# Patient Record
Sex: Male | Born: 1960
Health system: Southern US, Community
[De-identification: ages and names within clinical notes are randomized; demographics above are authoritative.]

## PROBLEM LIST (undated history)

## (undated) DIAGNOSIS — C801 Malignant (primary) neoplasm, unspecified: Secondary | ICD-10-CM

## (undated) DIAGNOSIS — A419 Sepsis, unspecified organism: Secondary | ICD-10-CM

## (undated) DIAGNOSIS — J189 Pneumonia, unspecified organism: Secondary | ICD-10-CM

## (undated) DIAGNOSIS — C9000 Multiple myeloma not having achieved remission: Secondary | ICD-10-CM

## (undated) DIAGNOSIS — K7460 Unspecified cirrhosis of liver: Secondary | ICD-10-CM

## (undated) DIAGNOSIS — C9001 Multiple myeloma in remission: Secondary | ICD-10-CM

## (undated) DIAGNOSIS — Z9484 Stem cells transplant status: Secondary | ICD-10-CM

## (undated) DIAGNOSIS — I10 Essential (primary) hypertension: Secondary | ICD-10-CM

## (undated) DIAGNOSIS — E538 Deficiency of other specified B group vitamins: Secondary | ICD-10-CM

## (undated) DIAGNOSIS — E119 Type 2 diabetes mellitus without complications: Secondary | ICD-10-CM

## (undated) HISTORY — PX: NOSE SURGERY: SHX723

## (undated) HISTORY — DX: Deficiency of other specified B group vitamins: E53.8

## (undated) HISTORY — DX: Essential (primary) hypertension: I10

## (undated) HISTORY — PX: LIMBAL STEM CELL TRANSPLANT: SHX1969

## (undated) HISTORY — DX: Multiple myeloma in remission: C90.01

---

## 2002-10-24 ENCOUNTER — Encounter: Payer: Self-pay | Admitting: Emergency Medicine

## 2002-10-24 ENCOUNTER — Emergency Department (HOSPITAL_COMMUNITY): Admission: EM | Admit: 2002-10-24 | Discharge: 2002-10-24 | Payer: Self-pay | Admitting: Emergency Medicine

## 2011-03-30 ENCOUNTER — Emergency Department (HOSPITAL_COMMUNITY)
Admission: EM | Admit: 2011-03-30 | Discharge: 2011-03-31 | Disposition: A | Discharge status: Home / Self Care | Payer: Self-pay | Attending: Emergency Medicine | Admitting: Emergency Medicine

## 2011-03-30 ENCOUNTER — Encounter (HOSPITAL_COMMUNITY): Payer: Self-pay

## 2011-03-30 DIAGNOSIS — J3489 Other specified disorders of nose and nasal sinuses: Secondary | ICD-10-CM | POA: Insufficient documentation

## 2011-03-30 DIAGNOSIS — J329 Chronic sinusitis, unspecified: Secondary | ICD-10-CM | POA: Insufficient documentation

## 2011-03-30 NOTE — ED Notes (Signed)
Pt presents with nasal congestion and sinus pressure since Sunday. Pt also states nose was bleeding on Tuesday. No bleeding currently.

## 2011-03-31 MED ORDER — SULFAMETHOXAZOLE-TRIMETHOPRIM 800-160 MG PO TABS: 1.0000 | ORAL_TABLET | Freq: Two times a day (BID) | ORAL | Status: AC

## 2011-03-31 NOTE — ED Provider Notes (Deleted)
History     CSN: [id number]  Arrival date & time 03/30/11  2038   First MD Initiated Contact with Patient 03/30/11 2353      Chief Complaint  Patient presents with  . Nasal Congestion    (Consider location/radiation/quality/duration/timing/severity/associated sxs/prior treatment) The history is provided by the patient.  the patient is a 51 year old male, with no significant past medical history, who presents emergency department complaining of sinus pressure, and congestion.  He denies sore throat.  He has not had a fever.  He has not had a rash.  He denies nausea, vomiting, or cough.  History reviewed. No pertinent past medical history.  History reviewed. No pertinent past surgical history.  No family history on file.  History  Substance Use Topics  . Smoking status: Never Smoker   . Smokeless tobacco: Not on file  . Alcohol Use: Yes     occasional      Review of Systems  Constitutional: Negative for fever and chills.  HENT: Positive for congestion and sinus pressure. Negative for ear pain, sore throat, trouble swallowing, neck pain and voice change.   Respiratory: Negative for cough and shortness of breath.   Gastrointestinal: Negative for nausea and vomiting.  Neurological: Positive for headaches.  Psychiatric/Behavioral: Negative for confusion.  All other systems reviewed and are negative.    Allergies  Review of patient's allergies indicates no known allergies.  Home Medications   Current Outpatient Rx  Name Route Sig Dispense Refill  . PSEUDOEPHEDRINE-ACETAMINOPHEN 30-500 MG PO TABS Oral Take 2 tablets by mouth as needed. For cold symptoms    . SULFAMETHOXAZOLE-TRIMETHOPRIM 800-160 MG PO TABS Oral Take 1 tablet by mouth 2 (two) times daily. 20 tablet 0    BP 158/96  Pulse 96  Temp(Src) 98.2 F (36.8 C) (Oral)  Resp 20  Ht 5\' 9"  (1.753 m)  Wt 226 lb (102.513 kg)  BMI 33.37 kg/m2  SpO2 99%  Physical Exam  Vitals reviewed. Constitutional: He is  oriented to person, place, and time. He appears well-developed and well-nourished.  HENT:  Head: Normocephalic and atraumatic.  Mouth/Throat: Oropharynx is clear and moist.       Bilateral maxillary sinus, tenderness  Eyes: Conjunctivae are normal. Pupils are equal, round, and reactive to light.  Neck: Normal range of motion. Neck supple.  Pulmonary/Chest: Effort normal.  Musculoskeletal: Normal range of motion.  Neurological: He is alert and oriented to person, place, and time.  Skin: Skin is warm and dry.  Psychiatric: He has a normal mood and affect.    ED Course  Procedures (including critical care time)  Labs Reviewed - No data to display No results found.   1. Sinusitis       MDM  sinusitis       Cheri Guppy, MD 03/31/11 4098  Cheri Guppy, MD 03/31/11 1191  Cheri Guppy, MD 03/31/11 [phone]

## 2011-03-31 NOTE — ED Provider Notes (Signed)
done  Cheri Guppy, MD 03/31/11 [phone]

## 2011-03-31 NOTE — ED Notes (Signed)
Pt reports sinus pressure since Sunday. Pt has had runny nose, & has noticed some blood when blowing nose. Denies any fever or chest congestion.

## 2011-03-31 NOTE — ED Notes (Signed)
Pt alert & oriented x4, stable gait. Pt given discharge instructions, paperwork & prescription(s). Patient instructed to stop at the registration desk to finish any additional paperwork. pt verbalized understanding. Pt left department w/ no further questions.  

## 2011-05-17 ENCOUNTER — Emergency Department (HOSPITAL_COMMUNITY): Payer: Self-pay

## 2011-05-17 ENCOUNTER — Encounter (HOSPITAL_COMMUNITY): Payer: Self-pay | Admitting: Emergency Medicine

## 2011-05-17 ENCOUNTER — Emergency Department (HOSPITAL_COMMUNITY)
Admission: EM | Admit: 2011-05-17 | Discharge: 2011-05-17 | Disposition: A | Discharge status: Home / Self Care | Payer: Self-pay | Attending: Emergency Medicine | Admitting: Emergency Medicine

## 2011-05-17 DIAGNOSIS — M546 Pain in thoracic spine: Secondary | ICD-10-CM | POA: Insufficient documentation

## 2011-05-17 DIAGNOSIS — E119 Type 2 diabetes mellitus without complications: Secondary | ICD-10-CM | POA: Insufficient documentation

## 2011-05-17 DIAGNOSIS — R079 Chest pain, unspecified: Secondary | ICD-10-CM | POA: Insufficient documentation

## 2011-05-17 DIAGNOSIS — R350 Frequency of micturition: Secondary | ICD-10-CM | POA: Insufficient documentation

## 2011-05-17 DIAGNOSIS — R1011 Right upper quadrant pain: Secondary | ICD-10-CM | POA: Insufficient documentation

## 2011-05-17 DIAGNOSIS — R634 Abnormal weight loss: Secondary | ICD-10-CM | POA: Insufficient documentation

## 2011-05-17 HISTORY — DX: Pneumonia, unspecified organism: J18.9

## 2011-05-17 HISTORY — DX: Sepsis, unspecified organism: A41.9

## 2011-05-17 LAB — COMPREHENSIVE METABOLIC PANEL
ALT: 20 U/L (ref 0–53)
AST: 19 U/L (ref 0–37)
Albumin: 2.4 g/dL — ABNORMAL LOW (ref 3.5–5.2)
Alkaline Phosphatase: 122 U/L — ABNORMAL HIGH (ref 39–117)
BUN: 20 mg/dL (ref 6–23)
Chloride: 94 mEq/L — ABNORMAL LOW (ref 96–112)
Potassium: 3.7 mEq/L (ref 3.5–5.1)
Sodium: 126 mEq/L — ABNORMAL LOW (ref 135–145)
Total Bilirubin: 0.2 mg/dL — ABNORMAL LOW (ref 0.3–1.2)

## 2011-05-17 LAB — DIFFERENTIAL
Basophils Absolute: 0 10*3/uL (ref 0.0–0.1)
Basophils Relative: 0 % (ref 0–1)
Monocytes Relative: 10 % (ref 3–12)
Neutro Abs: 2.9 10*3/uL (ref 1.7–7.7)
Neutrophils Relative %: 50 % (ref 43–77)

## 2011-05-17 LAB — URINALYSIS, ROUTINE W REFLEX MICROSCOPIC
Bilirubin Urine: NEGATIVE
Glucose, UA: >1000 mg/dL — AB
Ketones, ur: NEGATIVE mg/dL
Specific Gravity, Urine: 1.01 (ref 1.005–1.030)
pH: 5.5 (ref 5.0–8.0)

## 2011-05-17 LAB — CBC
Hemoglobin: 9.1 g/dL — ABNORMAL LOW (ref 13.0–17.0)
MCHC: 33.8 g/dL (ref 30.0–36.0)
Platelets: 247 10*3/uL (ref 150–400)
RBC: 2.9 MIL/uL — ABNORMAL LOW (ref 4.22–5.81)

## 2011-05-17 MED ORDER — METFORMIN HCL 500 MG PO TABS: 500.0000 mg | ORAL_TABLET | Freq: Two times a day (BID) | ORAL | Status: DC

## 2011-05-17 MED ORDER — OXYCODONE-ACETAMINOPHEN 5-325 MG PO TABS: 1.0000 | ORAL_TABLET | Freq: Four times a day (QID) | ORAL | Status: AC | PRN

## 2011-05-17 MED ORDER — ONDANSETRON HCL 8 MG PO TABS: 8.0000 mg | ORAL_TABLET | ORAL | Status: AC | PRN

## 2011-05-17 NOTE — ED Notes (Signed)
Dr. Cook in room talking with pt and family  

## 2011-05-17 NOTE — ED Notes (Addendum)
Pt c/o right rib cage pain that increases with movement and respirations, admits to nausea at times, lower back pain and "foam" looking urine and weight loss.  Pt unsure of when the symptoms started. States "it has been awhile"

## 2011-05-17 NOTE — ED Notes (Signed)
Discharge instructions given, verbalized understanding

## 2011-05-17 NOTE — ED Notes (Addendum)
Pt requesting to be weighed, family concerned with amount of weight loss that pt has had since last visit to the er in march, pt previous weight was 226 weight today was 208.2

## 2011-05-17 NOTE — ED Notes (Signed)
Patient with c/o right upper side/rib pain and lower back pain x 2 weeks. Pain increases in ribs with inspiration.

## 2011-05-17 NOTE — Discharge Instructions (Signed)
Blood Sugar Monitoring, Adult GLUCOSE METERS FOR SELF-MONITORING OF BLOOD GLUCOSE  It is important to be able to correctly measure your blood sugar (glucose). You can use a blood glucose monitor (a small battery-operated device) to check your glucose level at any time. This allows you and your caregiver to monitor your diabetes and to determine how well your treatment plan is working. The process of monitoring your blood glucose with a glucose meter is called self-monitoring of blood glucose (SMBG). When people with diabetes control their blood sugar, they have better health. To test for glucose with a typical glucose meter, place the disposable strip in the meter. Then place a small sample of blood on the "test strip." The test strip is coated with chemicals that combine with glucose in blood. The meter measures how much glucose is present. The meter displays the glucose level as a number. Several new models can record and store a number of test results. Some models can connect to personal computers to store test results or print them out.  Newer meters are often easier to use than older models. Some meters allow you to get blood from places other than your fingertip. Some new models have automatic timing, error codes, signals, or barcode readers to help with proper adjustment (calibration). Some meters have a large display screen or spoken instructions for people with visual impairments.  INSTRUCTIONS FOR USING GLUCOSE METERS  Wash your hands with soap and warm water, or clean the area with alcohol. Dry your hands completely.   Prick the side of your fingertip with a lancet (a sharp-pointed tool used by hand).   Hold the hand down and gently milk the finger until a small drop of blood appears. Catch the blood with the test strip.   Follow the instructions for inserting the test strip and using the SMBG meter. Most meters require the meter to be turned on and the test strip to be inserted before  applying the blood sample.   Record the test result.   Read the instructions carefully for both the meter and the test strips that go with it. Meter instructions are found in the user manual. Keep this manual to help you solve any problems that may arise. Many meters use "error codes" when there is a problem with the meter, the test strip, or the blood sample on the strip. You will need the manual to understand these error codes and fix the problem.   New devices are available such as laser lancets and meters that can test blood taken from "alternative sites" of the body, other than fingertips. However, you should use standard fingertip testing if your glucose changes rapidly. Also, use standard testing if:   You have eaten, exercised, or taken insulin in the past 2 hours.   You think your glucose is low.   You tend to not feel symptoms of low blood glucose (hypoglycemia).   You are ill or under stress.   Clean the meter as directed by the manufacturer.   Test the meter for accuracy as directed by the manufacturer.   Take your meter with you to your caregiver's office. This way, you can test your glucose in front of your caregiver to make sure you are using the meter correctly. Your caregiver can also take a sample of blood to test using a routine lab method. If values on the glucose meter are close to the lab results, you and your caregiver will see that your meter is working well  and you are using good technique. Your caregiver will advise you about what to do if the results do not match.  FREQUENCY OF TESTING  Your caregiver will tell you how often you should check your blood glucose. This will depend on your type of diabetes, your current level of diabetes control, and your types of medicines. The following are general guidelines, but your care plan may be different. Record all your readings and the time of day you took them for review with your caregiver.   Diabetes type 1.   When you  are using insulin with good diabetic control (either multiple daily injections or via a pump), you should check your glucose 4 times a day.   If your diabetes is not well controlled, you may need to monitor more frequently, including before meals and 2 hours after meals, at bedtime, and occasionally between 2 a.m. and 3 a.m.   You should always check your glucose before a dose of insulin or before changing the rate on your insulin pump.   Diabetes type 2.   Guidelines for SMBG in diabetes type 2 are not as well defined.   If you are on insulin, follow the guidelines above.   If you are on medicines, but not insulin, and your glucose is not well controlled, you should test at least twice daily.   If you are not on insulin, and your diabetes is controlled with medicines or diet alone, you should test at least once daily, usually before breakfast.   A weekly profile will help your caregiver advise you on your care plan. The week before your visit, check your glucose before a meal and 2 hours after a meal at least daily. You may want to test before and after a different meal each day so you and your caregiver can tell how well controlled your blood sugars are throughout the course of a 24 hour period.   Gestational diabetes (diabetes during pregnancy).   Frequent testing is often necessary. Accurate timing is important.   If you are not on insulin, check your glucose 4 times a day. Check it before breakfast and 1 hour after the start of each meal.   If you are on insulin, check your glucose 6 times a day. Check it before each meal and 1 hour after the first bite of each meal.   General guidelines.   More frequent testing is required at the start of insulin treatment. Your caregiver will instruct you.   Test your glucose any time you suspect you have low blood sugar (hypoglycemia).   You should test more often when you change medicines, when you have unusual stress or illness, or in other  unusual circumstances.  OTHER THINGS TO KNOW ABOUT GLUCOSE METERS  Measurement Range. Most glucose meters are able to read glucose levels over a broad range of values from as low as 0 to as high as 600 mg/dL. If you get an extremely high or low reading from your meter, you should first confirm it with another reading. Report very high or very low readings to your caregiver.   Whole Blood Glucose versus Plasma Glucose. Some older home glucose meters measure glucose in your whole blood. In a lab or when using some newer home glucose meters, the glucose is measured in your plasma (one component of blood). The difference can be important. It is important for you and your caregiver to know whether your meter gives its results as "whole blood equivalent" or "plasma  equivalent."   Display of High and Low Glucose Values. Part of learning how to operate a meter is understanding what the meter results mean. Know how high and low glucose concentrations are displayed on your meter.   Factors that Affect Glucose Meter Performance. The accuracy of your test results depends on many factors and varies depending on the brand and type of meter. These factors include:   Low red blood cell count (anemia).   Substances in your blood (such as uric acid, vitamin C, and others).   Environmental factors (temperature, humidity, altitude).   Name-brand versus generic test strips.   Calibration. Make sure your meter is set up properly. It is a good idea to do a calibration test with a control solution recommended by the manufacturer of your meter whenever you begin using a fresh bottle of test strips. This will help verify the accuracy of your meter.   Improperly stored, expired, or defective test strips. Keep your strips in a dry place with the lid on.   Soiled meter.   Inadequate blood sample.  NEW TECHNOLOGIES FOR GLUCOSE TESTING Alternative site testing Some glucose meters allow testing blood from alternative  sites. These include the:  Upper arm.   Forearm.   Base of the thumb.   Thigh.  Sampling blood from alternative sites may be desirable. However, it may have some limitations. Blood in the fingertips show changes in glucose levels more quickly than blood in other parts of the body. This means that alternative site test results may be different from fingertip test results, not because of the meter's ability to test accurately, but because the actual glucose concentration can be different.  Continuous Glucose Monitoring Devices to measure your blood glucose continuously are available, and others are in development. These methods can be more expensive than self-monitoring with a glucose meter. However, it is uncertain how effective and reliable these devices are. Your caregiver will advise you if this approach makes sense for you. IF BLOOD SUGARS ARE CONTROLLED, PEOPLE WITH DIABETES REMAIN HEALTHIER.  SMBG is an important part of the treatment plan of patients with diabetes mellitus. Below are reasons for using SMBG:   It confirms that your glucose is at a specific, healthy level.   It detects hypoglycemia and severe hyperglycemia.   It allows you and your caregiver to make adjustments in response to changes in lifestyle for individuals requiring medicine.   It determines the need for starting insulin therapy in temporary diabetes that happens during pregnancy (gestational diabetes).  Document Released: 01/11/2003 Document Revised: 12/28/2010 Document Reviewe  d: 05/04/2010 [facility] [employer] Patient Information 2012 [place], [place].  Start medication for diabetes. Medication for pain and nausea. You will need to get a primary care relationship to monitor your diabetes. Recommend starting at [facility] department

## 2011-05-17 NOTE — ED Provider Notes (Signed)
History    This chart was scribed for Donnetta Hutching, MD, MD by Smitty Pluck. The patient was seen in room APA12 and the patient's care was started at 9:11AM.   CSN: [id number]  Arrival date & time 05/17/11  0750   First MD Initiated Contact with Patient 05/17/11 [phone]      Chief Complaint  Patient presents with  . Rib Pain   . Back Pain    (Consider location/radiation/quality/duration/timing/severity/associated sxs/prior treatment) The history is provided by the patient.   John Singleton is a 51 y.o. male who presents to the Emergency Department complaining of moderate right chest and RUQ pain radiating to mid back on right side onset 2-3 weeks. Pt reports that the pain is aggravated by inspiration. Pt reports having weight loss of about 40 lbs over the past month. The symptoms have been constant since onset. Pt's daughter reports that pt has increased urinary frequency and that it has appearance of "foam." Pt denies hx of health problems.   Past Medical History  Diagnosis Date  . Pneumonia   . Sepsis     Past Surgical History  Procedure Date  . Nose surgery     "when I was a child"    No family history on file.  History  Substance Use Topics  . Smoking status: Never Smoker   . Smokeless tobacco: Not on file  . Alcohol Use: Yes     occasional      Review of Systems  All other systems reviewed and are negative.   10 Systems reviewed and all are negative for acute change except as noted in the HPI.   Allergies  Review of patient's allergies indicates no known allergies.  Home Medications   Current Outpatient Rx  Name Route Sig Dispense Refill  . PSEUDOEPHEDRINE-ACETAMINOPHEN 30-500 MG PO TABS Oral Take 2 tablets by mouth as needed. For cold symptoms      BP 157/97  Pulse 107  Temp(Src) 98.8 F (37.1 C) (Oral)  Resp 18  Ht 5\' 7"  (1.702 m)  Wt 226 lb (102.513 kg)  BMI 35.40 kg/m2  SpO2 100%  Physical Exam  Nursing note and vitals  reviewed. Constitutional: He is oriented to person, place, and time. He appears well-developed and well-nourished. No distress.  HENT:  Head: Normocephalic and atraumatic.  Eyes: Conjunctivae are normal. Pupils are equal, round, and reactive to light.  Neck: Normal range of motion. No thyromegaly present.  Pulmonary/Chest: Effort normal. No respiratory distress. He exhibits tenderness (Right lower).  Abdominal: Soft. He exhibits no distension. There is tenderness (RUQ).  Neurological: He is alert and oriented to person, place, and time.  Skin: Skin is warm and dry.  Psychiatric: He has a normal mood and affect. His behavior is normal.    ED Course  Procedures (including critical care time) DIAGNOSTIC STUDIES: Oxygen Saturation is 100% on room air, normal by my interpretation.    COORDINATION OF CARE: 9:24AM EDP discusses pt ED treatment course with pt: Blood labs and xray. Possible diabetes mellitus     Labs Reviewed  CBC - Abnormal; Notable for the following:    RBC 2.90 (*)    Hemoglobin 9.1 (*)    HCT 26.9 (*)    All other components within normal limits  COMPREHENSIVE METABOLIC PANEL - Abnormal; Notable for the following:    Sodium 126 (*)    Chloride 94 (*)    Glucose, Bld 362 (*)    Total Protein 9.3 (*)  Albumin 2.4 (*)    Alkaline Phosphatase 122 (*)    Total Bilirubin 0.2 (*)    GFR calc non Af Amer 81 (*)    All other components within normal limits  LIPASE, BLOOD - Abnormal; Notable for the following:    Lipase 80 (*)    All other components within normal limits  URINALYSIS, ROUTINE W REFLEX MICROSCOPIC - Abnormal; Notable for the following:    Glucose, UA >1000 (*)    Hgb urine dipstick TRACE (*)    Protein, ur TRACE (*)    All other components within normal limits  DIFFERENTIAL  URINE MICROSCOPIC-ADD ON   Dg Chest 2 View  05/17/2011  *RADIOLOGY REPORT*  Clinical Data: Weight loss.  Right side abdominal pain.  CHEST - 2 VIEW  Comparison: PA and lateral  chest 03/21/2003.  Findings: Lungs are clear.  Heart size is normal.  No pneumothorax or effusion.  Remote left fourth and fifth rib fractures noted.  IMPRESSION: No acute finding.  Original Report Authenticated By: Bernadene Bell. Maricela Curet, M.D.   US Abdomen Complete  05/17/2011  *RADIOLOGY REPORT*  Clinical Data:  Right upper quadrant pain.  COMPLETE ABDOMINAL ULTRASOUND  Comparison:  None.  Findings:  Gallbladder:  No gallstones, gallbladder wall thickening, or pericholecystic fluid.  Common bile duct:  Measures 0.3 cm.  Liver:  Diffuse fatty infiltration of the liver with focal sparing noted about the gallbladder. The liver measures 13.6 cm. No worrisome lesion or intrahepatic biliary ductal dilatation.  IVC:  Appears normal.  Pancreas:  No focal abnormality seen.  Spleen:  Measures 9.4 cm and appears normal.  Right Kidney:  Measures 13.6 cm and appears normal without stone, mass or hydronephrosis.  Left Kidney:  Measures 13.6 cm and appears normal.  Abdominal aorta:  No aneurysm identified.  IMPRESSION: No acute finding.  Fatty infiltration of the liver and hepatomegaly.  Original Report Authenticated By: Bernadene Bell. Maricela Curet, M.D.     No diagnosis found.    MDM  Patient complains of polyuria, polydipsia, polyphasia, weight loss. Glucose greater than 300. Not in DKA. Elevated lipase also noted. Ultrasound shows fatty liver but no gallbladder pathology. Will start low dose of metformin and referred patient to [facility] department. This was discussed with patient and his family  I personally performed the services described in this documentation, which was scribed in my presence. The recorded information has been reviewed and considered.        Donnetta Hutching, MD 05/17/11 1451

## 2011-09-30 DIAGNOSIS — R079 Chest pain, unspecified: Secondary | ICD-10-CM

## 2011-10-01 DIAGNOSIS — R079 Chest pain, unspecified: Secondary | ICD-10-CM

## 2011-10-05 ENCOUNTER — Encounter: Payer: Self-pay | Admitting: Internal Medicine

## 2011-10-05 DIAGNOSIS — C9000 Multiple myeloma not having achieved remission: Secondary | ICD-10-CM

## 2011-10-05 DIAGNOSIS — Z5112 Encounter for antineoplastic immunotherapy: Secondary | ICD-10-CM

## 2011-10-12 ENCOUNTER — Encounter: Payer: Self-pay | Admitting: Internal Medicine

## 2011-10-12 DIAGNOSIS — Z5112 Encounter for antineoplastic immunotherapy: Secondary | ICD-10-CM

## 2011-10-12 DIAGNOSIS — D649 Anemia, unspecified: Secondary | ICD-10-CM

## 2011-10-12 DIAGNOSIS — N183 Chronic kidney disease, stage 3 (moderate): Secondary | ICD-10-CM

## 2011-10-12 DIAGNOSIS — C9000 Multiple myeloma not having achieved remission: Secondary | ICD-10-CM

## 2011-10-19 DIAGNOSIS — Z5112 Encounter for antineoplastic immunotherapy: Secondary | ICD-10-CM

## 2011-10-19 DIAGNOSIS — C9000 Multiple myeloma not having achieved remission: Secondary | ICD-10-CM

## 2011-10-23 ENCOUNTER — Encounter: Payer: Self-pay | Admitting: Internal Medicine

## 2011-10-23 DIAGNOSIS — G893 Neoplasm related pain (acute) (chronic): Secondary | ICD-10-CM

## 2011-10-23 DIAGNOSIS — C9000 Multiple myeloma not having achieved remission: Secondary | ICD-10-CM

## 2011-10-23 DIAGNOSIS — N189 Chronic kidney disease, unspecified: Secondary | ICD-10-CM

## 2011-11-02 DIAGNOSIS — C9000 Multiple myeloma not having achieved remission: Secondary | ICD-10-CM

## 2011-11-02 DIAGNOSIS — Z5112 Encounter for antineoplastic immunotherapy: Secondary | ICD-10-CM

## 2011-11-09 DIAGNOSIS — Z5112 Encounter for antineoplastic immunotherapy: Secondary | ICD-10-CM

## 2011-11-09 DIAGNOSIS — C9000 Multiple myeloma not having achieved remission: Secondary | ICD-10-CM

## 2011-11-13 DIAGNOSIS — C9000 Multiple myeloma not having achieved remission: Secondary | ICD-10-CM

## 2011-11-13 DIAGNOSIS — D696 Thrombocytopenia, unspecified: Secondary | ICD-10-CM

## 2011-11-13 DIAGNOSIS — Z23 Encounter for immunization: Secondary | ICD-10-CM

## 2011-11-16 DIAGNOSIS — C9000 Multiple myeloma not having achieved remission: Secondary | ICD-10-CM

## 2011-11-16 DIAGNOSIS — Z5112 Encounter for antineoplastic immunotherapy: Secondary | ICD-10-CM

## 2011-12-03 ENCOUNTER — Encounter: Payer: Self-pay | Admitting: Internal Medicine

## 2011-12-03 DIAGNOSIS — Z5112 Encounter for antineoplastic immunotherapy: Secondary | ICD-10-CM

## 2011-12-03 DIAGNOSIS — C9000 Multiple myeloma not having achieved remission: Secondary | ICD-10-CM

## 2011-12-11 DIAGNOSIS — Z5112 Encounter for antineoplastic immunotherapy: Secondary | ICD-10-CM

## 2011-12-11 DIAGNOSIS — C9000 Multiple myeloma not having achieved remission: Secondary | ICD-10-CM

## 2011-12-17 DIAGNOSIS — C9000 Multiple myeloma not having achieved remission: Secondary | ICD-10-CM

## 2011-12-17 DIAGNOSIS — Z5112 Encounter for antineoplastic immunotherapy: Secondary | ICD-10-CM

## 2011-12-24 ENCOUNTER — Encounter: Payer: Self-pay | Admitting: Internal Medicine

## 2011-12-24 DIAGNOSIS — C9000 Multiple myeloma not having achieved remission: Secondary | ICD-10-CM

## 2011-12-24 DIAGNOSIS — Z5112 Encounter for antineoplastic immunotherapy: Secondary | ICD-10-CM

## 2011-12-31 DIAGNOSIS — Z5112 Encounter for antineoplastic immunotherapy: Secondary | ICD-10-CM

## 2011-12-31 DIAGNOSIS — C9000 Multiple myeloma not having achieved remission: Secondary | ICD-10-CM

## 2012-01-08 DIAGNOSIS — C9000 Multiple myeloma not having achieved remission: Secondary | ICD-10-CM

## 2012-01-08 DIAGNOSIS — Z5112 Encounter for antineoplastic immunotherapy: Secondary | ICD-10-CM

## 2012-01-09 ENCOUNTER — Emergency Department (HOSPITAL_COMMUNITY): Payer: Medicaid Other

## 2012-01-09 ENCOUNTER — Emergency Department (HOSPITAL_COMMUNITY)
Admission: EM | Admit: 2012-01-09 | Discharge: 2012-01-09 | Disposition: A | Discharge status: Home / Self Care | Payer: Medicaid Other | Attending: Emergency Medicine | Admitting: Emergency Medicine

## 2012-01-09 ENCOUNTER — Encounter (HOSPITAL_COMMUNITY): Payer: Self-pay | Admitting: *Deleted

## 2012-01-09 DIAGNOSIS — R05 Cough: Secondary | ICD-10-CM | POA: Insufficient documentation

## 2012-01-09 DIAGNOSIS — E119 Type 2 diabetes mellitus without complications: Secondary | ICD-10-CM | POA: Insufficient documentation

## 2012-01-09 DIAGNOSIS — Z8619 Personal history of other infectious and parasitic diseases: Secondary | ICD-10-CM | POA: Insufficient documentation

## 2012-01-09 DIAGNOSIS — Z8701 Personal history of pneumonia (recurrent): Secondary | ICD-10-CM | POA: Insufficient documentation

## 2012-01-09 DIAGNOSIS — R059 Cough, unspecified: Secondary | ICD-10-CM | POA: Insufficient documentation

## 2012-01-09 DIAGNOSIS — Z859 Personal history of malignant neoplasm, unspecified: Secondary | ICD-10-CM | POA: Insufficient documentation

## 2012-01-09 DIAGNOSIS — C9000 Multiple myeloma not having achieved remission: Secondary | ICD-10-CM | POA: Insufficient documentation

## 2012-01-09 DIAGNOSIS — J029 Acute pharyngitis, unspecified: Secondary | ICD-10-CM | POA: Insufficient documentation

## 2012-01-09 DIAGNOSIS — R062 Wheezing: Secondary | ICD-10-CM | POA: Insufficient documentation

## 2012-01-09 DIAGNOSIS — J9801 Acute bronchospasm: Secondary | ICD-10-CM | POA: Insufficient documentation

## 2012-01-09 DIAGNOSIS — Z79899 Other long term (current) drug therapy: Secondary | ICD-10-CM | POA: Insufficient documentation

## 2012-01-09 HISTORY — DX: Multiple myeloma not having achieved remission: C90.00

## 2012-01-09 HISTORY — DX: Malignant (primary) neoplasm, unspecified: C80.1

## 2012-01-09 HISTORY — DX: Type 2 diabetes mellitus without complications: E11.9

## 2012-01-09 MED ORDER — ALBUTEROL SULFATE (5 MG/ML) 0.5% IN NEBU
2.5000 mg | INHALATION_SOLUTION | Freq: Once | RESPIRATORY_TRACT | Status: AC
  Administered 2012-01-09: 2.5 mg via RESPIRATORY_TRACT
  Filled 2012-01-09: qty 0.5

## 2012-01-09 MED ORDER — ALBUTEROL SULFATE HFA 108 (90 BASE) MCG/ACT IN AERS: 1.0000 | INHALATION_SPRAY | Freq: Four times a day (QID) | RESPIRATORY_TRACT | Status: DC | PRN

## 2012-01-09 MED ORDER — PREDNISONE 50 MG PO TABS
60.0000 mg | ORAL_TABLET | Freq: Once | ORAL | Status: AC
  Administered 2012-01-09: 60 mg via ORAL
  Filled 2012-01-09: qty 1

## 2012-01-09 NOTE — ED Notes (Signed)
Pt reporting sore throat and difficulty catching breath.  Reports symptoms began about 1 hour ago.  Denies congestion.  Occasional non-productive cough.

## 2012-01-09 NOTE — ED Provider Notes (Signed)
History     CSN: [id number]  Arrival date & time 01/09/12  0006   First MD Initiated Contact with Patient 01/09/12 0036      Chief Complaint  Patient presents with  . Shortness of Breath    (Consider location/radiation/quality/duration/timing/severity/associated sxs/prior treatment) HPI John Singleton is a 51 y.o. male who presents to the Emergency Department complaining of wheezing and shortness of breath that began an hour ago. Now with sore throat. He does not smoke and has not been exposed to any allergen that he is aware. Denies fever, chills, nausea, vomiting.   PCP Dr. Regino Schultze   Past Medical History  Diagnosis Date  . Pneumonia   . Sepsis(995.91)   . Cancer   . Multiple myeloma   . Diabetes mellitus without complication     Past Surgical History  Procedure Date  . Nose surgery     "when I was a child"    History reviewed. No pertinent family history.  History  Substance Use Topics  . Smoking status: Never Smoker   . Smokeless tobacco: Not on file  . Alcohol Use: Yes     Comment: occasional      Review of Systems  Constitutional: Negative for fever.       10 Systems reviewed and are negative for acute change except as noted in the HPI.  HENT: Negative for congestion.   Eyes: Negative for discharge and redness.  Respiratory: Positive for cough, shortness of breath and wheezing.   Cardiovascular: Negative for chest pain.  Gastrointestinal: Negative for vomiting and abdominal pain.  Musculoskeletal: Negative for back pain.  Skin: Negative for rash.  Neurological: Negative for syncope, numbness and headaches.  Psychiatric/Behavioral:       No behavior change.    Allergies  Review of patient's allergies indicates no known allergies.  Home Medications   Current Outpatient Rx  Name  Route  Sig  Dispense  Refill  . ACYCLOVIR 200 MG PO CAPS   Oral   Take by mouth 2 (two) times daily.         Marland Kitchen DEXAMETHASONE 4 MG PO TABS   Oral   Take 4 mg by  mouth as directed.         Marland Kitchen LENALIDOMIDE 15 MG PO CAPS   Oral   Take 15 mg by mouth daily.         Marland Kitchen METFORMIN HCL 500 MG PO TABS   Oral   Take 1 tablet (500 mg total) by mouth 2 (two) times daily with a meal.   60 tablet   0   . PSEUDOEPHEDRINE-ACETAMINOPHEN 30-500 MG PO TABS   Oral   Take 2 tablets by mouth as needed. For cold symptoms           BP 138/79  Pulse 109  Temp 98.8 F (37.1 C) (Oral)  Resp 18  Ht 5\' 9"  (1.753 m)  Wt 214 lb (97.07 kg)  BMI 31.60 kg/m2  SpO2 97%  Physical Exam  Nursing note and vitals reviewed. Constitutional: He appears well-developed and well-nourished.       Awake, alert, nontoxic appearance.  HENT:  Head: Atraumatic.  Eyes: Right eye exhibits no discharge. Left eye exhibits no discharge.  Neck: Neck supple.  Cardiovascular: Normal heart sounds.   Pulmonary/Chest: Effort normal. No respiratory distress. He has wheezes. He exhibits no tenderness.       Dry cough  Abdominal: Soft. There is no tenderness. There is no rebound.  Musculoskeletal: He  exhibits no tenderness.       Baseline ROM, no obvious new focal weakness.  Neurological:       Mental status and motor strength appears baseline for patient and situation.  Skin: No rash noted.  Psychiatric: He has a normal mood and affect.    ED Course  Procedures (including critical care time)  Dg Chest 2 View  01/09/2012  *RADIOLOGY REPORT*  Clinical Data: Shortness of breath  CHEST - 2 VIEW  Comparison: 09/30/2011 CT performed at [facility].  Findings: Cardiomediastinal contours within normal range.  Lungs are clear.  No pleural effusion or pneumothorax.  Lytic osseous lesions better characterized on prior CT. There is sequelae of pathologic rib fractures. Some show callus formation.  Some fracture lines are still evident.  IMPRESSION: No radiographic evidence of acute cardiopulmonary process.  Known osseous lesions and pathologic rib fractures as characterized on  recent prior CT.   Original Report Authenticated By: Jearld Lesch, M.D.      MDM  Patient with sore throat and shortness of breath that began an hour before coming to the ER. PE with wheezing. Given albuterol nebulized treatment with resolution of the wheezing. Initiated steroid therapy.  Pt feels improved after observation and/or treatment in ED.Pt stable in ED with no significant deterioration in condition.The patient appears reasonably screened and/or stabilized for discharge and I doubt any other medical condition or other [facility] requiring further screening, evaluation, or treatment in the ED at this time prior to discharge.        Nicoletta Dress. Colon Branch, MD 01/11/12 4540

## 2012-01-21 ENCOUNTER — Encounter: Payer: Self-pay | Admitting: Internal Medicine

## 2012-01-21 DIAGNOSIS — C9000 Multiple myeloma not having achieved remission: Secondary | ICD-10-CM

## 2012-02-08 ENCOUNTER — Encounter: Payer: Self-pay | Admitting: Internal Medicine

## 2012-02-08 DIAGNOSIS — C9000 Multiple myeloma not having achieved remission: Secondary | ICD-10-CM

## 2012-03-04 DIAGNOSIS — C9000 Multiple myeloma not having achieved remission: Secondary | ICD-10-CM

## 2012-04-01 ENCOUNTER — Encounter: Payer: Self-pay | Admitting: Internal Medicine

## 2012-04-01 DIAGNOSIS — C9000 Multiple myeloma not having achieved remission: Secondary | ICD-10-CM

## 2012-04-08 ENCOUNTER — Encounter (HOSPITAL_COMMUNITY): Payer: Self-pay

## 2012-04-08 ENCOUNTER — Emergency Department (HOSPITAL_COMMUNITY): Payer: Medicaid Other

## 2012-04-08 ENCOUNTER — Emergency Department (HOSPITAL_COMMUNITY)
Admission: EM | Admit: 2012-04-08 | Discharge: 2012-04-08 | Disposition: A | Discharge status: Home / Self Care | Payer: Medicaid Other | Attending: Emergency Medicine | Admitting: Emergency Medicine

## 2012-04-08 DIAGNOSIS — Z8619 Personal history of other infectious and parasitic diseases: Secondary | ICD-10-CM | POA: Insufficient documentation

## 2012-04-08 DIAGNOSIS — R0982 Postnasal drip: Secondary | ICD-10-CM | POA: Insufficient documentation

## 2012-04-08 DIAGNOSIS — J40 Bronchitis, not specified as acute or chronic: Secondary | ICD-10-CM

## 2012-04-08 DIAGNOSIS — Z8701 Personal history of pneumonia (recurrent): Secondary | ICD-10-CM | POA: Insufficient documentation

## 2012-04-08 DIAGNOSIS — E119 Type 2 diabetes mellitus without complications: Secondary | ICD-10-CM | POA: Insufficient documentation

## 2012-04-08 DIAGNOSIS — J3489 Other specified disorders of nose and nasal sinuses: Secondary | ICD-10-CM | POA: Insufficient documentation

## 2012-04-08 DIAGNOSIS — Z87898 Personal history of other specified conditions: Secondary | ICD-10-CM | POA: Insufficient documentation

## 2012-04-08 DIAGNOSIS — Z859 Personal history of malignant neoplasm, unspecified: Secondary | ICD-10-CM | POA: Insufficient documentation

## 2012-04-08 DIAGNOSIS — J209 Acute bronchitis, unspecified: Secondary | ICD-10-CM | POA: Insufficient documentation

## 2012-04-08 DIAGNOSIS — Z79899 Other long term (current) drug therapy: Secondary | ICD-10-CM | POA: Insufficient documentation

## 2012-04-08 LAB — GLUCOSE, CAPILLARY: Glucose-Capillary: 233 mg/dL — ABNORMAL HIGH (ref 70–99)

## 2012-04-08 MED ORDER — MOXIFLOXACIN HCL 400 MG PO TABS: 400.0000 mg | ORAL_TABLET | Freq: Every day | ORAL | Status: DC

## 2012-04-08 NOTE — ED Provider Notes (Signed)
History  This chart was scribed for [name], MD by Bennett Scrape, ED Scribe. This patient was seen in room APA11/APA11 and the patient's care was started at 10:41 AM.  CSN: [id number]  Arrival date & time 04/08/12  1036   First MD Initiated Contact with Patient 04/08/12 1041      Chief Complaint  Patient presents with  . Cough  . Nasal Congestion     Patient is a 52 y.o. male presenting with cough. The history is provided by the patient. No language interpreter was used.  Cough Cough characteristics:  Non-productive Onset quality:  Gradual Duration:  7 days Timing:  Constant Progression:  Worsening Chronicity:  New Smoker: no   Associated symptoms: no chest pain, no chills, no eye discharge, no fever, no headaches, no rash and no shortness of breath     John Singleton is a 52 y.o. male with a h/o multiple myeloma currently being treated by [facility] who presents to the Emergency Department complaining of 7 days of gradual onset, gradually worsening, constant cough with associated post nasal drip and congestion. He reports that the cough is worse at night and is not improved with his albuterol inhaler or Tylenol sinus. He denies fever and sore throat as associated symptoms. He is also currently on acyclovir to prevent shingles, albuterol inhalers and glucophage for DM. He states CBG was 130 2 days ago. He is an occasional alcohol user but denies smoking.   Past Medical History  Diagnosis Date  . Pneumonia   . Sepsis(995.91)   . Cancer   . Multiple myeloma   . Diabetes mellitus without complication     Past Surgical History  Procedure Laterality Date  . Nose surgery      "when I was a child"    No family history on file.  History  Substance Use Topics  . Smoking status: Never Smoker   . Smokeless tobacco: Not on file  . Alcohol Use: Yes     Comment: occasional      Review of Systems  Constitutional: Negative for fever, chills and fatigue.  HENT:  Positive for congestion and postnasal drip. Negative for sinus pressure and ear discharge.   Eyes: Negative for discharge.  Respiratory: Positive for cough. Negative for shortness of breath.   Cardiovascular: Negative for chest pain.  Gastrointestinal: Negative for abdominal pain and diarrhea.  Genitourinary: Negative for frequency and hematuria.  Musculoskeletal: Negative for back pain.  Skin: Negative for rash.  Neurological: Negative for seizures and headaches.  Psychiatric/Behavioral: Negative for hallucinations.    Allergies  Review of patient's allergies indicates no known allergies.  Home Medications   Current Outpatient Rx  Name  Route  Sig  Dispense  Refill  . acyclovir (ZOVIRAX) 200 MG capsule   Oral   Take by mouth 2 (two) times daily.         Marland Kitchen albuterol (PROVENTIL HFA;VENTOLIN HFA) 108 (90 BASE) MCG/ACT inhaler   Inhalation   Inhale 1-2 puffs into the lungs every 6 (six) hours as needed for wheezing.   1 Inhaler   0   . dexamethasone (DECADRON) 4 MG tablet   Oral   Take 4 mg by mouth as directed.         Marland Kitchen lenalidomide (REVLIMID) 15 MG capsule   Oral   Take 15 mg by mouth daily.         . metFORMIN (GLUCOPHAGE) 500 MG tablet   Oral   Take 1 tablet (  500 mg total) by mouth 2 (two) times daily with a meal.   60 tablet   0   . pseudoephedrine-acetaminophen (TYLENOL SINUS) 30-500 MG TABS   Oral   Take 2 tablets by mouth as needed. For cold symptoms           Triage Vitals: BP 178/88  Pulse 94  Temp(Src) 98.1 F (36.7 C) (Oral)  Resp 22  Ht 5\' 9"  (1.753 m)  Wt 214 lb (97.07 kg)  BMI 31.59 kg/m2  SpO2 99%  Physical Exam  Nursing note and vitals reviewed. Constitutional: He is oriented to person, place, and time. He appears well-developed and well-nourished.  HENT:  Head: Normocephalic and atraumatic.  Eyes: Conjunctivae and EOM are normal. No scleral icterus.  Neck: Neck supple. No thyromegaly present.  Cardiovascular: Normal rate and  regular rhythm.  Exam reveals no gallop and no friction rub.   No murmur heard. Pulmonary/Chest: Effort normal and breath sounds normal. No stridor. He has no wheezes. He has no rales. He exhibits no tenderness.  Abdominal: He exhibits no distension. There is no tenderness. There is no rebound.  Musculoskeletal: Normal range of motion. He exhibits no edema.  Lymphadenopathy:    He has no cervical adenopathy.  Neurological: He is alert and oriented to person, place, and time. Coordination normal.  Skin: Skin is warm and dry. No rash noted. No erythema.  Psychiatric: He has a normal mood and affect. His behavior is normal.    ED Course  Procedures (including critical care time)  DIAGNOSTIC STUDIES: Oxygen Saturation is 99% on room air, normal by my interpretation.    COORDINATION OF CARE: 10:51 AM-Discussed treatment plan which includes CXR with pt at bedside and pt agreed to plan.   12:19 PM-Informed pt of radiology and lab work results. Discussed discharge plan with pt and pt agreed to plan. Also advised pt to follow up and pt agreed.  Results for orders placed during the hospital encounter of 04/08/12  GLUCOSE, CAPILLARY      Result Value Range   Glucose-Capillary 233 (*) 70 - 99 mg/dL   Dg Chest 2 View  1/61/0960  *RADIOLOGY REPORT*  Clinical Data: 1-week history of coughing.  History of multiple myeloma.  CHEST - 2 VIEW  Comparison: 01/09/2012.  Findings: Cardiac silhouette is normal size and shape. Aortic ectasia is present.  No pulmonary infiltrates or masses were evident.  No pulmonary edema, pneumonia, or pleural effusion is seen.  A vague retrosternal density on lateral image is unchanged from prior study.  There is some central peribronchial thickening. There is history of pathological rib fractures.  No definite acute fracture is seen.  No pneumothorax evident.  IMPRESSION: No pulmonary edema, consolidation, or pleural effusion.  There is some central peribronchial thickening.   This may be associated with bronchitis, asthma, and reactive airway disease.   Original Report Authenticated By: Onalee Hua Call      No diagnosis found.    MDM        The chart was scribed for me under my direct supervision.  I personally performed the history, physical, and medical decision making and all procedures in the evaluation of this patient.[name], MD 04/08/12 1230

## 2012-04-08 NOTE — ED Notes (Signed)
Pt reports cough and congestion for 1 week, no fever.

## 2012-05-22 ENCOUNTER — Encounter: Payer: Medicaid Other | Admitting: Internal Medicine

## 2012-05-22 DIAGNOSIS — C9000 Multiple myeloma not having achieved remission: Secondary | ICD-10-CM

## 2012-05-28 DIAGNOSIS — C9000 Multiple myeloma not having achieved remission: Secondary | ICD-10-CM

## 2012-08-07 DIAGNOSIS — Z9484 Stem cells transplant status: Secondary | ICD-10-CM | POA: Insufficient documentation

## 2012-08-18 DIAGNOSIS — T8089XA Other complications following infusion, transfusion and therapeutic injection, initial encounter: Secondary | ICD-10-CM | POA: Insufficient documentation

## 2012-09-18 ENCOUNTER — Encounter (INDEPENDENT_AMBULATORY_CARE_PROVIDER_SITE_OTHER): Payer: Medicaid Other

## 2012-09-18 DIAGNOSIS — C9000 Multiple myeloma not having achieved remission: Secondary | ICD-10-CM

## 2012-10-13 ENCOUNTER — Telehealth (HOSPITAL_COMMUNITY): Payer: Self-pay | Admitting: Dietician

## 2012-10-13 NOTE — Telephone Encounter (Signed)
Received message from [place] from [facility] left on 10/10/12 at 1522. Pt needs appointment for dx: prediabetes, high cholesterol.

## 2012-10-13 NOTE — Telephone Encounter (Signed)
Called Angela at 1044. Appointment scheduled for 10/27/12 at 1400.

## 2012-10-23 ENCOUNTER — Encounter (INDEPENDENT_AMBULATORY_CARE_PROVIDER_SITE_OTHER): Payer: Medicaid Other

## 2012-10-23 DIAGNOSIS — R946 Abnormal results of thyroid function studies: Secondary | ICD-10-CM

## 2012-10-23 DIAGNOSIS — C9000 Multiple myeloma not having achieved remission: Secondary | ICD-10-CM

## 2012-10-27 ENCOUNTER — Encounter (HOSPITAL_COMMUNITY): Payer: Self-pay | Admitting: Dietician

## 2012-10-27 DIAGNOSIS — I10 Essential (primary) hypertension: Secondary | ICD-10-CM | POA: Insufficient documentation

## 2012-10-27 NOTE — Progress Notes (Signed)
Outpatient Initial Nutrition Assessment  Date:10/27/2012   Appt Start Time: 1354  Referring Physician: [facility] Reason for Visit: diabetes, high cholesterol  Nutrition Assessment:  Height: 5\' 8"  (172.7 cm)   Weight: 240 lb (108.863 kg)   IBW: 154# %IBW: 156% UBW: 240# %UBW: 100% Body mass index is 36.5 kg/(m^2). Meets criteria for obesity, class II.  Goal Weight: 216# (10% wt loss of current body weight) Weight hx: Pt reports lowest weight of 202# in September 2013, due to cancer treatments and extended hospitalizations.   Estimated nutritional needs:  Kcals/ day: 1900-2000 Protein (grams)/day: 87-109 Fluid (L)/ day: 1.9-2.1  PMH:  Past Medical History  Diagnosis Date  . Pneumonia   . Sepsis(995.91)   . Cancer   . Multiple myeloma   . Diabetes mellitus without complication   . HTN (hypertension)     Medications:  Current Outpatient Rx  Name  Route  Sig  Dispense  Refill  . acyclovir (ZOVIRAX) 200 MG capsule   Oral   Take by mouth 2 (two) times daily.         . cholecalciferol (VITAMIN D) 1000 UNITS tablet   Oral   Take 1,000 Units by mouth daily.         . folic acid (FOLVITE) 1 MG tablet   Oral   Take 1 mg by mouth daily.         . metFORMIN (GLUCOPHAGE) 500 MG tablet   Oral   Take 500 mg by mouth 2 (two) times daily with a meal.         . Multiple Vitamin (MULTIVITAMIN WITH MINERALS) TABS tablet   Oral   Take 1 tablet by mouth daily.         . ramipril (ALTACE) 5 MG capsule   Oral   Take 5 mg by mouth daily.         Marland Kitchen sulfamethoxazole-trimethoprim (BACTRIM DS) 800-160 MG per tablet   Oral   Take 1 tablet by mouth once.         Marland Kitchen dexamethasone (DECADRON) 4 MG tablet   Oral   Take 4 mg by mouth as directed.         Marland Kitchen lenalidomide (REVLIMID) 15 MG capsule   Oral   Take 15 mg by mouth daily.         Marland Kitchen EXPIRED: metFORMIN (GLUCOPHAGE) 500 MG tablet   Oral   Take 1 tablet (500 mg total) by mouth 2 (two) times daily with a meal.   60  tablet   0   . moxifloxacin (AVELOX) 400 MG tablet   Oral   Take 1 tablet (400 mg total) by mouth daily.   7 tablet   0   . pseudoephedrine-acetaminophen (TYLENOL SINUS) 30-500 MG TABS   Oral   Take 2 tablets by mouth as needed. For cold symptoms           Labs: CMP     Component Value Date/Time   NA 126* 05/17/2011 0944   K 3.7 05/17/2011 0944   CL 94* 05/17/2011 0944   CO2 25 05/17/2011 0944   GLUCOSE 362* 05/17/2011 0944   BUN 20 05/17/2011 0944   CREATININE 1.05 05/17/2011 0944   CALCIUM 9.2 05/17/2011 0944   PROT 9.3* 05/17/2011 0944   ALBUMIN 2.4* 05/17/2011 0944   AST 19 05/17/2011 0944   ALT 20 05/17/2011 0944   ALKPHOS 122* 05/17/2011 0944   BILITOT 0.2* 05/17/2011 0944   GFRNONAA 81* 05/17/2011 0944  GFRAA >90 05/17/2011 0944    Lipid Panel  No results found for this basename: chol, trig, hdl, cholhdl, vldl, ldlcalc     No results found for this basename: HGBA1C   Lab Results  Component Value Date   CREATININE 1.05 05/17/2011     Lifestyle/ social habits: John Singleton is a very pleasant gentleman who resides in [place]. He is divorced; his 80 year old daughter lives with him and has been assisting him since undergoing cancer treatments over one year ago. He also has a 10 year old son who lives in [place] and a 72 year old daughter with whom he shares custody with his ex-wife. 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 He reports no stress in his life. He has a very supportive family. He reports that he started walking 45-60 minutes at least 5 times per day around [facility] mall in August 2014.   Nutrition hx/habits: John Singleton reports that he proactively started changing his lifestyle habits about one month ago. Prior to that, he reported weakness and debility due to cancer treatments and extended hospitalizations (he reported a 2 week hospital stay s/p a stem cell transplant at [facility]). He reports his cancer is  in remission. He reports that due to decreased appetite and hospitalizations, he lost 40# over a short period of time last year. He reports he weighed 202# back in September 203#.  He reports that in the the past month, he has eliminated sods from his diet and drinks mainly water and unsweetened tea. He tries to watch what he eats and has been eating lean cuts of meat and more fruits and vegetables. He still eats out 2-3 times per week, but will order a salad or a chicken sandwich. He has drastically decreased his red meat consumption. He wonders if splenda is ok for him to drink.  He currently checks CBGs two times a day (before dinner and AM fasting); readings range between 120's and 130's. He reports that levels were in the 300's when going through cancer treatments, due to steroids. He verbalizes a strong desire to be healthy.   Diet recall: Breakfast (9:30 AM): bowl of raisin bran cereal; Lunch (1:00 PM): pinot beans and corn bread from [employer]; Dinner (7:00 PM): spaghetti OR chicken alfredo. Beverages consist mostly of water and unsweetened tea.   Nutrition Diagnosis: Nutrition-related knowledge deficit r/t no previous diabetes education AEB Hgb A1c: 6.3 (diabetes dx last year).   Nutrition Intervention: Nutrition rx: 1600 kcal NAS, diabetic diet; 3 meals per day (limit 1 starch per meal); no snacks; low calorie beverages only; 2.5 hours physical activity per week at minimum  Education/Counseling Provided: Educated pt on principles of diabetic diet. Reviewed labs results with pt.  Discussed carbohydrate metabolism in relation to diabetes. Educated pt on basic self-management principles including: signs and symptoms of hyperglycemia and hypoglycemia, goals for fasting and postprandial blood sugars, goals for Hgb A1c, importance of checking feet, importance of keeping PCP appointments, and foot care. Educated pt on plate method, portion sizes, and sources of carbohydrate. Discussed importance of regular  meal pattern. Discussed importance of adding sources of whole grains to diet to improve glycemic control. Also encouraged to choose low fat dairy, lean meats, and whole fruits and vegetables more often. Also discussed importance of additions of whole grains, lean meats, fruit, vegetables, and achieving a healthy weight to improve cholesterol readings. Discussed ways to decrease sodium, fat, and carbohydrate in diet.  Discussed ways to make better choices when dining out. Discussed options of artificial sweeteners and encouraged pt to use which brand he liked best. Discussed nutritional content of foods commonly eaten and discussed healthier alternatives. Discussed importance of compliance to prevent further complications of disease. Educated pt on importance of physical activity (goal of at least 30 minutes 5 times per week) along with a healthy diet to achieve weight loss and glycemic goals. Encouraged slow, moderate weight loss of 1-2# per week, or 7-10% of current body weight. Provided plate method handouts. Used Te[employer] to assess understanding.   Understanding, Motivation, Ability to Follow Recommendations: Expect good compliance.   Monitoring and Evaluation: Goals: 1) 1-2# wt loss per week; 2) Hgb A1c < 7.0; 3) Continue with current exercise regimen  Recommendations: 1) For weight loss: 1500-1600 kcals daily; 2) Continue with exercise regimen; 3) One serving of starch per meal; 4) Be careful of serving sizes  F/U: PRN. Pt did not desire follow-up at this time.   Ersa Delaney A. Mayford Knife, RD, LDN 10/27/2012  Appt EndTime: 1610

## 2012-11-10 DIAGNOSIS — C9000 Multiple myeloma not having achieved remission: Secondary | ICD-10-CM

## 2012-12-08 DIAGNOSIS — R946 Abnormal results of thyroid function studies: Secondary | ICD-10-CM

## 2012-12-08 DIAGNOSIS — C9000 Multiple myeloma not having achieved remission: Secondary | ICD-10-CM

## 2012-12-13 ENCOUNTER — Encounter (HOSPITAL_COMMUNITY): Payer: Self-pay | Admitting: Emergency Medicine

## 2012-12-13 ENCOUNTER — Emergency Department (HOSPITAL_COMMUNITY)
Admission: EM | Admit: 2012-12-13 | Discharge: 2012-12-13 | Disposition: A | Discharge status: Home / Self Care | Payer: Medicaid Other | Attending: Emergency Medicine | Admitting: Emergency Medicine

## 2012-12-13 DIAGNOSIS — Z8701 Personal history of pneumonia (recurrent): Secondary | ICD-10-CM | POA: Insufficient documentation

## 2012-12-13 DIAGNOSIS — J40 Bronchitis, not specified as acute or chronic: Secondary | ICD-10-CM

## 2012-12-13 DIAGNOSIS — Z79899 Other long term (current) drug therapy: Secondary | ICD-10-CM | POA: Insufficient documentation

## 2012-12-13 DIAGNOSIS — E119 Type 2 diabetes mellitus without complications: Secondary | ICD-10-CM | POA: Insufficient documentation

## 2012-12-13 DIAGNOSIS — I10 Essential (primary) hypertension: Secondary | ICD-10-CM | POA: Insufficient documentation

## 2012-12-13 DIAGNOSIS — J029 Acute pharyngitis, unspecified: Secondary | ICD-10-CM | POA: Insufficient documentation

## 2012-12-13 DIAGNOSIS — Z8619 Personal history of other infectious and parasitic diseases: Secondary | ICD-10-CM | POA: Insufficient documentation

## 2012-12-13 DIAGNOSIS — Z859 Personal history of malignant neoplasm, unspecified: Secondary | ICD-10-CM | POA: Insufficient documentation

## 2012-12-13 DIAGNOSIS — J209 Acute bronchitis, unspecified: Secondary | ICD-10-CM | POA: Insufficient documentation

## 2012-12-13 DIAGNOSIS — Z862 Personal history of diseases of the blood and blood-forming organs and certain disorders involving the immune mechanism: Secondary | ICD-10-CM | POA: Insufficient documentation

## 2012-12-13 DIAGNOSIS — Z9484 Stem cells transplant status: Secondary | ICD-10-CM | POA: Insufficient documentation

## 2012-12-13 DIAGNOSIS — Z792 Long term (current) use of antibiotics: Secondary | ICD-10-CM | POA: Insufficient documentation

## 2012-12-13 HISTORY — DX: Stem cells transplant status: Z94.84

## 2012-12-13 LAB — GLUCOSE, CAPILLARY: Glucose-Capillary: 295 mg/dL — ABNORMAL HIGH (ref 70–99)

## 2012-12-13 MED ORDER — GUAIFENESIN-CODEINE 100-10 MG/5ML PO SYRP: 5.0000 mL | ORAL_SOLUTION | Freq: Three times a day (TID) | ORAL | Status: DC | PRN

## 2012-12-13 MED ORDER — AZITHROMYCIN 250 MG PO TABS: 250.0000 mg | ORAL_TABLET | Freq: Every day | ORAL | Status: DC

## 2012-12-13 NOTE — ED Provider Notes (Signed)
CSN: [id number]     Arrival date & time 12/13/12  1057 History   First MD Initiated Contact with Patient 12/13/12 1106     Chief Complaint  Patient presents with  . Cough   (Consider location/radiation/quality/duration/timing/severity/associated sxs/prior Treatment) Patient is a 52 y.o. male presenting with cough. The history is provided by the patient.  Cough Cough characteristics:  Non-productive Severity:  Moderate Onset quality:  Gradual Duration:  2 days Timing:  Sporadic Progression:  Worsening Chronicity:  New Smoker: no   Context: not sick contacts   Relieved by:  None tried Worsened by:  Lying down Ineffective treatments:  None tried Associated symptoms: no chest pain, no chills, no ear fullness, no ear pain, no eye discharge, no fever, no headaches, no rash, no shortness of breath, no sinus congestion, no sore throat and no wheezing    John Singleton is a 52 y.o. male who presents to the ED with cough and congestion for the past 2 days. His throat feels dry and he can't get any of the congestion to come up. He has not had wheezing, shortness of breath or fever. He has a history of sepsis, pneumonia, diabetes and multiple myeloma with stem cell transplant. Past Medical History  Diagnosis Date  . Pneumonia   . Sepsis(995.91)   . Cancer   . Multiple myeloma   . Diabetes mellitus without complication   . HTN (hypertension)   . Stem cells transplant status    Past Surgical History  Procedure Laterality Date  . Nose surgery      "when I was a child"   No family history on file. History  Substance Use Topics  . Smoking status: Never Smoker   . Smokeless tobacco: Not on file  . Alcohol Use: Yes     Comment: occasional    Review of Systems  Constitutional: Negative for fever, chills, activity change and appetite change.  HENT: Positive for congestion and sinus pressure. Negative for ear pain, sore throat and trouble swallowing.   Eyes: Negative for discharge,  itching and visual disturbance.  Respiratory: Positive for cough. Negative for shortness of breath and wheezing.   Cardiovascular: Negative for chest pain.  Gastrointestinal: Negative for nausea, vomiting and abdominal pain.  Musculoskeletal: Negative for back pain and gait problem.  Skin: Negative for rash.  Allergic/Immunologic:       Stem cell transplant 08/07/2012 for Multiple myeloma   Neurological: Negative for dizziness and headaches.  Psychiatric/Behavioral: The patient is not nervous/anxious.     Allergies  Review of patient's allergies indicates no known allergies.  Home Medications   Current Outpatient Rx  Name  Route  Sig  Dispense  Refill  . acyclovir (ZOVIRAX) 200 MG capsule   Oral   Take by mouth 2 (two) times daily.         . cholecalciferol (VITAMIN D) 1000 UNITS tablet   Oral   Take 1,000 Units by mouth daily.         . folic acid (FOLVITE) 1 MG tablet   Oral   Take 1 mg by mouth daily.         Marland Kitchen lenalidomide (REVLIMID) 10 MG capsule   Oral   Take 10 mg by mouth daily.         . metFORMIN (GLUCOPHAGE) 500 MG tablet   Oral   Take 500 mg by mouth 2 (two) times daily with a meal.         . Multiple Vitamin (MULTIVITAMIN  WITH MINERALS) TABS tablet   Oral   Take 1 tablet by mouth daily.         . ramipril (ALTACE) 5 MG capsule   Oral   Take 5 mg by mouth daily.         Marland Kitchen sulfamethoxazole-trimethoprim (BACTRIM DS) 800-160 MG per tablet   Oral   Take 1 tablet by mouth 3 (three) times a week.          Marland Kitchen azithromycin (ZITHROMAX) 250 MG tablet   Oral   Take 1 tablet (250 mg total) by mouth daily. Take first 2 tablets together, then 1 every day until finished.   6 tablet   0   . guaiFENesin-codeine (ROBITUSSIN AC) 100-10 MG/5ML syrup   Oral   Take 5 mLs by mouth 3 (three) times daily as needed for cough.   120 mL   0   . EXPIRED: metFORMIN (GLUCOPHAGE) 500 MG tablet   Oral   Take 1 tablet (500 mg total) by mouth 2 (two) times  daily with a meal.   60 tablet   0    BP 146/89  Pulse 110  Temp(Src) 97.5 F (36.4 C) (Oral)  Resp 18  Ht 5\' 8"  (1.727 m)  Wt 240 lb (108.863 kg)  BMI 36.50 kg/m2  SpO2 98% Physical Exam  Nursing note and vitals reviewed. Constitutional: He is oriented to person, place, and time. He appears well-developed and well-nourished. No distress.  HENT:  Head: Normocephalic and atraumatic.  Nose: Rhinorrhea present. Right sinus exhibits no maxillary sinus tenderness. Left sinus exhibits no maxillary sinus tenderness.  Mouth/Throat: Uvula is midline and mucous membranes are normal. Posterior oropharyngeal erythema present.  Eyes: EOM are normal.  Neck: Normal range of motion. Neck supple.  Cardiovascular: Normal rate and regular rhythm.   Pulmonary/Chest: Effort normal and breath sounds normal.  Abdominal: Soft. There is no tenderness.  Musculoskeletal: Normal range of motion.  Neurological: He is alert and oriented to person, place, and time. No cranial nerve deficit.  Skin: Skin is warm and dry.  Psychiatric: He has a normal mood and affect. His behavior is normal.    ED Course: I discussed this case with Dr. Estell Harpin  Procedures   MDM  52 y.o. male with bronchitis and pharyngitis. Will treat with cough medication and antibiotics. He is to follow up with his PCP. Stable for discharge without any immediate complications.  Discussed with the patient and all questioned fully answered. He will return here if symptoms worsen.    Medication List    TAKE these medications       azithromycin 250 MG tablet  Commonly known as:  ZITHROMAX  Take 1 tablet (250 mg total) by mouth daily. Take first 2 tablets together, then 1 every day until finished.     guaiFENesin-codeine 100-10 MG/5ML syrup  Commonly known as:  ROBITUSSIN AC  Take 5 mLs by mouth 3 (three) times daily as needed for cough.      ASK your doctor about these medications       acyclovir 200 MG capsule  Commonly known as:   ZOVIRAX  Take by mouth 2 (two) times daily.     cholecalciferol 1000 UNITS tablet  Commonly known as:  VITAMIN D  Take 1,000 Units by mouth daily.     folic acid 1 MG tablet  Commonly known as:  FOLVITE  Take 1 mg by mouth daily.     lenalidomide 10 MG capsule  Commonly known as:  REVLIMID  Take 10 mg by mouth daily.     metFORMIN 500 MG tablet  Commonly known as:  GLUCOPHAGE  Take 500 mg by mouth 2 (two) times daily with a meal.     metFORMIN 500 MG tablet  Commonly known as:  GLUCOPHAGE  Take 1 tablet (500 mg total) by mouth 2 (two) times daily with a meal.     multivitamin with minerals Tabs tablet  Take 1 tablet by mouth daily.     ramipril 5 MG capsule  Commonly known as:  ALTACE  Take 5 mg by mouth daily.     sulfamethoxazole-trimethoprim 800-160 MG per tablet  Commonly known as:  BACTRIM DS  Take 1 tablet by mouth 3 (three) times a week.            Janne Napoleon, [place] 12/13/12 1511

## 2012-12-13 NOTE — ED Notes (Signed)
Pt with nasal congestion and NP cough, denies fever or N/V/D

## 2012-12-14 NOTE — ED Provider Notes (Signed)
Medical screening examination/treatment/procedure(s) were performed by non-physician practitioner and as supervising physician I was immediately available for consultation/collaboration.  EKG Interpretation   None         [name], MD 12/14/12 [phone]

## 2014-02-22 ENCOUNTER — Encounter (HOSPITAL_COMMUNITY): Payer: Self-pay | Admitting: *Deleted

## 2014-02-22 ENCOUNTER — Emergency Department (HOSPITAL_COMMUNITY)
Admission: EM | Admit: 2014-02-22 | Discharge: 2014-02-22 | Disposition: A | Discharge status: Home / Self Care | Payer: 59 | Attending: Emergency Medicine | Admitting: Emergency Medicine

## 2014-02-22 DIAGNOSIS — S70361A Insect bite (nonvenomous), right thigh, initial encounter: Secondary | ICD-10-CM | POA: Diagnosis not present

## 2014-02-22 DIAGNOSIS — E119 Type 2 diabetes mellitus without complications: Secondary | ICD-10-CM | POA: Insufficient documentation

## 2014-02-22 DIAGNOSIS — Z8619 Personal history of other infectious and parasitic diseases: Secondary | ICD-10-CM | POA: Insufficient documentation

## 2014-02-22 DIAGNOSIS — Y9289 Other specified places as the place of occurrence of the external cause: Secondary | ICD-10-CM | POA: Insufficient documentation

## 2014-02-22 DIAGNOSIS — Y9389 Activity, other specified: Secondary | ICD-10-CM | POA: Insufficient documentation

## 2014-02-22 DIAGNOSIS — Z8701 Personal history of pneumonia (recurrent): Secondary | ICD-10-CM | POA: Insufficient documentation

## 2014-02-22 DIAGNOSIS — I10 Essential (primary) hypertension: Secondary | ICD-10-CM | POA: Insufficient documentation

## 2014-02-22 DIAGNOSIS — W57XXXA Bitten or stung by nonvenomous insect and other nonvenomous arthropods, initial encounter: Secondary | ICD-10-CM | POA: Diagnosis not present

## 2014-02-22 DIAGNOSIS — S70362A Insect bite (nonvenomous), left thigh, initial encounter: Secondary | ICD-10-CM | POA: Diagnosis not present

## 2014-02-22 DIAGNOSIS — Z9484 Stem cells transplant status: Secondary | ICD-10-CM | POA: Diagnosis not present

## 2014-02-22 DIAGNOSIS — Y998 Other external cause status: Secondary | ICD-10-CM | POA: Diagnosis not present

## 2014-02-22 DIAGNOSIS — Z8579 Personal history of other malignant neoplasms of lymphoid, hematopoietic and related tissues: Secondary | ICD-10-CM | POA: Insufficient documentation

## 2014-02-22 DIAGNOSIS — Z79899 Other long term (current) drug therapy: Secondary | ICD-10-CM | POA: Insufficient documentation

## 2014-02-22 MED ORDER — TRIAMCINOLONE ACETONIDE 0.1 % EX CREA: 1.0000 "application " | TOPICAL_CREAM | Freq: Three times a day (TID) | CUTANEOUS | Status: DC

## 2014-02-22 NOTE — ED Notes (Addendum)
Rash to both legs for 2 days  Present to lt ant thigh and rt post. Thigh

## 2014-02-22 NOTE — ED Provider Notes (Signed)
CSN: [id number]     Arrival date & time 02/22/14  1633 History  This chart was scribed for non-physician practitioner Kem Parkinson, PA-C working with Sharyon Cable, MD by Zola Button, ED Scribe. This patient was seen in room APFT24/APFT24 and the patient's care was started at 4:55 PM.    Chief Complaint  Patient presents with  . Rash   Patient is a 54 y.o. male presenting with rash. The history is provided by the patient. No language interpreter was used.  Rash Associated symptoms: no fever, no headaches, no shortness of breath, no sore throat and not wheezing    HPI Comments: John Singleton is a 54 y.o. male who presents to the Emergency Department complaining of gradual onset rash to his bilateral legs that started 2 days ago. Patient states that he first noticed a single red spot on his left anterior thigh, then noticed several more within same area of the leg and to the back of the right leg. He denies itching or pain to the areas, swelling, joint pains, fever or chills. He has tried "blue Star" ointment without relief.  Nothing makes the sx's better or worse.  Past Medical History  Diagnosis Date  . Pneumonia   . Sepsis(995.91)   . Diabetes mellitus without complication   . HTN (hypertension)   . Stem cells transplant status   . Cancer   . Multiple myeloma    Past Surgical History  Procedure Laterality Date  . Nose surgery      "when I was a child"   History reviewed. No pertinent family history. History  Substance Use Topics  . Smoking status: Never Smoker   . Smokeless tobacco: Not on file  . Alcohol Use: No     Comment: occasional    Review of Systems  Constitutional: Negative for fever, chills, activity change and appetite change.  HENT: Negative for facial swelling, sore throat and trouble swallowing.   Respiratory: Negative for chest tightness, shortness of breath and wheezing.   Musculoskeletal: Negative for neck pain and neck stiffness.  Skin: Positive for  rash. Negative for wound.  Neurological: Negative for dizziness, weakness, numbness and headaches.  All other systems reviewed and are negative.     Allergies  Review of patient's allergies indicates no known allergies.  Home Medications   Prior to Admission medications   Medication Sig Start Date End Date Taking? Authorizing Provider  acyclovir (ZOVIRAX) 200 MG capsule Take by mouth 2 (two) times daily.    Historical Provider, MD  azithromycin (ZITHROMAX) 250 MG tablet Take 1 tablet (250 mg total) by mouth daily. Take first 2 tablets together, then 1 every day until finished. 12/13/12   Hope Bunnie Pion, NP  cholecalciferol (VITAMIN D) 1000 UNITS tablet Take 1,000 Units by mouth daily.    Historical Provider, MD  folic acid (FOLVITE) 1 MG tablet Take 1 mg by mouth daily.    Historical Provider, MD  guaiFENesin-codeine (ROBITUSSIN AC) 100-10 MG/5ML syrup Take 5 mLs by mouth 3 (three) times daily as needed for cough. 12/13/12   Hope Bunnie Pion, NP  lenalidomide (REVLIMID) 10 MG capsule Take 10 mg by mouth daily.    Historical Provider, MD  metFORMIN (GLUCOPHAGE) 500 MG tablet Take 1 tablet (500 mg total) by mouth 2 (two) times daily with a meal. 05/17/11 05/16/12  Nat Christen, MD  metFORMIN (GLUCOPHAGE) 500 MG tablet Take 500 mg by mouth 2 (two) times daily with a meal.    Historical Provider,  MD  Multiple Vitamin (MULTIVITAMIN WITH MINERALS) TABS tablet Take 1 tablet by mouth daily.    Historical Provider, MD  ramipril (ALTACE) 5 MG capsule Take 5 mg by mouth daily.    Historical Provider, MD  sulfamethoxazole-trimethoprim (BACTRIM DS) 800-160 MG per tablet Take 1 tablet by mouth 3 (three) times a week.     Historical Provider, MD   BP 151/97 mmHg  Pulse 84  Temp(Src) 98.4 F (36.9 C) (Oral)  Resp 18  Ht _0  (1.753 m)  Wt 235 lb (106.595 kg)  BMI 34.69 kg/m2  SpO2 100% Physical Exam  Constitutional: He is oriented to person, place, and time. He appears well-developed and well-nourished.  No distress.  HENT:  Head: Normocephalic and atraumatic.  Mouth/Throat: Oropharynx is clear and moist. No oropharyngeal exudate.  Neck: Normal range of motion. Neck supple.  Cardiovascular: Normal rate, regular rhythm, normal heart sounds and intact distal pulses.   No murmur heard. Pulmonary/Chest: Effort normal and breath sounds normal. No respiratory distress. He has no wheezes. He has no rales.  Musculoskeletal: Normal range of motion. He exhibits no edema or tenderness.  Lymphadenopathy:    He has no cervical adenopathy.  Neurological: He is alert and oriented to person, place, and time. No cranial nerve deficit.  Skin: Skin is warm and dry. Rash noted.  5 non-blanching pea sized lesions to the left thigh and 2 similar appearing lesions to the right posterior thigh. No induration or pustules.   Psychiatric: He has a normal mood and affect. His behavior is normal.  Nursing note and vitals reviewed.   ED Course  Procedures  DIAGNOSTIC STUDIES: Oxygen Saturation is 100% on room air, normal by my interpretation.    COORDINATION OF CARE: 4:59 PM-Discussed treatment plan which includes medications with pt at bedside and pt agreed to plan.    Labs Review Labs Reviewed - No data to display  Imaging Review No results found.   EKG Interpretation None      MDM   Final diagnoses:  Insect bites    Pt is well appearing.  Rash to bilateral lower extremities appear c/w insect bites.  Pt agrees to symptomatic treatment with benadryl and steroid cream.  I have advised him to f/u with PMD or to return here if sx's are not improving or worsen.  He agrees to plan.    I personally performed the services described in this documentation, which was scribed in my presence. The recorded information has been reviewed and is accurate.    Lavena Loretto L. Vanessa [place], PA-C 02/23/14 [place], MD 02/25/14 Natasha Mead

## 2014-02-22 NOTE — Discharge Instructions (Signed)
Insect Bite Mosquitoes, flies, fleas, bedbugs, and other insects can bite. Insect bites are different from insect stings. The bite may be red, puffy (swollen), and itchy for 2 to 4 days. Most bites get better on their own. HOME CARE   Do not scratch the bite.  Keep the bite clean and dry. Wash the bite with soap and water.  Put ice on the bite.  Put ice in a plastic bag.  Place a towel between your skin and the bag.  Leave the ice on for 20 minutes, 4 times a day. Do this for the first 2 to 3 days, or as told by your doctor.  You may use medicated lotions or creams to lessen itching as told by your doctor.  Only take medicines as told by your doctor.  If you are given medicines (antibiotics), take them as told. Finish them even if you start to feel better. You may need a tetanus shot if:  You cannot remember when you had your last tetanus shot.  You have never had a tetanus shot.  The injury broke your skin. If you need a tetanus shot and you choose not to have one, you may get tetanus. Sickness from tetanus can be serious. GET HELP RIGHT AWAY IF:   You have more pain, redness, or puffiness.  You see a red line on the skin coming from the bite.  You have a fever.  You have joint pain.  You have a headache or neck pain.  You feel weak.  You have a rash.  You have chest pain, or you are short of breath.  You have belly (abdominal) pain.  You feel sick to your stomach (nauseous) or throw up (vomit).  You feel very tired or sleepy. MAKE SURE YOU:   Understand these instructions.  Will watch your condition.  Will get help right away if you are not doing well or get worse. Document Released: 01/06/2000 Document Revised: 04/02/2011 Document Reviewed: 08/09/2010 [facility] Patient Information 2015 [place], [place]. This information is not intended to replace advice given to you by your health care provider. Make sure you discuss any questions you have with your health  care provider.

## 2014-03-30 DIAGNOSIS — J069 Acute upper respiratory infection, unspecified: Secondary | ICD-10-CM | POA: Diagnosis not present

## 2014-03-30 DIAGNOSIS — Z79899 Other long term (current) drug therapy: Secondary | ICD-10-CM | POA: Diagnosis not present

## 2014-03-30 DIAGNOSIS — Z7982 Long term (current) use of aspirin: Secondary | ICD-10-CM | POA: Diagnosis not present

## 2014-03-30 DIAGNOSIS — E119 Type 2 diabetes mellitus without complications: Secondary | ICD-10-CM | POA: Diagnosis not present

## 2014-03-30 DIAGNOSIS — Z833 Family history of diabetes mellitus: Secondary | ICD-10-CM | POA: Diagnosis not present

## 2014-03-30 DIAGNOSIS — R05 Cough: Secondary | ICD-10-CM | POA: Diagnosis not present

## 2014-03-30 DIAGNOSIS — C9000 Multiple myeloma not having achieved remission: Secondary | ICD-10-CM | POA: Diagnosis not present

## 2014-03-30 DIAGNOSIS — I10 Essential (primary) hypertension: Secondary | ICD-10-CM | POA: Diagnosis not present

## 2014-03-30 DIAGNOSIS — Z8249 Family history of ischemic heart disease and other diseases of the circulatory system: Secondary | ICD-10-CM | POA: Diagnosis not present

## 2014-04-07 DIAGNOSIS — E089 Diabetes mellitus due to underlying condition without complications: Secondary | ICD-10-CM | POA: Diagnosis not present

## 2014-04-07 DIAGNOSIS — E669 Obesity, unspecified: Secondary | ICD-10-CM | POA: Diagnosis not present

## 2014-04-07 DIAGNOSIS — C9000 Multiple myeloma not having achieved remission: Secondary | ICD-10-CM | POA: Diagnosis not present

## 2014-04-27 DIAGNOSIS — C9000 Multiple myeloma not having achieved remission: Secondary | ICD-10-CM | POA: Diagnosis not present

## 2014-04-27 DIAGNOSIS — C7951 Secondary malignant neoplasm of bone: Secondary | ICD-10-CM | POA: Diagnosis not present

## 2014-05-04 DIAGNOSIS — I10 Essential (primary) hypertension: Secondary | ICD-10-CM | POA: Diagnosis not present

## 2014-05-04 DIAGNOSIS — C9000 Multiple myeloma not having achieved remission: Secondary | ICD-10-CM | POA: Diagnosis not present

## 2014-05-04 DIAGNOSIS — E669 Obesity, unspecified: Secondary | ICD-10-CM | POA: Diagnosis not present

## 2014-05-04 DIAGNOSIS — E1165 Type 2 diabetes mellitus with hyperglycemia: Secondary | ICD-10-CM | POA: Diagnosis not present

## 2014-06-02 DIAGNOSIS — C7951 Secondary malignant neoplasm of bone: Secondary | ICD-10-CM | POA: Diagnosis not present

## 2014-06-02 DIAGNOSIS — C9000 Multiple myeloma not having achieved remission: Secondary | ICD-10-CM | POA: Diagnosis not present

## 2014-06-07 DIAGNOSIS — E669 Obesity, unspecified: Secondary | ICD-10-CM | POA: Diagnosis not present

## 2014-06-07 DIAGNOSIS — C7951 Secondary malignant neoplasm of bone: Secondary | ICD-10-CM | POA: Diagnosis not present

## 2014-06-07 DIAGNOSIS — C9000 Multiple myeloma not having achieved remission: Secondary | ICD-10-CM | POA: Diagnosis not present

## 2014-06-08 IMAGING — CR DG CHEST 2V
2 series · 2 of 2 positions shown · non-contrast
Comparison: 09/30/2011 CT performed at [facility] [HOSPITAL].

CLINICAL DATA: Shortness of breath

CHEST - 2 VIEW

[view not recorded (1 of 2)]
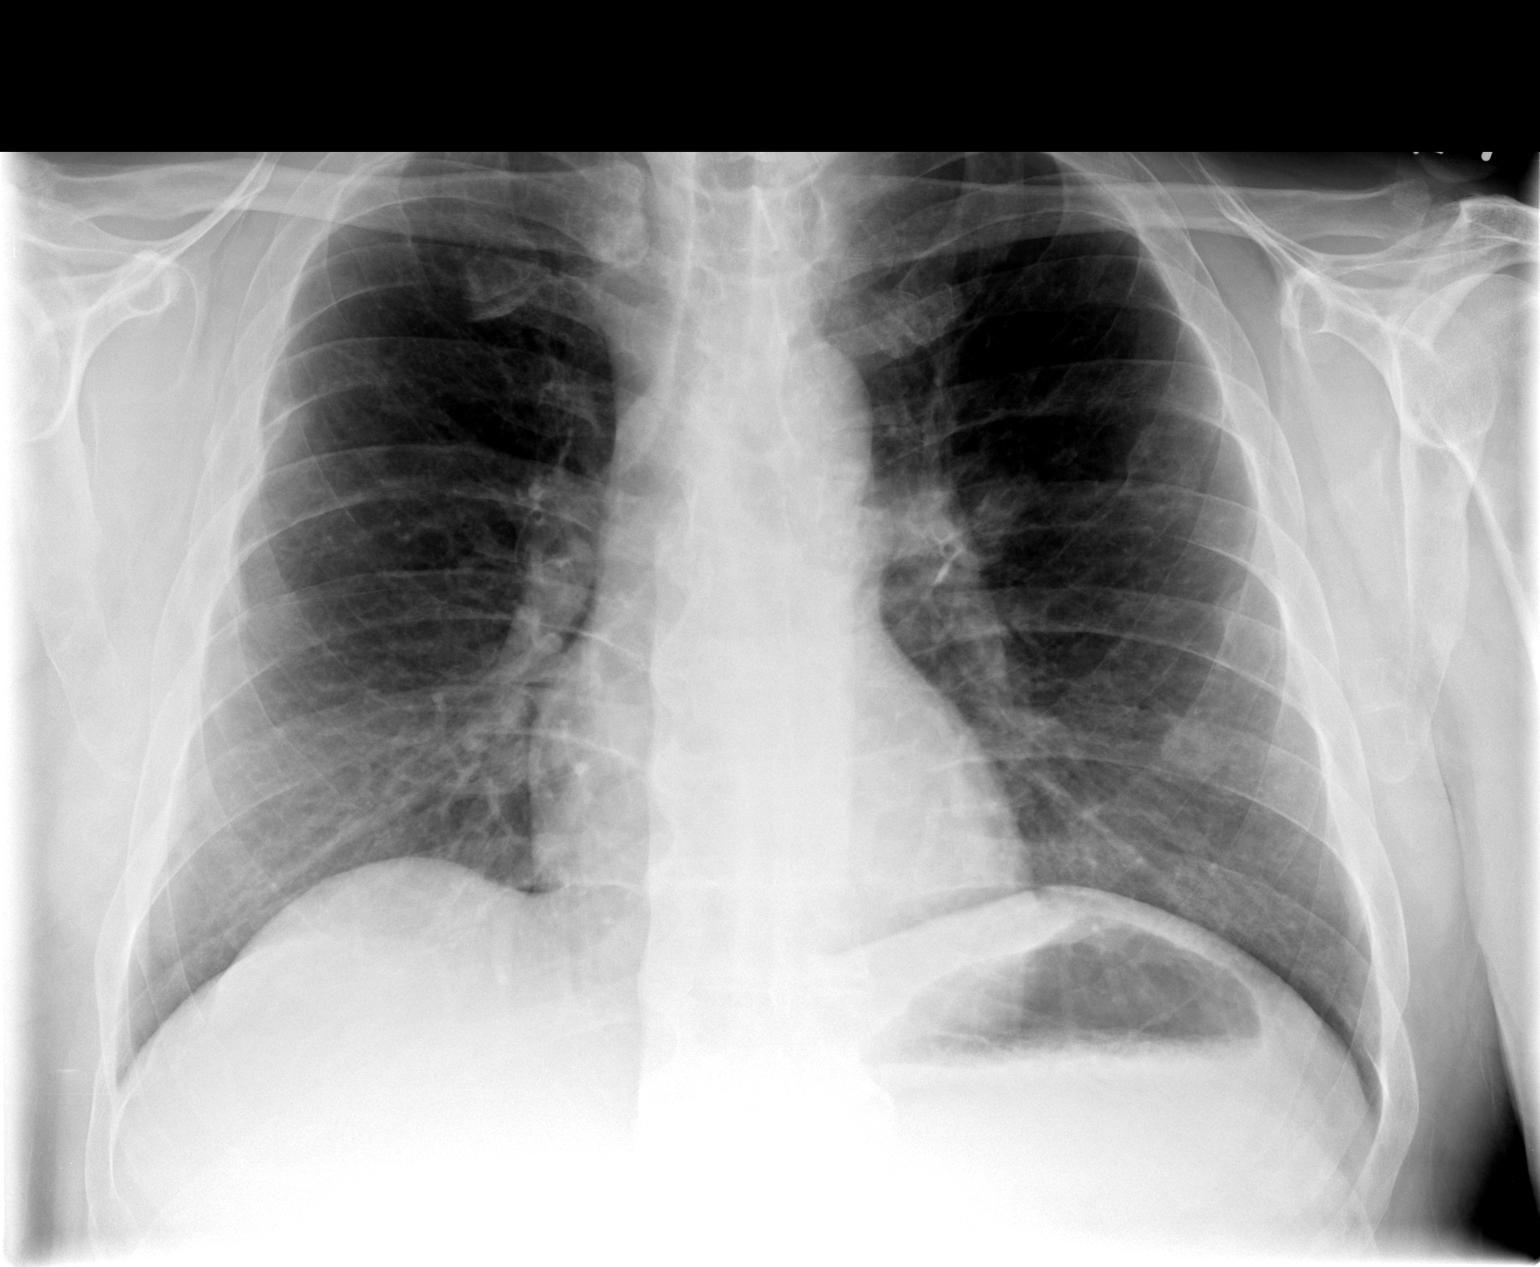

[view not recorded (2 of 2)]
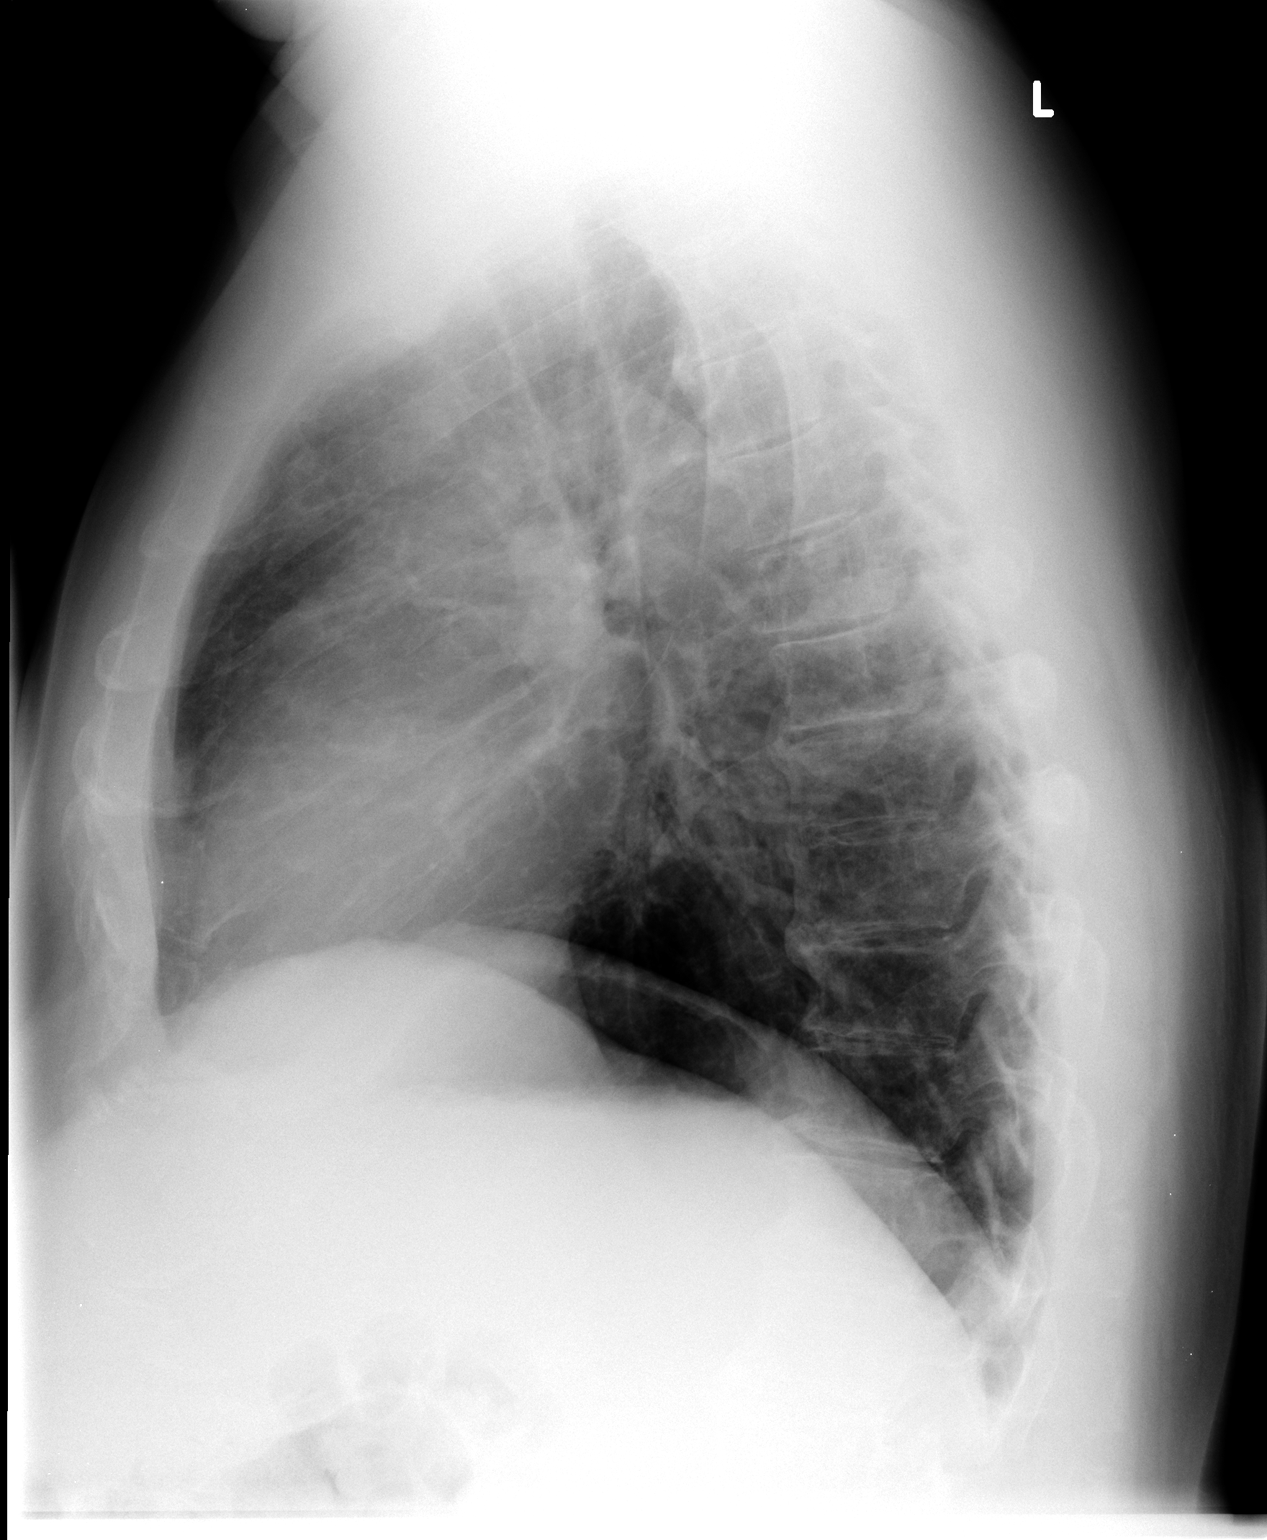

[2 of 2 positions shown; findings below may reference images not displayed]

FINDINGS: Cardiomediastinal contours within normal range.  Lungs
are clear.  No pleural effusion or pneumothorax.  Lytic osseous
lesions better characterized on prior CT. There is sequelae of
pathologic rib fractures. Some show callus formation.  Some
fracture lines are still evident.
IMPRESSION: No radiographic evidence of acute cardiopulmonary process.

Known osseous lesions and pathologic rib fractures as characterized
on recent prior CT.

## 2014-06-22 DIAGNOSIS — C9000 Multiple myeloma not having achieved remission: Secondary | ICD-10-CM | POA: Diagnosis not present

## 2014-06-30 DIAGNOSIS — C9000 Multiple myeloma not having achieved remission: Secondary | ICD-10-CM | POA: Diagnosis not present

## 2014-07-09 DIAGNOSIS — Z7982 Long term (current) use of aspirin: Secondary | ICD-10-CM | POA: Diagnosis not present

## 2014-07-09 DIAGNOSIS — C9000 Multiple myeloma not having achieved remission: Secondary | ICD-10-CM | POA: Diagnosis not present

## 2014-07-09 DIAGNOSIS — E119 Type 2 diabetes mellitus without complications: Secondary | ICD-10-CM | POA: Diagnosis not present

## 2014-07-09 DIAGNOSIS — I10 Essential (primary) hypertension: Secondary | ICD-10-CM | POA: Diagnosis not present

## 2014-07-09 DIAGNOSIS — Z9484 Stem cells transplant status: Secondary | ICD-10-CM | POA: Diagnosis not present

## 2014-08-04 DIAGNOSIS — Z9484 Stem cells transplant status: Secondary | ICD-10-CM | POA: Diagnosis not present

## 2014-08-04 DIAGNOSIS — I10 Essential (primary) hypertension: Secondary | ICD-10-CM | POA: Diagnosis not present

## 2014-08-04 DIAGNOSIS — E1165 Type 2 diabetes mellitus with hyperglycemia: Secondary | ICD-10-CM | POA: Diagnosis not present

## 2014-08-04 DIAGNOSIS — C9000 Multiple myeloma not having achieved remission: Secondary | ICD-10-CM | POA: Diagnosis not present

## 2014-08-04 DIAGNOSIS — E663 Overweight: Secondary | ICD-10-CM | POA: Diagnosis not present

## 2014-08-04 DIAGNOSIS — C7951 Secondary malignant neoplasm of bone: Secondary | ICD-10-CM | POA: Diagnosis not present

## 2014-08-17 DIAGNOSIS — C9000 Multiple myeloma not having achieved remission: Secondary | ICD-10-CM | POA: Diagnosis not present

## 2014-08-17 DIAGNOSIS — C7951 Secondary malignant neoplasm of bone: Secondary | ICD-10-CM | POA: Diagnosis not present

## 2014-08-17 DIAGNOSIS — Z23 Encounter for immunization: Secondary | ICD-10-CM | POA: Diagnosis not present

## 2014-08-30 DIAGNOSIS — C9000 Multiple myeloma not having achieved remission: Secondary | ICD-10-CM | POA: Diagnosis not present

## 2014-08-30 DIAGNOSIS — Z23 Encounter for immunization: Secondary | ICD-10-CM | POA: Diagnosis not present

## 2014-09-07 DIAGNOSIS — C9000 Multiple myeloma not having achieved remission: Secondary | ICD-10-CM | POA: Diagnosis not present

## 2014-09-07 DIAGNOSIS — E669 Obesity, unspecified: Secondary | ICD-10-CM | POA: Diagnosis not present

## 2014-09-07 DIAGNOSIS — C7951 Secondary malignant neoplasm of bone: Secondary | ICD-10-CM | POA: Diagnosis not present

## 2014-10-05 DIAGNOSIS — C9000 Multiple myeloma not having achieved remission: Secondary | ICD-10-CM | POA: Diagnosis not present

## 2014-11-02 DIAGNOSIS — C9000 Multiple myeloma not having achieved remission: Secondary | ICD-10-CM | POA: Diagnosis not present

## 2014-11-03 DIAGNOSIS — Z23 Encounter for immunization: Secondary | ICD-10-CM | POA: Diagnosis not present

## 2014-11-03 DIAGNOSIS — E1165 Type 2 diabetes mellitus with hyperglycemia: Secondary | ICD-10-CM | POA: Diagnosis not present

## 2014-11-03 DIAGNOSIS — E119 Type 2 diabetes mellitus without complications: Secondary | ICD-10-CM | POA: Diagnosis not present

## 2014-11-03 DIAGNOSIS — Z9484 Stem cells transplant status: Secondary | ICD-10-CM | POA: Diagnosis not present

## 2014-11-03 DIAGNOSIS — C9000 Multiple myeloma not having achieved remission: Secondary | ICD-10-CM | POA: Diagnosis not present

## 2014-11-03 DIAGNOSIS — E785 Hyperlipidemia, unspecified: Secondary | ICD-10-CM | POA: Diagnosis not present

## 2014-11-03 DIAGNOSIS — I10 Essential (primary) hypertension: Secondary | ICD-10-CM | POA: Diagnosis not present

## 2014-11-30 DIAGNOSIS — C9000 Multiple myeloma not having achieved remission: Secondary | ICD-10-CM | POA: Diagnosis not present

## 2014-11-30 DIAGNOSIS — E669 Obesity, unspecified: Secondary | ICD-10-CM | POA: Diagnosis not present

## 2014-11-30 DIAGNOSIS — C9001 Multiple myeloma in remission: Secondary | ICD-10-CM | POA: Diagnosis not present

## 2014-11-30 DIAGNOSIS — C7951 Secondary malignant neoplasm of bone: Secondary | ICD-10-CM | POA: Diagnosis not present

## 2014-11-30 DIAGNOSIS — M6258 Muscle wasting and atrophy, not elsewhere classified, other site: Secondary | ICD-10-CM | POA: Diagnosis not present

## 2014-11-30 DIAGNOSIS — D229 Melanocytic nevi, unspecified: Secondary | ICD-10-CM | POA: Diagnosis not present

## 2014-12-20 ENCOUNTER — Encounter (HOSPITAL_COMMUNITY): Payer: Self-pay | Admitting: Emergency Medicine

## 2014-12-20 ENCOUNTER — Emergency Department (HOSPITAL_COMMUNITY): Payer: Medicare Other

## 2014-12-20 ENCOUNTER — Emergency Department (HOSPITAL_COMMUNITY)
Admission: EM | Admit: 2014-12-20 | Discharge: 2014-12-20 | Disposition: A | Discharge status: Home / Self Care | Payer: Medicare Other | Attending: Emergency Medicine | Admitting: Emergency Medicine

## 2014-12-20 DIAGNOSIS — J209 Acute bronchitis, unspecified: Secondary | ICD-10-CM | POA: Insufficient documentation

## 2014-12-20 DIAGNOSIS — Z9484 Stem cells transplant status: Secondary | ICD-10-CM | POA: Insufficient documentation

## 2014-12-20 DIAGNOSIS — Z8619 Personal history of other infectious and parasitic diseases: Secondary | ICD-10-CM | POA: Diagnosis not present

## 2014-12-20 DIAGNOSIS — Z8701 Personal history of pneumonia (recurrent): Secondary | ICD-10-CM | POA: Insufficient documentation

## 2014-12-20 DIAGNOSIS — E119 Type 2 diabetes mellitus without complications: Secondary | ICD-10-CM | POA: Insufficient documentation

## 2014-12-20 DIAGNOSIS — R05 Cough: Secondary | ICD-10-CM | POA: Diagnosis not present

## 2014-12-20 DIAGNOSIS — Z8579 Personal history of other malignant neoplasms of lymphoid, hematopoietic and related tissues: Secondary | ICD-10-CM | POA: Diagnosis not present

## 2014-12-20 DIAGNOSIS — Z7952 Long term (current) use of systemic steroids: Secondary | ICD-10-CM | POA: Insufficient documentation

## 2014-12-20 DIAGNOSIS — I10 Essential (primary) hypertension: Secondary | ICD-10-CM | POA: Diagnosis not present

## 2014-12-20 DIAGNOSIS — J40 Bronchitis, not specified as acute or chronic: Secondary | ICD-10-CM | POA: Diagnosis not present

## 2014-12-20 DIAGNOSIS — Z79899 Other long term (current) drug therapy: Secondary | ICD-10-CM | POA: Diagnosis not present

## 2014-12-20 MED ORDER — ALBUTEROL SULFATE HFA 108 (90 BASE) MCG/ACT IN AERS: 1.0000 | INHALATION_SPRAY | Freq: Four times a day (QID) | RESPIRATORY_TRACT | Status: DC | PRN

## 2014-12-20 MED ORDER — AZITHROMYCIN 250 MG PO TABS: 250.0000 mg | ORAL_TABLET | Freq: Every day | ORAL | Status: DC

## 2014-12-20 NOTE — Discharge Instructions (Signed)

## 2014-12-20 NOTE — ED Provider Notes (Signed)
CSN: [id number]     Arrival date & time 12/20/14  1255 History  By signing my name below, I, John Singleton, attest that this documentation has been prepared under the direction and in the presence of John Hashimoto, PA-C.  Electronically Signed: Tula Singleton, ED Scribe. 12/20/2014. 2:35 PM.   Chief Complaint  Patient presents with  . Cough   The history is provided by the patient. No language interpreter was used.    HPI Comments: John Singleton is a 54 y.o. male who presents to the Emergency Department complaining of constant, moderate generalized body aches that started yesterday. Pt states chills that started yesterday as an associated symptom. He also reports cough, congestion and wheezing that started today. Pt denies sick contact. He also denies fever as an associated symptom.   PCP John Singleton  Past Medical History  Diagnosis Date  . Pneumonia   . Sepsis(995.91)   . Diabetes mellitus without complication (John Singleton)   . HTN (hypertension)   . Stem cells transplant status (John Singleton)   . Cancer (John Singleton)   . Multiple myeloma John Singleton)    Past Surgical History  Procedure Laterality Date  . Nose surgery      "when I was a child"   History reviewed. No pertinent family history. Social History  Substance Use Topics  . Smoking status: Never Smoker   . Smokeless tobacco: None  . Alcohol Use: No     Comment: occasional    Review of Systems  Constitutional: Positive for chills. Negative for fever.  HENT: Positive for congestion.   Respiratory: Positive for cough and wheezing.   Musculoskeletal: Positive for myalgias.  All other systems reviewed and are negative.     Allergies  Review of patient's allergies indicates no known allergies.  Home Medications   Prior to Admission medications   Medication Sig Start Date End Date Taking? Authorizing Provider  azithromycin (ZITHROMAX) 250 MG tablet Take 1 tablet (250 mg total) by mouth daily. Take first 2 tablets together, then 1 every  day until finished. Patient not taking: Reported on 02/22/2014 12/13/12   John Murrain, NP  cholecalciferol (VITAMIN D) 1000 UNITS tablet Take 1,000 Units by mouth daily.    Historical Provider, MD  guaiFENesin-codeine (ROBITUSSIN AC) 100-10 MG/5ML syrup Take 5 mLs by mouth 3 (three) times daily as needed for cough. Patient not taking: Reported on 02/22/2014 12/13/12   John Murrain, NP  lenalidomide (REVLIMID) 15 MG capsule Take 15 mg by mouth daily. Take for 21 days, then off for 7 days 01/27/14   Historical Provider, MD  metFORMIN (GLUCOPHAGE) 500 MG tablet Take 1 tablet (500 mg total) by mouth 2 (two) times daily with a meal. 05/17/11 02/22/14  John Christen, MD  Multiple Vitamin (MULTIVITAMIN WITH MINERALS) TABS tablet Take 1 tablet by mouth daily.    Historical Provider, MD  ramipril (ALTACE) 5 MG capsule Take 5 mg by mouth daily.    Historical Provider, MD  triamcinolone cream (KENALOG) 0.1 % Apply 1 application topically 3 (three) times daily. 02/22/14   Tammy Triplett, PA-C   BP 156/84 mmHg  Pulse 103  Temp(Src) 98.1 F (36.7 C) (Oral)  Resp 20  Ht _0  (1.753 m)  Wt 224 lb (101.606 kg)  BMI 33.06 kg/m2  SpO2 98% Physical Exam  Constitutional: He appears well-developed and well-nourished. No distress.  HENT:  Head: Normocephalic and atraumatic.  Eyes: Conjunctivae and EOM are normal.  Neck: Neck supple. No tracheal deviation present.  Cardiovascular:  Normal rate, regular rhythm and normal heart sounds.   Pulmonary/Chest: Effort normal. No respiratory distress. He has wheezes.  Rhonchi and faint wheezing in all lobes  Skin: Skin is warm and dry.  Psychiatric: He has a normal mood and affect. His behavior is normal.  Nursing note and vitals reviewed.   ED Course  Procedures  DIAGNOSTIC STUDIES: Oxygen Saturation is 98% on RA, normal by my interpretation.    COORDINATION OF CARE: 2:36 PM Discussed suspicion for bronchitis and treatment plan with pt which includes antibiotics and an  inhaler. Pt to follow-up with Dr. Legrand Singleton in a few days. He agreed to plan.  Labs Review Labs Reviewed - No data to display  Imaging Review Dg Chest 2 View  12/20/2014  CLINICAL DATA:  54 year old male acute cough and chills 1 day. History of multiple myeloma. EXAM: CHEST  2 VIEW COMPARISON:  04/08/2012 and prior chest radiographs FINDINGS: The cardiomediastinal silhouette is unremarkable. There is no evidence of focal airspace disease, pulmonary edema, suspicious pulmonary nodule/mass, pleural effusion, or pneumothorax. No acute bony abnormalities are identified. Remote bilateral rib fractures again noted. IMPRESSION: No active cardiopulmonary disease. Electronically Signed   By: Margarette Canada M.D.   On: 12/20/2014 13:45   I have personally reviewed and evaluated these images as part of my medical decision-making.   EKG Interpretation None      MDM   Final diagnoses:  Bronchitis   Albuterol zithromax Return if any problems.    John Kinnier [place], PA-C 12/20/14 [place], MD 12/21/14 [phone]

## 2014-12-20 NOTE — ED Notes (Signed)
Pt states that he has a cough and runny nose with congestion since last night.

## 2014-12-28 DIAGNOSIS — C9000 Multiple myeloma not having achieved remission: Secondary | ICD-10-CM | POA: Diagnosis not present

## 2015-01-25 DIAGNOSIS — C9000 Multiple myeloma not having achieved remission: Secondary | ICD-10-CM | POA: Diagnosis not present

## 2015-02-09 DIAGNOSIS — I10 Essential (primary) hypertension: Secondary | ICD-10-CM | POA: Diagnosis not present

## 2015-02-09 DIAGNOSIS — C9000 Multiple myeloma not having achieved remission: Secondary | ICD-10-CM | POA: Diagnosis not present

## 2015-02-09 DIAGNOSIS — E1165 Type 2 diabetes mellitus with hyperglycemia: Secondary | ICD-10-CM | POA: Diagnosis not present

## 2015-02-23 DIAGNOSIS — C9000 Multiple myeloma not having achieved remission: Secondary | ICD-10-CM | POA: Diagnosis not present

## 2015-02-23 DIAGNOSIS — C9001 Multiple myeloma in remission: Secondary | ICD-10-CM | POA: Diagnosis not present

## 2015-03-23 DIAGNOSIS — C9000 Multiple myeloma not having achieved remission: Secondary | ICD-10-CM | POA: Diagnosis not present

## 2015-04-25 DIAGNOSIS — L570 Actinic keratosis: Secondary | ICD-10-CM | POA: Diagnosis not present

## 2015-04-25 DIAGNOSIS — D485 Neoplasm of uncertain behavior of skin: Secondary | ICD-10-CM | POA: Diagnosis not present

## 2015-04-25 DIAGNOSIS — L814 Other melanin hyperpigmentation: Secondary | ICD-10-CM | POA: Diagnosis not present

## 2015-04-25 DIAGNOSIS — D239 Other benign neoplasm of skin, unspecified: Secondary | ICD-10-CM | POA: Diagnosis not present

## 2015-04-27 DIAGNOSIS — C9000 Multiple myeloma not having achieved remission: Secondary | ICD-10-CM | POA: Diagnosis not present

## 2015-05-04 DIAGNOSIS — E119 Type 2 diabetes mellitus without complications: Secondary | ICD-10-CM | POA: Diagnosis not present

## 2015-05-04 DIAGNOSIS — C9001 Multiple myeloma in remission: Secondary | ICD-10-CM | POA: Diagnosis not present

## 2015-05-04 DIAGNOSIS — Z6830 Body mass index (BMI) 30.0-30.9, adult: Secondary | ICD-10-CM | POA: Diagnosis not present

## 2015-05-04 DIAGNOSIS — I10 Essential (primary) hypertension: Secondary | ICD-10-CM | POA: Diagnosis not present

## 2015-05-23 DIAGNOSIS — E669 Obesity, unspecified: Secondary | ICD-10-CM | POA: Diagnosis not present

## 2015-05-23 DIAGNOSIS — C9000 Multiple myeloma not having achieved remission: Secondary | ICD-10-CM | POA: Diagnosis not present

## 2015-05-23 DIAGNOSIS — D229 Melanocytic nevi, unspecified: Secondary | ICD-10-CM | POA: Diagnosis not present

## 2015-05-23 DIAGNOSIS — C9001 Multiple myeloma in remission: Secondary | ICD-10-CM | POA: Diagnosis not present

## 2015-06-18 DIAGNOSIS — Z7982 Long term (current) use of aspirin: Secondary | ICD-10-CM | POA: Diagnosis not present

## 2015-06-18 DIAGNOSIS — Z7984 Long term (current) use of oral hypoglycemic drugs: Secondary | ICD-10-CM | POA: Diagnosis not present

## 2015-06-18 DIAGNOSIS — C9000 Multiple myeloma not having achieved remission: Secondary | ICD-10-CM | POA: Diagnosis not present

## 2015-06-18 DIAGNOSIS — Z79899 Other long term (current) drug therapy: Secondary | ICD-10-CM | POA: Diagnosis not present

## 2015-06-18 DIAGNOSIS — M542 Cervicalgia: Secondary | ICD-10-CM | POA: Diagnosis not present

## 2015-06-18 DIAGNOSIS — I10 Essential (primary) hypertension: Secondary | ICD-10-CM | POA: Diagnosis not present

## 2015-06-18 DIAGNOSIS — E119 Type 2 diabetes mellitus without complications: Secondary | ICD-10-CM | POA: Diagnosis not present

## 2015-06-20 ENCOUNTER — Encounter (HOSPITAL_COMMUNITY): Payer: Self-pay | Admitting: *Deleted

## 2015-06-20 ENCOUNTER — Emergency Department (HOSPITAL_COMMUNITY)
Admission: EM | Admit: 2015-06-20 | Discharge: 2015-06-20 | Disposition: A | Discharge status: Home / Self Care | Payer: Medicare Other | Attending: Emergency Medicine | Admitting: Emergency Medicine

## 2015-06-20 DIAGNOSIS — M62831 Muscle spasm of calf: Secondary | ICD-10-CM | POA: Diagnosis not present

## 2015-06-20 DIAGNOSIS — I10 Essential (primary) hypertension: Secondary | ICD-10-CM | POA: Insufficient documentation

## 2015-06-20 DIAGNOSIS — M62838 Other muscle spasm: Secondary | ICD-10-CM | POA: Diagnosis not present

## 2015-06-20 DIAGNOSIS — Z79899 Other long term (current) drug therapy: Secondary | ICD-10-CM | POA: Diagnosis not present

## 2015-06-20 DIAGNOSIS — M791 Myalgia: Secondary | ICD-10-CM | POA: Diagnosis present

## 2015-06-20 DIAGNOSIS — M542 Cervicalgia: Secondary | ICD-10-CM | POA: Diagnosis not present

## 2015-06-20 DIAGNOSIS — E119 Type 2 diabetes mellitus without complications: Secondary | ICD-10-CM | POA: Insufficient documentation

## 2015-06-20 DIAGNOSIS — Z7984 Long term (current) use of oral hypoglycemic drugs: Secondary | ICD-10-CM | POA: Insufficient documentation

## 2015-06-20 MED ORDER — NAPROXEN 500 MG PO TABS: 500.0000 mg | ORAL_TABLET | Freq: Two times a day (BID) | ORAL | Status: DC

## 2015-06-20 MED ORDER — DIAZEPAM 5 MG PO TABS: 5.0000 mg | ORAL_TABLET | Freq: Three times a day (TID) | ORAL | Status: DC | PRN

## 2015-06-20 MED ORDER — NAPROXEN 250 MG PO TABS
500.0000 mg | ORAL_TABLET | Freq: Once | ORAL | Status: AC
  Administered 2015-06-20: 500 mg via ORAL
  Filled 2015-06-20: qty 2

## 2015-06-20 MED ORDER — DIAZEPAM 5 MG PO TABS
5.0000 mg | ORAL_TABLET | Freq: Once | ORAL | Status: AC
  Administered 2015-06-20: 5 mg via ORAL
  Filled 2015-06-20: qty 1

## 2015-06-20 NOTE — ED Provider Notes (Signed)
CSN: [id number]     Arrival date & time 06/20/15  0017 History   First MD Initiated Contact with Patient 06/20/15 0207     Chief Complaint  Patient presents with  . Muscle Pain     (Consider location/radiation/quality/duration/timing/severity/associated sxs/prior Treatment) HPI Comments: Pt comes in with cc of muscle pain. Pt reports that his R shoulder and neck area has been hurting him the last few days. Pt's pain was worse tonight, he was not getting comfortable with any position, so he decided to come to the ER. No evoking factor. Pt's neck pain is R sided and the pain goes down towards the shoulder. Sometimes there is tingling that goes down further. Pt has no hx of injury to the neck or shoulder.   Patient is a 55 y.o. male presenting with musculoskeletal pain. The history is provided by the patient.  Muscle Pain    Past Medical History  Diagnosis Date  . Pneumonia   . Sepsis(995.91)   . Diabetes mellitus without complication ([place])   . HTN (hypertension)   . Stem cells transplant status ([place])   . Cancer ([place])   . Multiple myeloma [facility])    Past Surgical History  Procedure Laterality Date  . Nose surgery      "when I was a child"   History reviewed. No pertinent family history. Social History  Substance Use Topics  . Smoking status: Never Smoker   . Smokeless tobacco: None  . Alcohol Use: No     Comment: occasional    Review of Systems  Constitutional: Positive for activity change.  Musculoskeletal: Positive for myalgias, arthralgias and neck pain. Negative for neck stiffness.  Skin: Negative for wound.  Neurological: Negative for weakness and numbness.      Allergies  Review of patient's allergies indicates no known allergies.  Home Medications   Prior to Admission medications   Medication Sig Start Date End Date Taking? Authorizing Provider  albuterol (PROVENTIL HFA;VENTOLIN HFA) 108 (90 BASE) MCG/ACT inhaler Inhale 1-2 puffs into the lungs every 6  (six) hours as needed for wheezing or shortness of breath. 12/20/14   Fransico Meadow, PA-C  azithromycin (ZITHROMAX) 250 MG tablet Take 1 tablet (250 mg total) by mouth daily. Take first 2 tablets together, then 1 every day until finished. 12/20/14   Fransico Meadow, PA-C  cholecalciferol (VITAMIN D) 1000 UNITS tablet Take 1,000 Units by mouth daily.    Historical Provider, MD  diazepam (VALIUM) 5 MG tablet Take 1 tablet (5 mg total) by mouth every 8 (eight) hours as needed for anxiety. 06/20/15   Varney Biles, MD  lenalidomide (REVLIMID) 15 MG capsule Take 15 mg by mouth daily.  01/27/14   Historical Provider, MD  metFORMIN (GLUCOPHAGE) 500 MG tablet Take 1 tablet (500 mg total) by mouth 2 (two) times daily with a meal. 05/17/11 12/20/14  Nat Christen, MD  Multiple Vitamin (MULTIVITAMIN WITH MINERALS) TABS tablet Take 1 tablet by mouth daily.    Historical Provider, MD  naproxen (NAPROSYN) 500 MG tablet Take 1 tablet (500 mg total) by mouth 2 (two) times daily. 06/20/15   Varney Biles, MD  ramipril (ALTACE) 5 MG capsule Take 5 mg by mouth daily.    Historical Provider, MD   BP 151/90 mmHg  Pulse 88  Temp(Src) 98 F (36.7 C) (Oral)  Resp 18  Ht _0  (1.727 m)  Wt 218 lb (98.884 kg)  BMI 33.15 kg/m2 Physical Exam  Constitutional: He is oriented to person,  place, and time. He appears well-developed.  HENT:  Head: Atraumatic.  Neck: Neck supple.  Cardiovascular: Normal rate and intact distal pulses.   Pulmonary/Chest: Effort normal.  Musculoskeletal:  Reproducible tenderness and palpable spasm in the R paraspinal region and R deltoid. Pt is neurovascualrly intact.   Neurological: He is alert and oriented to person, place, and time.  Skin: Skin is warm.  Nursing note and vitals reviewed.   ED Course  Procedures (including critical care time) Labs Review Labs Reviewed - No data to display  Imaging Review No results found. I have personally reviewed and evaluated these images and lab  results as part of my medical decision-making.   EKG Interpretation None      MDM   Final diagnoses:  Muscle spasm of right shoulder    Pt with R sided pain of the neck and shoulder. Symptoms likely due to muscle spasm. Currently, no numbness or tingling and he has no midline cspine tenderness or reproducible symptoms with neck movement. Cervical impingement, muscle spasm in the ddx. Advised muscle relaxant, heat, and nsaids with exercises - if not better, he is to see his pcp.     Varney Biles, MD 06/20/15 [phone]

## 2015-06-20 NOTE — ED Notes (Signed)
Pt c/o pain to right shoulder and neck; pt denies any injury

## 2015-06-20 NOTE — Discharge Instructions (Signed)
Heat Therapy °Heat therapy can help ease sore, stiff, injured, and tight muscles and joints. Heat relaxes your muscles, which may help ease your pain.  °RISKS AND COMPLICATIONS °If you have any of the following conditions, do not use heat therapy unless your health care provider has approved: °· Poor circulation. °· Healing wounds or scarred skin in the area being treated. °· Diabetes, heart disease, or high blood pressure. °· Not being able to feel (numbness) the area being treated. °· Unusual swelling of the area being treated. °· Active infections. °· Blood clots. °· Cancer. °· Inability to communicate pain. This may include young children and people who have problems with their brain function (dementia). °· Pregnancy. °Heat therapy should only be used on old, pre-existing, or long-lasting (chronic) injuries. Do not use heat therapy on new injuries unless directed by your health care provider. °HOW TO USE HEAT THERAPY °There are several different kinds of heat therapy, including: °· Moist heat pack. °· Warm water bath. °· Hot water bottle. °· Electric heating pad. °· Heated gel pack. °· Heated wrap. °· Electric heating pad. °Use the heat therapy method suggested by your health care provider. Follow your health care provider's instructions on when and how to use heat therapy. °GENERAL HEAT THERAPY RECOMMENDATIONS °· Do not sleep while using heat therapy. Only use heat therapy while you are awake. °· Your skin may turn pink while using heat therapy. Do not use heat therapy if your skin turns red. °· Do not use heat therapy if you have new pain. °· High heat or long exposure to heat can cause burns. Be careful when using heat therapy to avoid burning your skin. °· Do not use heat therapy on areas of your skin that are already irritated, such as with a rash or sunburn. °SEEK MEDICAL CARE IF: °· You have blisters, redness, swelling, or numbness. °· You have new pain. °· Your pain is worse. °MAKE SURE  YOU: °· Understand these instructions. °· Will watch your condition. °· Will get help right away if you are not doing well or get worse. °  °This information is not intended to replace advice given to you by your health care provider. Make sure you discuss any questions you have with your health care provider. °  °Document Released: 04/02/2011 Document Revised: 01/29/2014 Document Reviewed: 03/03/2013 °Elsevier Interactive Patient Education ©2016 Elsevier Inc. ° °Muscle Cramps and Spasms °Muscle cramps and spasms occur when a muscle or muscles tighten and you have no control over this tightening (involuntary muscle contraction). They are a common problem and can develop in any muscle. The most common place is in the calf muscles of the leg. Both muscle cramps and muscle spasms are involuntary muscle contractions, but they also have differences:  °· Muscle cramps are sporadic and painful. They may last a few seconds to a quarter of an hour. Muscle cramps are often more forceful and last longer than muscle spasms. °· Muscle spasms may or may not be painful. They may also last just a few seconds or much longer. °CAUSES  °It is uncommon for cramps or spasms to be due to a serious underlying problem. In many cases, the cause of cramps or spasms is unknown. Some common causes are:  °· Overexertion.   °· Overuse from repetitive motions (doing the same thing over and over).   °· Remaining in a certain position for a long period of time.   °· Improper preparation, form, or technique while performing a sport or activity.   °·   Dehydration.   °· Injury.   °· Side effects of some medicines.   °· Abnormally low levels of the salts and ions in your blood (electrolytes), especially potassium and calcium. This could happen if you are taking water pills (diuretics) or you are pregnant.   °Some underlying medical problems can make it more likely to develop cramps or spasms. These include, but are not limited to:  °· Diabetes.    °· Parkinson disease.   °· Hormone disorders, such as thyroid problems.   °· Alcohol abuse.   °· Diseases specific to muscles, joints, and bones.   °· Blood vessel disease where not enough blood is getting to the muscles.   °HOME CARE INSTRUCTIONS  °· Stay well hydrated. Drink enough water and fluids to keep your urine clear or pale yellow. °· It may be helpful to massage, stretch, and relax the affected muscle. °· For tight or tense muscles, use a warm towel, heating pad, or hot shower water directed to the affected area. °· If you are sore or have pain after a cramp or spasm, applying ice to the affected area may relieve discomfort. °¨ Put ice in a plastic bag. °¨ Place a towel between your skin and the bag. °¨ Leave the ice on for 15-20 minutes, 03-04 times a day. °· Medicines used to treat a known cause of cramps or spasms may help reduce their frequency or severity. Only take over-the-counter or prescription medicines as directed by your caregiver. °SEEK MEDICAL CARE IF:  °Your cramps or spasms get more severe, more frequent, or do not improve over time.  °MAKE SURE YOU:  °· Understand these instructions. °· Will watch your condition. °· Will get help right away if you are not doing well or get worse. °  °This information is not intended to replace advice given to you by your health care provider. Make sure you discuss any questions you have with your health care provider. °  °Document Released: 06/30/2001 Document Revised: 05/05/2012 Document Reviewed: 12/26/2011 °Elsevier Interactive Patient Education ©2016 Elsevier Inc. ° °

## 2015-06-21 DIAGNOSIS — C9001 Multiple myeloma in remission: Secondary | ICD-10-CM | POA: Diagnosis not present

## 2015-07-08 DIAGNOSIS — I10 Essential (primary) hypertension: Secondary | ICD-10-CM | POA: Diagnosis not present

## 2015-07-08 DIAGNOSIS — Z85828 Personal history of other malignant neoplasm of skin: Secondary | ICD-10-CM | POA: Diagnosis not present

## 2015-07-08 DIAGNOSIS — Z7982 Long term (current) use of aspirin: Secondary | ICD-10-CM | POA: Diagnosis not present

## 2015-07-08 DIAGNOSIS — Z7984 Long term (current) use of oral hypoglycemic drugs: Secondary | ICD-10-CM | POA: Diagnosis not present

## 2015-07-08 DIAGNOSIS — E119 Type 2 diabetes mellitus without complications: Secondary | ICD-10-CM | POA: Diagnosis not present

## 2015-07-08 DIAGNOSIS — Z79899 Other long term (current) drug therapy: Secondary | ICD-10-CM | POA: Diagnosis not present

## 2015-07-08 DIAGNOSIS — Z9484 Stem cells transplant status: Secondary | ICD-10-CM | POA: Diagnosis not present

## 2015-07-08 DIAGNOSIS — C9000 Multiple myeloma not having achieved remission: Secondary | ICD-10-CM | POA: Diagnosis not present

## 2015-07-18 DIAGNOSIS — C9000 Multiple myeloma not having achieved remission: Secondary | ICD-10-CM | POA: Diagnosis not present

## 2015-07-30 ENCOUNTER — Encounter (HOSPITAL_COMMUNITY): Payer: Medicare Other | Attending: Hematology | Admitting: Hematology

## 2015-07-30 ENCOUNTER — Encounter (HOSPITAL_COMMUNITY): Payer: Self-pay | Admitting: Hematology

## 2015-07-30 VITALS — BP 129/84 | HR 81 | Temp 97.9°F | Resp 18 | Ht 66.0 in | Wt 225.8 lb

## 2015-07-30 DIAGNOSIS — D696 Thrombocytopenia, unspecified: Secondary | ICD-10-CM

## 2015-07-30 DIAGNOSIS — D6481 Anemia due to antineoplastic chemotherapy: Secondary | ICD-10-CM

## 2015-07-30 DIAGNOSIS — C9001 Multiple myeloma in remission: Secondary | ICD-10-CM

## 2015-07-30 DIAGNOSIS — E119 Type 2 diabetes mellitus without complications: Secondary | ICD-10-CM | POA: Insufficient documentation

## 2015-07-30 DIAGNOSIS — T451X5A Adverse effect of antineoplastic and immunosuppressive drugs, initial encounter: Secondary | ICD-10-CM

## 2015-07-30 DIAGNOSIS — D72819 Decreased white blood cell count, unspecified: Secondary | ICD-10-CM

## 2015-07-30 DIAGNOSIS — D649 Anemia, unspecified: Secondary | ICD-10-CM | POA: Insufficient documentation

## 2015-07-30 HISTORY — DX: Multiple myeloma in remission: C90.01

## 2015-07-30 MED ORDER — LENALIDOMIDE 15 MG PO CAPS: 15.0000 mg | ORAL_CAPSULE | Freq: Every day | ORAL | Status: DC

## 2015-07-30 NOTE — Patient Instructions (Signed)
[place] at [facility] Discharge Instructions  RECOMMENDATIONS MADE BY THE CONSULTANT AND ANY TEST RESULTS WILL BE SENT TO YOUR REFERRING PHYSICIAN.  You were seen by Dr. Irene Limbo today. Lab work every 4 weeks. Your next lab work is due on July 24th. Return to clinic in 6 weeks for follow-up. Call clinic with any questions or concerns.   Thank you for choosing [place] at [facility] to provide your oncology and hematology care.  To afford each patient quality time with our provider, please arrive at least 15 minutes before your scheduled appointment time.   Beginning January 23rd 2017 lab work for the [facility] will be done in the  Main lab at [facility] on 1st floor. If you have a lab appointment with the [facility] please come in thru the  Main Entrance and check in at the main information desk  You need to re-schedule your appointment should you arrive 10 or more minutes late.  We strive to give you quality time with our providers, and arriving late affects you and other patients whose appointments are after yours.  Also, if you no show three or more times for appointments you may be dismissed from the clinic at the providers discretion.     Again, thank you for choosing [facility].  Our hope is that these requests will decrease the amount of time that you wait before being seen by our physicians.       _____________________________________________________________  Should you have questions after your visit to [facility], please contact our office at [phone] between the hours of 8:30 a.m. and 4:30 p.m.  Voicemails left after 4:30 p.m. will not be returned until the following business day.  For prescription refill requests, have your pharmacy contact our office.         Resources For Cancer Patients and their Caregivers ? American Cancer Society: Can assist with transportation, wigs, general needs,  runs Look Good Feel Better.        563-231-6689 ? Cancer Care: Provides financial assistance, online support groups, medication/co-pay assistance.  1-800-813-HOPE 309-073-2655) ? [place] Assists [place] cancer patients and their families through emotional , educational and financial support.  681-043-5228 ? [place] DSS Where to apply for food stamps, Medicaid and utility assistance. (802)308-2191 ? [facility]: Transportation to medical appointments. (937) 435-3849 ? Social Security Administration: May apply for disability if have a Stage IV cancer. 848-354-5330 (812) 419-2050 ? [employer], Disability and Transit Services: Assists with nutrition, care and transit needs. [place] Support Programs: @10RELATIVEDAYS @ > Cancer Support Group  2nd Tuesday of the month 1pm-2pm, Journey Room  > Creative Journey  3rd Tuesday of the month 1130am-1pm, Journey Room  > Look Good Feel Better  1st Wednesday of the month 10am-12 noon, Journey Room (Call [place] to register [phone])

## 2015-07-30 NOTE — Progress Notes (Signed)
[facility]    HEMATOLOGY/ONCOLOGY CONSULTATION NOTE  Date of Service: 07/30/2015  Patient Care Team: Yevette Edwards, NP as PCP - General (Family Medicine)  CHIEF COMPLAINTS/PURPOSE OF CONSULTATION:  . Management of multiple myeloma  HISTORY OF PRESENTING ILLNESS:  John Singleton is a wonderful 55 y.o. male who has been referred to [place] by Dr Raechel Chute, Sunday Spillers, NP Ever[place] All MD  for evaluation and management of Multiple Myeloma.  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  He was subsequently diagnosed with stage III IgG kappa multiple myeloma with presence of bone lesions. He was treated with RVD for 4 cycles along with Zometa and had very good partial response (VGPR). He was subsequently referred to [facility] where he had high-dose chemotherapy with autologous stem cell transplant on 08/07/2012.  Patient notes he did well with his transplanted had no significant complications. He completed 2 years of Zometa treatment.  He has been on maintenance Revlimid after his auto HSCT inanition and 10 mg by mouth daily for 3 months and was then escalated to 15 mg by mouth daily. Which he is currently on. He is on aspirin for VTE prophylaxis.  He was last seen in clinic by Dr. Abran Duke on 05/23/2015 was last seen at [facility] on 07/08/2015 and was noted to be in remission.   Patient for follow-up and has no other acute new symptoms.  He notes that he had 4 spots on head and 1 under his rt eye for which he was seen at [facility] in [facility] and had these removed.  ONCOLOGIC HISTORY   Multiple myeloma  10/01/2011 Initial Diagnosis  Multiple myeloma presenting with chest pain, abnormal laboratory work, stage III disease, IgG kappa  10/08/2011 - 12/31/2011 Chemotherapy  RVD chemotherapy given for 4 cycles with  VGPR  08/07/2012 Transplant  Autologous stem cell transplant, day zero  10/10/2012 - Targeted Therapy  Revlimid 10 mg daily initiated 3 months, then increase to 15 mg daily      MEDICAL HISTORY:  Past Medical History  Diagnosis Date  . Pneumonia   . Sepsis(995.91)   . Diabetes mellitus without complication ([place])   . HTN (hypertension)   . Stem cells transplant status ([place])   . Cancer ([place])   . Multiple myeloma ([facility])   Diabetes on metformin Hypertension on chronic renal Dyslipidemia not on medications.  SURGICAL HISTORY: Past Surgical History  Procedure Laterality Date  . Nose surgery      "when I was a child"    SOCIAL HISTORY: Social History   Social History  . Marital Status: Legally Separated    Spouse Name: N/A  . Number of Children: N/A  . Years of Education: N/A   Occupational History  . Not on file.   Social History Main Topics  . Smoking status: Never Smoker   . Smokeless tobacco: Not on file  . Alcohol Use: No     Comment: occasional  . Drug Use: No  . Sexual Activity: Yes    Birth Control/ Protection: None   Other Topics Concern  . Not on file   Social History Narrative  He has been on disability since 2014 since his diagnosis of multiple myeloma.  FAMILY HISTORY: History reviewed. No pertinent family history.  ALLERGIES:  has No Known Allergies.  MEDICATIONS:  Current Outpatient Prescriptions  Medication Sig Dispense Refill  . acyclovir (ZOVIRAX) 800 MG tablet Take 800 mg by mouth 2 (two) times daily.    Marland Kitchen aspirin 325 MG EC tablet Take 325 mg by mouth daily.    . Calcium Carbonate-Vitamin D 600-400 MG-UNIT tablet Take 1 tablet by mouth 2 (two) times daily.    . cholecalciferol (VITAMIN D) 1000 UNITS tablet Take 1,000 Units by mouth daily.    Marland Kitchen lenalidomide (REVLIMID) 15 MG capsule Take 15 mg by mouth daily. Reported on 07/30/2015    . magnesium oxide (MAG-OX) 400 MG tablet Take 400 mg by mouth daily.    . Multiple Vitamin (MULTIVITAMIN  WITH MINERALS) TABS tablet Take 1 tablet by mouth daily.    . ramipril (ALTACE) 5 MG capsule Take 5 mg by mouth daily.    . simvastatin (ZOCOR) 20 MG tablet Take 20 mg by mouth at bedtime.    . metFORMIN (GLUCOPHAGE) 500 MG tablet Take 1 tablet (500 mg total) by mouth 2 (two) times daily with a meal. 60 tablet 0   No current facility-administered medications for this visit.    REVIEW OF SYSTEMS:    10 Point review of Systems was done is negative except as noted above.  PHYSICAL EXAMINATION: ECOG PERFORMANCE STATUS: 1 - Symptomatic but completely ambulatory  . Filed Vitals:   07/30/15 0950  BP: 129/84  Pulse: 81  Temp: 97.9 F (36.6 C)  Resp: 18   Filed Weights   07/30/15 0950  Weight: 225 lb 12.8 oz (102.422 kg)   .Body mass index is 36.46 kg/(m^2).  GENERAL:alert, in no acute distress and comfortable SKIN: skin color, texture, turgor are normal, no rashes or significant lesions EYES: normal, conjunctiva are pink and non-injected, sclera clear OROPHARYNX:no exudate, no erythema and lips, buccal mucosa, and tongue normal  NECK: supple, no JVD, thyroid normal size, non-tender, without nodularity LYMPH:  no palpable lymphadenopathy in the cervical, axillary or inguinal LUNGS: clear to auscultation with normal respiratory effort HEART: regular rate & rhythm,  no murmurs and no lower extremity edema ABDOMEN: abdomen soft, non-tender, normoactive bowel sounds , No palpable hepatosplenomegaly. Musculoskeletal: no cyanosis of digits and no clubbing  PSYCH: alert & oriented x 3 with fluent speech NEURO: no focal motor/sensory deficits  LABORATORY DATA:  I have reviewed the data as listed          RADIOGRAPHIC STUDIES: I have personally reviewed the radiological images as listed and agreed with the findings in the report. No results found.  ASSESSMENT & PLAN:   55 year old Caucasian male with  #1 history of stage III IgG kappa multiple myeloma with bony  lesions Status post 4 cycles of RVD leading to VGPR followed by Auto HSCT and currently in CR. On maintenance Revlimid. Completed 42yr of zometa.  #2 thrombocytopenia- mild to moderate in the 60-100k range likely due to Revlimid. Plan - patient was recently evaluated by his transplant team at [facility]. He is noted to be in complete remission at this time . M protein negative . Last evaluation was on 07/18/2015 . -Continue Revlimid 15 mg by mouth daily  -Avoid NSAIDs with thrombocytopenia -Continue monthly CBC, CMP  -Return to clinic in 2 months with CBC, CMP, myeloma panel him a serum free light chain, UPEP with Dr. PWhitney Musefor continued management    #3 multiple skin lesions on head and 1 on the face. Rule out nonmelanoma skin cancer. Risk of this might be increased  with chronic Revlimid therapy. Plan -Patient has been seen by dermatology at [facility] and needs to continue follow-up with them.  #4 obesity- patient notes he is working on diet and exercise.  #5 . Patient Active Problem List   Diagnosis Date Noted  . Multiple myeloma in remission ([place]) 07/30/2015  . DM2 (diabetes mellitus, type 2) ([place]) 07/30/2015  . Thrombocytopenia ([place]) 07/30/2015  . Leucopenia 07/30/2015  . Anemia 07/30/2015  . HTN (hypertension)   Plan -Continue follow-up with primary care physician for monitoring and management of other medical comorbidities.  Return to care for labs and a month and with labs and clinic visit with Dr. Whitney Muse in 2 months.  All of the patients questions were answered with apparent satisfaction. The patient knows to call the clinic with any problems, questions or concerns.  I spent 60 minutes counseling the patient face to face. The total time spent in the appointment was 70 minutes and more than 50% was on counseling and direct patient cares.    Sullivan Lone MD [place] AAHIVMS [facility] Hematology/Oncology Physician [facility]  (Office):        [phone] (Work cell):  [phone] (Fax):           [phone]  07/30/2015 10:02 AM

## 2015-08-03 DIAGNOSIS — I10 Essential (primary) hypertension: Secondary | ICD-10-CM | POA: Diagnosis not present

## 2015-08-03 DIAGNOSIS — Z6832 Body mass index (BMI) 32.0-32.9, adult: Secondary | ICD-10-CM | POA: Diagnosis not present

## 2015-08-03 DIAGNOSIS — E119 Type 2 diabetes mellitus without complications: Secondary | ICD-10-CM | POA: Diagnosis not present

## 2015-08-03 DIAGNOSIS — C9001 Multiple myeloma in remission: Secondary | ICD-10-CM | POA: Diagnosis not present

## 2015-08-26 ENCOUNTER — Encounter (HOSPITAL_COMMUNITY): Payer: Medicare Other | Attending: Hematology & Oncology

## 2015-08-26 DIAGNOSIS — C9001 Multiple myeloma in remission: Secondary | ICD-10-CM | POA: Insufficient documentation

## 2015-08-26 LAB — CBC WITH DIFFERENTIAL/PLATELET
BASOS ABS: 0.1 10*3/uL (ref 0.0–0.1)
Basophils Relative: 3 %
EOS ABS: 0.3 10*3/uL (ref 0.0–0.7)
Eosinophils Relative: 13 %
HCT: 33.1 % — ABNORMAL LOW (ref 39.0–52.0)
Hemoglobin: 11.7 g/dL — ABNORMAL LOW (ref 13.0–17.0)
LYMPHS PCT: 35 %
Lymphs Abs: 0.8 10*3/uL (ref 0.7–4.0)
MCH: 35.3 pg — ABNORMAL HIGH (ref 26.0–34.0)
MCHC: 35.3 g/dL (ref 30.0–36.0)
MCV: 100 fL (ref 78.0–100.0)
MONO ABS: 0.3 10*3/uL (ref 0.1–1.0)
Monocytes Relative: 13 %
NEUTROS PCT: 36 %
Neutro Abs: 0.7 10*3/uL — ABNORMAL LOW (ref 1.7–7.7)
Platelets: 53 10*3/uL — ABNORMAL LOW (ref 150–400)
RBC: 3.31 MIL/uL — AB (ref 4.22–5.81)
RDW: 14.5 % (ref 11.5–15.5)
WBC: 2.2 10*3/uL — AB (ref 4.0–10.5)

## 2015-08-26 LAB — COMPREHENSIVE METABOLIC PANEL
ALBUMIN: 3.9 g/dL (ref 3.5–5.0)
ALT: 51 U/L (ref 17–63)
ANION GAP: 4 — AB (ref 5–15)
AST: 32 U/L (ref 15–41)
Alkaline Phosphatase: 77 U/L (ref 38–126)
BUN: 22 mg/dL — ABNORMAL HIGH (ref 6–20)
CO2: 19 mmol/L — AB (ref 22–32)
Calcium: 7.9 mg/dL — ABNORMAL LOW (ref 8.9–10.3)
Chloride: 107 mmol/L (ref 101–111)
Creatinine, Ser: 1.22 mg/dL (ref 0.61–1.24)
GFR calc Af Amer: >60 mL/min (ref >60)
GFR calc non Af Amer: >60 mL/min (ref >60)
GLUCOSE: 203 mg/dL — AB (ref 65–99)
POTASSIUM: 3.8 mmol/L (ref 3.5–5.1)
SODIUM: 130 mmol/L — AB (ref 135–145)
Total Bilirubin: 0.4 mg/dL (ref 0.3–1.2)
Total Protein: 7.1 g/dL (ref 6.5–8.1)

## 2015-08-29 LAB — MULTIPLE MYELOMA PANEL, SERUM
ALPHA2 GLOB SERPL ELPH-MCNC: 0.7 g/dL (ref 0.4–1.0)
Albumin SerPl Elph-Mcnc: 3.7 g/dL (ref 2.9–4.4)
Albumin/Glob SerPl: 1.3 (ref 0.7–1.7)
Alpha 1: 0.2 g/dL (ref 0.0–0.4)
B-Globulin SerPl Elph-Mcnc: 1 g/dL (ref 0.7–1.3)
Gamma Glob SerPl Elph-Mcnc: 1 g/dL (ref 0.4–1.8)
Globulin, Total: 2.9 g/dL (ref 2.2–3.9)
IGA: 237 mg/dL (ref 90–386)
IGG (IMMUNOGLOBIN G), SERUM: 1028 mg/dL (ref 700–1600)
IGM, SERUM: 19 mg/dL — AB (ref 20–172)
TOTAL PROTEIN ELP: 6.6 g/dL (ref 6.0–8.5)

## 2015-09-06 ENCOUNTER — Other Ambulatory Visit (HOSPITAL_COMMUNITY): Payer: Self-pay | Admitting: Oncology

## 2015-09-06 DIAGNOSIS — D696 Thrombocytopenia, unspecified: Secondary | ICD-10-CM

## 2015-09-06 DIAGNOSIS — T451X5A Adverse effect of antineoplastic and immunosuppressive drugs, initial encounter: Secondary | ICD-10-CM

## 2015-09-06 DIAGNOSIS — D6481 Anemia due to antineoplastic chemotherapy: Secondary | ICD-10-CM

## 2015-09-08 ENCOUNTER — Telehealth: Payer: Self-pay | Admitting: *Deleted

## 2015-09-08 NOTE — Telephone Encounter (Signed)
Received outside voice message about patient regarding refill on Revlimid.  Pt seen once in early July by Dr. Irene Limbo at [facility].  Per office note, pt was to return to clinic (AP) in 6 weeks, which would have been this week.  Pt has no follow up apts.  Dr. Irene Limbo currently out of [place].  This RN spoke to RN at [facility] to inform them of situation.  Also mentioned pt needs apt for follow up.  Forestine Na RN stated she would get in touch with patient/follow up on refill.

## 2015-09-09 ENCOUNTER — Ambulatory Visit (HOSPITAL_COMMUNITY): Payer: Medicare Other

## 2015-09-14 ENCOUNTER — Other Ambulatory Visit (HOSPITAL_COMMUNITY): Payer: Self-pay | Admitting: Oncology

## 2015-09-14 MED ORDER — LENALIDOMIDE 15 MG PO CAPS: 15.0000 mg | ORAL_CAPSULE | Freq: Every day | ORAL | 1 refills | Status: DC

## 2015-09-16 ENCOUNTER — Encounter (HOSPITAL_COMMUNITY): Payer: Self-pay | Admitting: Oncology

## 2015-09-16 ENCOUNTER — Encounter (HOSPITAL_COMMUNITY): Payer: Medicare Other | Admitting: Oncology

## 2015-09-16 DIAGNOSIS — C9001 Multiple myeloma in remission: Secondary | ICD-10-CM | POA: Diagnosis not present

## 2015-09-16 NOTE — Assessment & Plan Note (Addendum)
Multiple Myeloma IgG Kappa stage III, having obtained a VGPR after 4 cycles of VRD. Now s/p autologous PBSCT with Day 0 08/07/2012. Post transplant myeloma markers and BMBX demonstrated a stringent CR at 100 days.  Has completed 2 years of Zometa and will not require unless relapses and is receiving active therapy. Continue same maintenance Revlimid with dose of 15 mg daily. Can consider transition to dosing schedule of 21 days of every 28 day cycle.   Oncology history is updated.  Labs as scheduled: CBC diff, CMET, multiple myeloma panel, anemia panel.  I personally reviewed and went over laboratory results with the patient.  The results are noted within this dictation. Pancytopenia is long standing according to record review.  He recently was seen in survivorship clinic at [facility] by Allayne Butcher, NP on 07/09/2015.  Her assessment and plan follows below: Multiple Myeloma IgG Kappa stage III, having obtained a VGPR after 4 cycles of VRD. Now s/p autologous PBSCT with Day 0 08/07/2012. Post transplant myeloma markers and BMBX demonstrated a stringent CR at 100 days.  Has completed 2 years of Zometa and will not require unless relapses and is receiving active therapy. Continue same maintenance Revlimid with dose of 15 mg daily. Can consider transition to dosing schedule of 21 days of every 28 day cycle.  ID Prophylaxis On ACV while on Revlimid. Ok to consider discontinuation locally on maintenance Revlimid. DM Managed locally by PCP.  Late Effects Surveillance No evidence of late effects. No evidence of organ dysfunction. No additional testing or referral needed. Labs available in [place]; reviewed. Health Maintenance Follow up with PCP for routine wellness and cancer screening including but not limited to BP evaluation and management, cholesterol screening, diabetes screening, bone density testing, prostate screening, colonoscopy. See dentist twice annually for complete dental/oral exam. See  opthomologist once to twice annually for eye/retina exam. See dermatology annually for skin survey. Flu shot annually in season. Heart healthy lifestyle recommended including health diet, exercise, healthy weight. Calcium with vitamin D daily recommended for bone health. Sun protection for skin cancer prevention. Disposition  We will see him in follow up annually in July in the survivorship clinic. He is advised to seek medical attention as needed in the interim.  We will continue with monthly labs: CBC diff, CMET, SPEP+IFE, light chain assay, IgG, IgA, IgM, B2M, CRP, ESR.  I do not see record of a bone density exam.  Brianna reports that he has not had one to his recollection.  Order is placed for bone density exam to be completed within the next 2 weeks.  He is to continue with Ca++ and Vit D.    He reports a colonoscopy about 3 years ago.  He cannot recall who performed this screening test.    He notes that he does ascertain his influenza vaccine annually and he is encouraged to do so.  We would be glad to administer here in the clinic in the near future.    Return in 8 weeks for follow-up, at which time we can consider reducing Revlimid dosing to 21/28 days per [facility] recommendations and discontinuing acyclovir.

## 2015-09-16 NOTE — Patient Instructions (Signed)
[place] at [facility] Discharge Instructions  RECOMMENDATIONS MADE BY THE CONSULTANT AND ANY TEST RESULTS WILL BE SENT TO YOUR REFERRING PHYSICIAN.   You saw Kirby Crigler PA-C today.  Labs next week as scheduled, then every 4 weeks. Get a bone density within the next 2 months. Follow up with Dr. Whitney Muse in 2 months. Get a flu shot in early October.  Thank you for choosing [place] at [facility] to provide your oncology and hematology care.  To afford each patient quality time with our provider, please arrive at least 15 minutes before your scheduled appointment time.   Beginning January 23rd 2017 lab work for the [facility] will be done in the  Main lab at [facility] on 1st floor. If you have a lab appointment with the [facility] [place] please come in thru the  Main Entrance and check in at the main information desk  You need to re-schedule your appointment should you arrive 10 or more minutes late.  We strive to give you quality time with our providers, and arriving late affects you and other patients whose appointments are after yours.  Also, if you no show three or more times for appointments you may be dismissed from the clinic at the providers discretion.     Again, thank you for choosing [facility]  Our hope is that these requests will decrease the amount of time that you wait before being seen by our physicians.       _____________________________________________________________  Should you have questions after your visit to [facility], please contact our office at [phone] between the hours of 8:30 a.m. and 4:30 p.m.  Voicemails left after 4:30 p.m. will not be returned until the following business day.  For prescription refill requests, have your pharmacy contact our office.         Resources For Cancer Patients and their Caregivers ? American Cancer Society: Can assist with transportation,  wigs, general needs, runs Look Good Feel Better.        [phone] ? Cancer Care: Provides financial assistance, online support groups, medication/co-pay assistance.  1-800-813-HOPE (575)801-4583) ? [facility] Assists [place] cancer patients and their families through emotional , educational and financial support.  978-409-4980 ? [place] DSS Where to apply for food stamps, Medicaid and utility assistance. 3611432872 ? RCATS: Transportation to medical appointments. ([phone] 548-0037 ? Social Security Administration: May apply for disability if have a Stage IV cancer. [phone] [phone] ? [employer], Disability and Transit Services: Assists with nutrition, care and transit needs. [place] Support Programs: @10RELATIVEDAYS @ > Cancer Support Group  2nd Tuesday of the month 1pm-2pm, Journey Room  > Creative Journey  3rd Tuesday of the month 1130am-1pm, Journey Room  > Look Good Feel Better  1st Wednesday of the month 10am-12 noon, Journey Room (Call [place] to register [phone])

## 2015-09-16 NOTE — Progress Notes (Signed)
John Singleton, [place] [address]  Multiple myeloma in remission [facility]) - Plan: Kappa/lambda light chains, Beta 2 microglobuline, serum, IgG, IgA, IgM, Immunofixation electrophoresis, Protein electrophoresis, serum, Sedimentation rate, C-reactive protein, CBC with Differential, Comprehensive metabolic panel, DG Bone Density  CURRENT THERAPY: Maintenance Revlimid, 15 mg daily.  INTERVAL HISTORY: John Singleton 55 y.o. male returns for followup of IgG Kappa Multiple myeloma, Stage III, having obtained a VGPR after 4 cycles of VRD. Now s/p autologous PBSCT with Day 0 being on 08/07/2012. Post transplant myeloma markers and BMBX demonstrated a stringent CR at 100 days. Continues care with his primary oncologist. Has completed 2 years of Zometa and will not require unless relapses and is receiving active therapy. Continue same maintenance Revlimid with dose of 15 mg daily. Can consider locally transition to dosing schedule of 21 days of every 28 day cycle.   He is doing well.  He denies any complaints today.  He is compliant with his Revlimid.    Review of Systems  Constitutional: Negative for chills, fever and weight loss.  HENT: Negative.   Eyes: Negative.   Respiratory: Negative.  Negative for cough.   Cardiovascular: Negative.  Negative for chest pain.  Gastrointestinal: Negative.  Negative for blood in stool, constipation, diarrhea, melena, nausea and vomiting.  Genitourinary: Negative.  Negative for hematuria.  Musculoskeletal: Negative.   Skin: Negative.   Neurological: Negative.  Negative for weakness and headaches.  Endo/Heme/Allergies: Negative.  Does not bruise/bleed easily.  Psychiatric/Behavioral: Negative.     Past Medical History:  Diagnosis Date  . Cancer ([place])   . Diabetes mellitus without complication ([place])   . HTN (hypertension)   . Multiple myeloma ([place])   . Multiple myeloma in remission ([place]) 07/30/2015  . Pneumonia   .  Sepsis(995.91)   . Stem cells transplant status [facility])     Past Surgical History:  Procedure Laterality Date  . NOSE SURGERY     "when I was a child"    History reviewed. No pertinent family history.  Social History   Social History  . Marital status: Legally Separated    Spouse name: N/A  . Number of children: N/A  . Years of education: N/A   Social History Main Topics  . Smoking status: Never Smoker  . Smokeless tobacco: Never Used  . Alcohol use No     Comment: occasional  . Drug use: No  . Sexual activity: Yes    Birth control/ protection: None   Other Topics Concern  . None   Social History Narrative  . None     PHYSICAL EXAMINATION  ECOG PERFORMANCE STATUS: 0 - Asymptomatic  Vitals:   09/16/15 0831  BP: 114/88  Pulse: 91  Resp: 16  Temp: 98.3 F (36.8 C)    GENERAL:alert, no distress, well nourished, well developed, comfortable, cooperative, smiling and unaccompanied SKIN: skin color, texture, turgor are normal, no rashes or significant lesions HEAD: Normocephalic, No masses, lesions, tenderness or abnormalities EYES: normal, EOMI, Conjunctiva are pink and non-injected EARS: External ears normal OROPHARYNX:lips, buccal mucosa, and tongue normal and mucous membranes are moist  NECK: supple, trachea midline LYMPH:  no palpable lymphadenopathy BREAST:not examined LUNGS: clear to auscultation and percussion HEART: regular rate & rhythm, no murmurs, no gallops, S1 normal and S2 normal ABDOMEN:abdomen soft, non-tender and normal bowel sounds BACK: Back symmetric, no curvature. EXTREMITIES:less then 2 second capillary refill, no joint deformities, effusion, or inflammation, no  edema, no skin discoloration, no clubbing, no cyanosis  NEURO: alert & oriented x 3 with fluent speech, no focal motor/sensory deficits, gait normal   LABORATORY DATA: CBC    Component Value Date/Time   WBC 2.2 (L) 08/26/2015 0817   RBC 3.31 (L) 08/26/2015 0817   HGB 11.7  (L) 08/26/2015 0817   HCT 33.1 (L) 08/26/2015 0817   PLT 53 (L) 08/26/2015 0817   MCV 100.0 08/26/2015 0817   MCH 35.3 (H) 08/26/2015 0817   MCHC 35.3 08/26/2015 0817   RDW 14.5 08/26/2015 0817   LYMPHSABS 0.8 08/26/2015 0817   MONOABS 0.3 08/26/2015 0817   EOSABS 0.3 08/26/2015 0817   BASOSABS 0.1 08/26/2015 0817      Chemistry      Component Value Date/Time   NA 130 (L) 08/26/2015 0817   K 3.8 08/26/2015 0817   CL 107 08/26/2015 0817   CO2 19 (L) 08/26/2015 0817   BUN 22 (H) 08/26/2015 0817   CREATININE 1.22 08/26/2015 0817      Component Value Date/Time   CALCIUM 7.9 (L) 08/26/2015 0817   ALKPHOS 77 08/26/2015 0817   AST 32 08/26/2015 0817   ALT 51 08/26/2015 0817   BILITOT 0.4 08/26/2015 0817      Lab Results  Component Value Date   PROT 7.1 08/26/2015   IGGSERUM 1,028 08/26/2015   IGA 237 08/26/2015   IGMSERUM 19 (L) 08/26/2015     PENDING LABS:   RADIOGRAPHIC STUDIES:  No results found.   PATHOLOGY:    ASSESSMENT AND PLAN:  Multiple myeloma in remission (HCC) Multiple Myeloma IgG Kappa stage III, having obtained a VGPR after 4 cycles of VRD. Now s/p autologous PBSCT with Day 0 08/07/2012. Post transplant myeloma markers and BMBX demonstrated a stringent CR at 100 days.  Has completed 2 years of Zometa and will not require unless relapses and is receiving active therapy. Continue same maintenance Revlimid with dose of 15 mg daily. Can consider transition to dosing schedule of 21 days of every 28 day cycle.   Oncology history is updated.  Labs as scheduled: CBC diff, CMET, multiple myeloma panel, anemia panel.  I personally reviewed and went over laboratory results with the patient.  The results are noted within this dictation. Pancytopenia is long standing according to record review.  He recently was seen in survivorship clinic at [facility] by Allayne Butcher, NP on 07/09/2015.  Her assessment and plan follows below: Multiple Myeloma IgG Kappa stage III,  having obtained a VGPR after 4 cycles of VRD. Now s/p autologous PBSCT with Day 0 08/07/2012. Post transplant myeloma markers and BMBX demonstrated a stringent CR at 100 days.  Has completed 2 years of Zometa and will not require unless relapses and is receiving active therapy. Continue same maintenance Revlimid with dose of 15 mg daily. Can consider transition to dosing schedule of 21 days of every 28 day cycle.  ID Prophylaxis On ACV while on Revlimid. Ok to consider discontinuation locally on maintenance Revlimid. DM Managed locally by PCP.  Late Effects Surveillance No evidence of late effects. No evidence of organ dysfunction. No additional testing or referral needed. Labs available in [place]; reviewed. Health Maintenance Follow up with PCP for routine wellness and cancer screening including but not limited to BP evaluation and management, cholesterol screening, diabetes screening, bone density testing, prostate screening, colonoscopy. See dentist twice annually for complete dental/oral exam. See opthomologist once to twice annually for eye/retina exam. See dermatology annually for skin survey.  Flu shot annually in season. Heart healthy lifestyle recommended including health diet, exercise, healthy weight. Calcium with vitamin D daily recommended for bone health. Sun protection for skin cancer prevention. Disposition  We will see him in follow up annually in July in the survivorship clinic. He is advised to seek medical attention as needed in the interim.  We will continue with monthly labs: CBC diff, CMET, SPEP+IFE, light chain assay, IgG, IgA, IgM, B2M, CRP, ESR.  I do not see record of a bone density exam.  John Singleton reports that he has not had one to his recollection.  Order is placed for bone density exam to be completed within the next 2 weeks.  He is to continue with Ca++ and Vit D.    He reports a colonoscopy about 3 years ago.  He cannot recall who performed this screening test.     He notes that he does ascertain his influenza vaccine annually and he is encouraged to do so.  We would be glad to administer here in the clinic in the near future.    Return in 8 weeks for follow-up, at which time we can consider reducing Revlimid dosing to 21/28 days per [facility] recommendations and discontinuing acyclovir.     ORDERS PLACED FOR THIS ENCOUNTER: Orders Placed This Encounter  Procedures  . DG Bone Density  . Kappa/lambda light chains  . Beta 2 microglobuline, serum  . IgG, IgA, IgM  . Immunofixation electrophoresis  . Protein electrophoresis, serum  . Sedimentation rate  . C-reactive protein  . CBC with Differential  . Comprehensive metabolic panel    MEDICATIONS PRESCRIBED THIS ENCOUNTER: No orders of the defined types were placed in this encounter.   THERAPY PLAN:  Continue with maintenance Revlimid daily.  All questions were answered. The patient knows to call the clinic with any problems, questions or concerns. We can certainly see the patient much sooner if necessary.  Patient and plan discussed with Dr. Ancil Linsey and she is in agreement with the aforementioned.   This note is electronically signed by: Doy Mince 09/16/2015 9:18 AM

## 2015-09-23 ENCOUNTER — Encounter (HOSPITAL_COMMUNITY): Payer: Medicare Other | Attending: Hematology & Oncology

## 2015-09-23 DIAGNOSIS — C9001 Multiple myeloma in remission: Secondary | ICD-10-CM

## 2015-09-23 LAB — COMPREHENSIVE METABOLIC PANEL
ALBUMIN: 3.8 g/dL (ref 3.5–5.0)
ALT: 48 U/L (ref 17–63)
AST: 32 U/L (ref 15–41)
Alkaline Phosphatase: 68 U/L (ref 38–126)
Anion gap: 7 (ref 5–15)
BILIRUBIN TOTAL: 0.7 mg/dL (ref 0.3–1.2)
BUN: 18 mg/dL (ref 6–20)
CO2: 22 mmol/L (ref 22–32)
CREATININE: 1.14 mg/dL (ref 0.61–1.24)
Calcium: 8.7 mg/dL — ABNORMAL LOW (ref 8.9–10.3)
Chloride: 103 mmol/L (ref 101–111)
GFR calc Af Amer: >60 mL/min (ref >60)
Glucose, Bld: 219 mg/dL — ABNORMAL HIGH (ref 65–99)
POTASSIUM: 4 mmol/L (ref 3.5–5.1)
Sodium: 132 mmol/L — ABNORMAL LOW (ref 135–145)
TOTAL PROTEIN: 7 g/dL (ref 6.5–8.1)

## 2015-09-23 LAB — CBC WITH DIFFERENTIAL/PLATELET
BASOS ABS: 0.1 10*3/uL (ref 0.0–0.1)
BASOS PCT: 4 %
Eosinophils Absolute: 0.2 10*3/uL (ref 0.0–0.7)
Eosinophils Relative: 10 %
HEMATOCRIT: 32.8 % — AB (ref 39.0–52.0)
HEMOGLOBIN: 11.7 g/dL — AB (ref 13.0–17.0)
LYMPHS PCT: 36 %
Lymphs Abs: 0.7 10*3/uL (ref 0.7–4.0)
MCH: 35.2 pg — ABNORMAL HIGH (ref 26.0–34.0)
MCHC: 35.7 g/dL (ref 30.0–36.0)
MCV: 98.8 fL (ref 78.0–100.0)
Monocytes Absolute: 0.3 10*3/uL (ref 0.1–1.0)
Monocytes Relative: 16 %
NEUTROS ABS: 0.7 10*3/uL — AB (ref 1.7–7.7)
NEUTROS PCT: 35 %
Platelets: 67 10*3/uL — ABNORMAL LOW (ref 150–400)
RBC: 3.32 MIL/uL — AB (ref 4.22–5.81)
RDW: 15.1 % (ref 11.5–15.5)
WBC: 2 10*3/uL — AB (ref 4.0–10.5)

## 2015-09-23 LAB — C-REACTIVE PROTEIN: CRP: 0.6 mg/dL (ref <1.0)

## 2015-09-23 LAB — SEDIMENTATION RATE: SED RATE: 48 mm/h — AB (ref 0–16)

## 2015-09-24 LAB — IGG, IGA, IGM
IGG (IMMUNOGLOBIN G), SERUM: 967 mg/dL (ref 700–1600)
IgA: 226 mg/dL (ref 90–386)
IgM, Serum: 17 mg/dL — ABNORMAL LOW (ref 20–172)

## 2015-09-24 LAB — BETA 2 MICROGLOBULIN, SERUM: BETA 2 MICROGLOBULIN: 2.1 mg/L (ref 0.6–2.4)

## 2015-09-27 ENCOUNTER — Telehealth (HOSPITAL_COMMUNITY): Payer: Self-pay | Admitting: *Deleted

## 2015-09-27 LAB — PROTEIN ELECTROPHORESIS, SERUM
A/G RATIO SPE: 1.3 (ref 0.7–1.7)
ALBUMIN ELP: 3.8 g/dL (ref 2.9–4.4)
ALPHA-1-GLOBULIN: 0.2 g/dL (ref 0.0–0.4)
ALPHA-2-GLOBULIN: 0.7 g/dL (ref 0.4–1.0)
BETA GLOBULIN: 1 g/dL (ref 0.7–1.3)
GAMMA GLOBULIN: 0.9 g/dL (ref 0.4–1.8)
Globulin, Total: 2.9 g/dL (ref 2.2–3.9)
Total Protein ELP: 6.7 g/dL (ref 6.0–8.5)

## 2015-09-27 LAB — IMMUNOFIXATION ELECTROPHORESIS
IGG (IMMUNOGLOBIN G), SERUM: 972 mg/dL (ref 700–1600)
IgA: 221 mg/dL (ref 90–386)
IgM, Serum: 17 mg/dL — ABNORMAL LOW (ref 20–172)
TOTAL PROTEIN ELP: 6.8 g/dL (ref 6.0–8.5)

## 2015-09-27 LAB — KAPPA/LAMBDA LIGHT CHAINS
KAPPA FREE LGHT CHN: 31 mg/L — AB (ref 3.3–19.4)
KAPPA, LAMDA LIGHT CHAIN RATIO: 1.29 (ref 0.26–1.65)
LAMDA FREE LIGHT CHAINS: 24.1 mg/L (ref 5.7–26.3)

## 2015-09-27 NOTE — Telephone Encounter (Signed)
-----   Message from Baird Cancer, PA-C sent at 09/27/2015  4:43 PM EDT ----- Stable

## 2015-09-27 NOTE — Telephone Encounter (Signed)
Pt aware that labs were stable.

## 2015-10-11 ENCOUNTER — Other Ambulatory Visit (HOSPITAL_COMMUNITY): Payer: Self-pay | Admitting: Emergency Medicine

## 2015-10-11 MED ORDER — LENALIDOMIDE 15 MG PO CAPS: 15.0000 mg | ORAL_CAPSULE | Freq: Every day | ORAL | 1 refills | Status: DC

## 2015-10-11 NOTE — Progress Notes (Signed)
revlimid refilled

## 2015-10-12 ENCOUNTER — Other Ambulatory Visit (HOSPITAL_COMMUNITY): Payer: Self-pay | Admitting: Pharmacist

## 2015-10-17 ENCOUNTER — Ambulatory Visit (HOSPITAL_COMMUNITY)
Admission: RE | Admit: 2015-10-17 | Discharge: 2015-10-17 | Disposition: A | Discharge status: Home / Self Care | Payer: Medicare Other | Source: Ambulatory Visit | Attending: Oncology | Admitting: Oncology

## 2015-10-17 DIAGNOSIS — C9001 Multiple myeloma in remission: Secondary | ICD-10-CM | POA: Diagnosis not present

## 2015-10-17 DIAGNOSIS — M85832 Other specified disorders of bone density and structure, left forearm: Secondary | ICD-10-CM | POA: Diagnosis not present

## 2015-10-17 DIAGNOSIS — M85862 Other specified disorders of bone density and structure, left lower leg: Secondary | ICD-10-CM | POA: Diagnosis not present

## 2015-10-17 DIAGNOSIS — Z13820 Encounter for screening for osteoporosis: Secondary | ICD-10-CM | POA: Diagnosis not present

## 2015-10-17 DIAGNOSIS — M85852 Other specified disorders of bone density and structure, left thigh: Secondary | ICD-10-CM | POA: Diagnosis not present

## 2015-10-21 ENCOUNTER — Encounter (HOSPITAL_COMMUNITY): Payer: Medicare Other

## 2015-10-21 DIAGNOSIS — D6481 Anemia due to antineoplastic chemotherapy: Secondary | ICD-10-CM

## 2015-10-21 DIAGNOSIS — T451X5A Adverse effect of antineoplastic and immunosuppressive drugs, initial encounter: Principal | ICD-10-CM

## 2015-10-21 DIAGNOSIS — C9001 Multiple myeloma in remission: Secondary | ICD-10-CM | POA: Diagnosis not present

## 2015-10-21 LAB — VITAMIN B12: VITAMIN B 12: 167 pg/mL — AB (ref 180–914)

## 2015-10-21 LAB — FOLATE: FOLATE: 20.8 ng/mL (ref >5.9)

## 2015-10-21 LAB — IRON AND TIBC
IRON: 63 ug/dL (ref 45–182)
Saturation Ratios: 18 % (ref 17.9–39.5)
TIBC: 357 ug/dL (ref 250–450)
UIBC: 294 ug/dL

## 2015-10-21 LAB — FERRITIN: Ferritin: 231 ng/mL (ref 24–336)

## 2015-10-23 ENCOUNTER — Encounter (HOSPITAL_COMMUNITY): Payer: Self-pay | Admitting: Oncology

## 2015-10-23 ENCOUNTER — Other Ambulatory Visit (HOSPITAL_COMMUNITY): Payer: Self-pay | Admitting: Oncology

## 2015-10-23 DIAGNOSIS — E538 Deficiency of other specified B group vitamins: Secondary | ICD-10-CM

## 2015-10-23 HISTORY — DX: Deficiency of other specified B group vitamins: E53.8

## 2015-10-25 ENCOUNTER — Telehealth (HOSPITAL_COMMUNITY): Payer: Self-pay | Admitting: *Deleted

## 2015-10-25 DIAGNOSIS — Z9484 Stem cells transplant status: Secondary | ICD-10-CM | POA: Diagnosis not present

## 2015-10-25 DIAGNOSIS — E119 Type 2 diabetes mellitus without complications: Secondary | ICD-10-CM | POA: Diagnosis not present

## 2015-10-25 DIAGNOSIS — I10 Essential (primary) hypertension: Secondary | ICD-10-CM | POA: Diagnosis not present

## 2015-10-25 DIAGNOSIS — Z0000 Encounter for general adult medical examination without abnormal findings: Secondary | ICD-10-CM | POA: Diagnosis not present

## 2015-10-25 DIAGNOSIS — C9001 Multiple myeloma in remission: Secondary | ICD-10-CM | POA: Diagnosis not present

## 2015-10-25 DIAGNOSIS — E785 Hyperlipidemia, unspecified: Secondary | ICD-10-CM | POA: Diagnosis not present

## 2015-10-25 NOTE — Telephone Encounter (Signed)
-----   Message from Baird Cancer, PA-C sent at 10/23/2015 11:18 AM EDT ----- B12 is low!  He must return for additional labs and to start weekly B12 x 4 then monthly.  Labs MUST be completed prior to start of B12.  Can be done the same day.

## 2015-10-28 ENCOUNTER — Encounter (HOSPITAL_COMMUNITY): Payer: Medicare Other | Attending: Hematology & Oncology

## 2015-10-28 VITALS — BP 124/78 | HR 77 | Temp 97.6°F | Resp 18 | Wt 219.8 lb

## 2015-10-28 DIAGNOSIS — E538 Deficiency of other specified B group vitamins: Secondary | ICD-10-CM

## 2015-10-28 DIAGNOSIS — Z23 Encounter for immunization: Secondary | ICD-10-CM

## 2015-10-28 DIAGNOSIS — C9001 Multiple myeloma in remission: Secondary | ICD-10-CM | POA: Insufficient documentation

## 2015-10-28 MED ORDER — INFLUENZA VAC SPLIT QUAD 0.5 ML IM SUSY
PREFILLED_SYRINGE | INTRAMUSCULAR | Status: AC
  Filled 2015-10-28: qty 0.5

## 2015-10-28 MED ORDER — CYANOCOBALAMIN 1000 MCG/ML IJ SOLN
INTRAMUSCULAR | Status: AC
  Filled 2015-10-28: qty 1

## 2015-10-28 MED ORDER — CYANOCOBALAMIN 1000 MCG/ML IJ SOLN
1000.0000 ug | Freq: Once | INTRAMUSCULAR | Status: AC
  Administered 2015-10-28: 1000 ug via INTRAMUSCULAR

## 2015-10-28 MED ORDER — INFLUENZA VAC SPLIT QUAD 0.5 ML IM SUSY
0.5000 mL | PREFILLED_SYRINGE | Freq: Once | INTRAMUSCULAR | Status: AC
  Administered 2015-10-28: 0.5 mL via INTRAMUSCULAR

## 2015-10-28 NOTE — Patient Instructions (Signed)
[facility] at [facility] Discharge Instructions  RECOMMENDATIONS MADE BY THE CONSULTANT AND ANY TEST RESULTS WILL BE SENT TO YOUR REFERRING PHYSICIAN.  Vitamin B12 1000 mcg injection given as ordered. Influenza vaccine given as requested. Return as scheduled.  Thank you for choosing [facility] at [facility] to provide your oncology and hematology care.  To afford each patient quality time with our provider, please arrive at least 15 minutes before your scheduled appointment time.   Beginning January 23rd 2017 lab work for the Cancer Center will be done in the  Main lab at [facility] on 1st floor. If you have a lab appointment with the Cancer Center please come in thru the  Main Entrance and check in at the main information desk  You need to re-schedule your appointment should you arrive 10 or more minutes late.  We strive to give you quality time with our providers, and arriving late affects you and other patients whose appointments are after yours.  Also, if you no show three or more times for appointments you may be dismissed from the clinic at the providers discretion.     Again, thank you for choosing [facility].  Our hope is that these requests will decrease the amount of time that you wait before being seen by our physicians.       _____________________________________________________________  Should you have questions after your visit to [facility], please contact our office at [phone] between the hours of 8:30 a.m. and 4:30 p.m.  Voicemails left after 4:30 p.m. will not be returned until the following business day.  For prescription refill requests, have your pharmacy contact our office.         Resources For Cancer Patients and their Caregivers ? American Cancer Society: Can assist with transportation, wigs, general needs, runs Look Good Feel Better.        1-888-227-6333 ? Cancer  Care: Provides financial assistance, online support groups, medication/co-pay assistance.  1-800-813-HOPE (4673) ? [facility] Assists [place] cancer patients and their families through emotional , educational and financial support.  336-427-4357 ? [place] DSS Where to apply for food stamps, Medicaid and utility assistance. 336-342-1394 ? RCATS: Transportation to medical appointments. 336-347-2287 ? Social Security Administration: May apply for disability if have a Stage IV cancer. 336-342-7796 1-800-772-1213 ? [place] Aging, Disability and Transit Services: Assists with nutrition, care and transit needs. 336-349-2343  Cancer Center Support Programs: @10RELATIVEDAYS@ > Cancer Support Group  2nd Tuesday of the month 1pm-2pm, Journey Room  > Creative Journey  3rd Tuesday of the month 1130am-1pm, Journey Room  > Look Good Feel Better  1st Wednesday of the month 10am-12 noon, Journey Room (Call [employer] to register [phone])   

## 2015-10-28 NOTE — Progress Notes (Signed)
John Singleton presents today for injection per MD orders. B12 1000 mcg administered IM in right Upper Arm. Administration without incident. Patient tolerated well.  John Singleton presents today for injection per MD orders. Fluarix administered IM in left Upper Arm. Administration without incident. Patient tolerated well.

## 2015-11-04 ENCOUNTER — Encounter (HOSPITAL_BASED_OUTPATIENT_CLINIC_OR_DEPARTMENT_OTHER): Payer: Medicare Other

## 2015-11-04 ENCOUNTER — Encounter (HOSPITAL_COMMUNITY): Payer: Self-pay

## 2015-11-04 ENCOUNTER — Ambulatory Visit (HOSPITAL_COMMUNITY): Payer: Medicare Other

## 2015-11-04 VITALS — BP 125/94 | HR 73 | Temp 98.2°F | Resp 16

## 2015-11-04 DIAGNOSIS — E538 Deficiency of other specified B group vitamins: Secondary | ICD-10-CM | POA: Diagnosis not present

## 2015-11-04 MED ORDER — CYANOCOBALAMIN 1000 MCG/ML IJ SOLN
INTRAMUSCULAR | Status: AC
  Filled 2015-11-04: qty 1

## 2015-11-04 MED ORDER — CYANOCOBALAMIN 1000 MCG/ML IJ SOLN
1000.0000 ug | Freq: Once | INTRAMUSCULAR | Status: AC
  Administered 2015-11-04: 1000 ug via INTRAMUSCULAR

## 2015-11-04 NOTE — Patient Instructions (Signed)
[place] at [facility] Discharge Instructions  RECOMMENDATIONS MADE BY THE CONSULTANT AND ANY TEST RESULTS WILL BE SENT TO YOUR REFERRING PHYSICIAN.  You were given your B12 injection today. Return as scheduled.   Thank you for choosing [place] at [facility] to provide your oncology and hematology care.  To afford each patient quality time with our provider, please arrive at least 15 minutes before your scheduled appointment time.   Beginning January 23rd 2017 lab work for the [facility] will be done in the  Main lab at [facility] on 1st floor. If you have a lab appointment with the [facility] please come in thru the  Main Entrance and check in at the main information desk  You need to re-schedule your appointment should you arrive 10 or more minutes late.  We strive to give you quality time with our providers, and arriving late affects you and other patients whose appointments are after yours.  Also, if you no show three or more times for appointments you may be dismissed from the clinic at the providers discretion.     Again, thank you for choosing [facility].  Our hope is that these requests will decrease the amount of time that you wait before being seen by our physicians.       _____________________________________________________________  Should you have questions after your visit to [facility], please contact our office at [phone] between the hours of 8:30 a.m. and 4:30 p.m.  Voicemails left after 4:30 p.m. will not be returned until the following business day.  For prescription refill requests, have your pharmacy contact our office.         Resources For Cancer Patients and their Caregivers ? American Cancer Society: Can assist with transportation, wigs, general needs, runs Look Good Feel Better.        5853825391 ? Cancer Care: Provides financial assistance, online support  groups, medication/co-pay assistance.  1-800-813-HOPE 702-361-6660) ? [facility] Assists [place] cancer patients and their families through emotional , educational and financial support.  918-102-7066 ? [place] DSS Where to apply for food stamps, Medicaid and utility assistance. 430-795-6399 ? RCATS: Transportation to medical appointments. 236-189-3102 ? Social Security Administration: May apply for disability if have a Stage IV cancer. 4406753907 [phone] ? [employer], Disability and Transit Services: Assists with nutrition, care and transit needs. [place] Support Programs: @10RELATIVEDAYS @ > Cancer Support Group  2nd Tuesday of the month 1pm-2pm, Journey Room  > Creative Journey  3rd Tuesday of the month 1130am-1pm, Journey Room  > Look Good Feel Better  1st Wednesday of the month 10am-12 noon, Journey Room (Call [place] to register [phone])

## 2015-11-04 NOTE — Progress Notes (Signed)
Pt here today for B12 injection. Pt given injection in right deltoid. Pt tolerated well. Pt stable and discharged home ambulatory. Return in 1 week for next B12 injection

## 2015-11-08 ENCOUNTER — Other Ambulatory Visit (HOSPITAL_COMMUNITY): Payer: Self-pay | Admitting: Emergency Medicine

## 2015-11-08 MED ORDER — LENALIDOMIDE 15 MG PO CAPS: 15.0000 mg | ORAL_CAPSULE | Freq: Every day | ORAL | 1 refills | Status: DC

## 2015-11-08 NOTE — Progress Notes (Signed)
revlimid refilled

## 2015-11-11 ENCOUNTER — Encounter (HOSPITAL_BASED_OUTPATIENT_CLINIC_OR_DEPARTMENT_OTHER): Payer: Medicare Other

## 2015-11-11 VITALS — BP 116/69 | HR 82 | Temp 97.7°F | Resp 18

## 2015-11-11 DIAGNOSIS — E538 Deficiency of other specified B group vitamins: Secondary | ICD-10-CM | POA: Diagnosis not present

## 2015-11-11 MED ORDER — CYANOCOBALAMIN 1000 MCG/ML IJ SOLN
1000.0000 ug | Freq: Once | INTRAMUSCULAR | Status: AC
  Administered 2015-11-11: 1000 ug via INTRAMUSCULAR

## 2015-11-11 MED ORDER — CYANOCOBALAMIN 1000 MCG/ML IJ SOLN
INTRAMUSCULAR | Status: AC
  Filled 2015-11-11: qty 1

## 2015-11-11 NOTE — Progress Notes (Signed)
John Singleton presents today for injection per MD orders. B12 1000mcg administered IM in right Upper Arm. Administration without incident. Patient tolerated well.  

## 2015-11-11 NOTE — Patient Instructions (Signed)
[facility] at [facility] Discharge Instructions  RECOMMENDATIONS MADE BY THE CONSULTANT AND ANY TEST RESULTS WILL BE SENT TO YOUR REFERRING PHYSICIAN.  B12 today.    Thank you for choosing [facility] at [facility] to provide your oncology and hematology care.  To afford each patient quality time with our provider, please arrive at least 15 minutes before your scheduled appointment time.   Beginning January 23rd 2017 lab work for the Cancer Center will be done in the  Main lab at [place] on 1st floor. If you have a lab appointment with the Cancer Center please come in thru the  Main Entrance and check in at the main information desk  You need to re-schedule your appointment should you arrive 10 or more minutes late.  We strive to give you quality time with our providers, and arriving late affects you and other patients whose appointments are after yours.  Also, if you no show three or more times for appointments you may be dismissed from the clinic at the providers discretion.     Again, thank you for choosing [facility].  Our hope is that these requests will decrease the amount of time that you wait before being seen by our physicians.       _____________________________________________________________  Should you have questions after your visit to [facility], please contact our office at [phone] between the hours of 8:30 a.m. and 4:30 p.m.  Voicemails left after 4:30 p.m. will not be returned until the following business day.  For prescription refill requests, have your pharmacy contact our office.         Resources For Cancer Patients and their Caregivers ? American Cancer Society: Can assist with transportation, wigs, general needs, runs Look Good Feel Better.        1-888-227-6333 ? Cancer Care: Provides financial assistance, online support groups, medication/co-pay assistance.  1-800-813-HOPE  (4673) ? [facility] Assists [place] cancer patients and their families through emotional , educational and financial support.  336-427-4357 ? [place] DSS Where to apply for food stamps, Medicaid and utility assistance. 336-342-1394 ? RCATS: Transportation to medical appointments. 336-347-2287 ? Social Security Administration: May apply for disability if have a Stage IV cancer. 336-342-7796 1-800-772-1213 ? [place] Aging, Disability and Transit Services: Assists with nutrition, care and transit needs. 336-349-2343  Cancer Center Support Programs: @10RELATIVEDAYS@ > Cancer Support Group  2nd Tuesday of the month 1pm-2pm, Journey Room  > Creative Journey  3rd Tuesday of the month 1130am-1pm, Journey Room  > Look Good Feel Better  1st Wednesday of the month 10am-12 noon, Journey Room (Call American Cancer Society to register 1-800-395-5775)    

## 2015-11-16 ENCOUNTER — Encounter (HOSPITAL_COMMUNITY): Payer: Medicare Other

## 2015-11-16 ENCOUNTER — Encounter (HOSPITAL_BASED_OUTPATIENT_CLINIC_OR_DEPARTMENT_OTHER): Payer: Medicare Other | Admitting: Hematology & Oncology

## 2015-11-16 ENCOUNTER — Encounter (HOSPITAL_COMMUNITY): Payer: Self-pay | Admitting: Hematology & Oncology

## 2015-11-16 VITALS — BP 113/73 | HR 84 | Temp 98.1°F | Resp 16 | Wt 223.7 lb

## 2015-11-16 DIAGNOSIS — D696 Thrombocytopenia, unspecified: Secondary | ICD-10-CM

## 2015-11-16 DIAGNOSIS — T451X5A Adverse effect of antineoplastic and immunosuppressive drugs, initial encounter: Secondary | ICD-10-CM

## 2015-11-16 DIAGNOSIS — D6481 Anemia due to antineoplastic chemotherapy: Secondary | ICD-10-CM | POA: Diagnosis not present

## 2015-11-16 DIAGNOSIS — E538 Deficiency of other specified B group vitamins: Secondary | ICD-10-CM

## 2015-11-16 DIAGNOSIS — C9001 Multiple myeloma in remission: Secondary | ICD-10-CM | POA: Diagnosis not present

## 2015-11-16 MED ORDER — CYANOCOBALAMIN 1000 MCG/ML IJ SOLN
INTRAMUSCULAR | Status: AC
  Filled 2015-11-16: qty 1

## 2015-11-16 MED ORDER — CYANOCOBALAMIN 1000 MCG/ML IJ SOLN
1000.0000 ug | Freq: Once | INTRAMUSCULAR | Status: AC
  Administered 2015-11-16: 1000 ug via INTRAMUSCULAR

## 2015-11-16 NOTE — Progress Notes (Signed)
John Singleton presents today for injection per MD orders. B12 1000mcg administered IM in right Upper Arm. Administration without incident. Patient tolerated well.  

## 2015-11-16 NOTE — Progress Notes (Signed)
[place], MD [address] [place] [address]  B12 deficiency - Plan: cyanocobalamin ((VITAMIN B-12)) injection 1,000 mcg  Multiple myeloma in remission ([place]) - Plan: Protein electrophoresis, serum, Immunofixation electrophoresis, Kappa/lambda light chains, CBC with Differential, Comprehensive metabolic panel, CBC with Differential, Comprehensive metabolic panel, Protein electrophoresis, serum, Kappa/lambda light chains, Immunofixation electrophoresis  CURRENT THERAPY: Maintenance Revlimid, 15 mg daily.  INTERVAL HISTORY: John Singleton 55 y.o. male returns for followup of IgG Kappa Multiple myeloma, Stage III, having obtained a VGPR after 4 cycles of VRD. Now s/p autologous PBSCT with Day 0 being on 08/07/2012. Post transplant myeloma markers and BMBX demonstrated a stringent CR at 100 days. Continues care with his primary oncologist. Has completed 2 years of Zometa and will not require unless relapses and is receiving active therapy. Continue same maintenance Revlimid with dose of 15 mg daily. Can consider locally transition to dosing schedule of 21 days of every 28 day cycle.   John Singleton returns to the [facility] today unaccompanied. He takes Revlimid daily, continuously. I personally reviewed and went over bone density scan results.   He talks about taking Revlimid 21 days with 7 days off in the past but was advised to take Revlimid continuously by his physician at [facility].  He walks daily. He is unable to pick up a lot of heavy stuff. He previously worked as a Aeronautical engineer.   He has days he feels a little tired but overall he feels good. He eats well and sleeps well. He feels much better than prior to his transplant.   He received a flu shot with his PCP in September of this year. He sees Dr. Legrand Rams for primary care.  He denies chest pain - unless he is out doing a lot of stuff. He denies tingling in his hands or feet. Overall no major complaints or concerns.      Review of Systems  Constitutional: Negative for chills, fever and weight loss.  HENT: Negative.   Eyes: Negative.   Respiratory: Negative.  Negative for cough.   Cardiovascular: Positive for chest pain (with exertion).  Gastrointestinal: Negative.  Negative for blood in stool, constipation, diarrhea, melena, nausea and vomiting.  Genitourinary: Negative.  Negative for hematuria.  Musculoskeletal: Negative.   Skin: Negative.   Neurological: Negative.  Negative for weakness and headaches.  Endo/Heme/Allergies: Negative.  Does not bruise/bleed easily.  Psychiatric/Behavioral: Negative.   14 point review of systems was performed and is negative except as detailed under history of present illness and above   Past Medical History:  Diagnosis Date  . B12 deficiency 10/23/2015  . Cancer ([place])   . Diabetes mellitus without complication ([place])   . HTN (hypertension)   . Multiple myeloma ([place])   . Multiple myeloma in remission ([place]) 07/30/2015  . Pneumonia   . Sepsis(995.91)   . Stem cells transplant status [facility])     Past Surgical History:  Procedure Laterality Date  . NOSE SURGERY     "when I was a child"    History reviewed. No pertinent family history.  Social History   Social History  . Marital status: Legally Separated    Spouse name: N/A  . Number of children: N/A  . Years of education: N/A   Social History Main Topics  . Smoking status: Never Smoker  . Smokeless tobacco: Never Used  . Alcohol use No     Comment: occasional  . Drug use: No  . Sexual  activity: Yes    Birth control/ protection: None   Other Topics Concern  . None   Social History Narrative  . None     PHYSICAL EXAMINATION  ECOG PERFORMANCE STATUS: 0 - Asymptomatic  Vitals:   11/16/15 0915  BP: 113/73  Pulse: 84  Resp: 16  Temp: 98.1 F (36.7 C)   Physical Exam  Constitutional: He is oriented to person, place, and time and well-developed, well-nourished, and in no distress.  HENT:   Head: Normocephalic and atraumatic.  Mouth/Throat: Oropharynx is clear and moist.  Eyes: Conjunctivae and EOM are normal. Pupils are equal, round, and reactive to light.  Neck: Normal range of motion. Neck supple.  Cardiovascular: Normal rate, regular rhythm and normal heart sounds.   Pulmonary/Chest: Effort normal and breath sounds normal.  Abdominal: Soft. Bowel sounds are normal.  Musculoskeletal: Normal range of motion.  Neurological: He is alert and oriented to person, place, and time. Gait normal.  Skin: Skin is warm and dry.  Nursing note and vitals reviewed.   LABORATORY DATA: I have reviewed the data as listed. CBC    Component Value Date/Time   WBC 2.0 (L) 09/23/2015 0801   RBC 3.32 (L) 09/23/2015 0801   HGB 11.7 (L) 09/23/2015 0801   HCT 32.8 (L) 09/23/2015 0801   PLT 67 (L) 09/23/2015 0801   MCV 98.8 09/23/2015 0801   MCH 35.2 (H) 09/23/2015 0801   MCHC 35.7 09/23/2015 0801   RDW 15.1 09/23/2015 0801   LYMPHSABS 0.7 09/23/2015 0801   MONOABS 0.3 09/23/2015 0801   EOSABS 0.2 09/23/2015 0801   BASOSABS 0.1 09/23/2015 0801      Chemistry      Component Value Date/Time   NA 132 (L) 09/23/2015 0801   K 4.0 09/23/2015 0801   CL 103 09/23/2015 0801   CO2 22 09/23/2015 0801   BUN 18 09/23/2015 0801   CREATININE 1.14 09/23/2015 0801      Component Value Date/Time   CALCIUM 8.7 (L) 09/23/2015 0801   ALKPHOS 68 09/23/2015 0801   AST 32 09/23/2015 0801   ALT 48 09/23/2015 0801   BILITOT 0.7 09/23/2015 0801      Lab Results  Component Value Date   PROT 7.0 09/23/2015   ALBUMINELP 3.8 09/23/2015   A1GS 0.2 09/23/2015   A2GS 0.7 09/23/2015   BETS 1.0 09/23/2015   GAMS 0.9 09/23/2015   MSPIKE Not Observed 09/23/2015   SPEI Comment 09/23/2015   SPECOM Comment 09/23/2015   IGGSERUM 967 09/23/2015   IGGSERUM 972 09/23/2015   IGA 226 09/23/2015   IGA 221 09/23/2015   IGMSERUM 17 (L) 09/23/2015   IGMSERUM 17 (L) 09/23/2015   KPAFRELGTCHN 31.0 (H)  09/23/2015   LAMBDASER 24.1 09/23/2015   KAPLAMBRATIO 1.29 09/23/2015     RADIOGRAPHIC STUDIES: I have personally reviewed the radiological images as listed and agreed with the findings in the report.  No results found.  ASSESSMENT AND PLAN:  IgG kappa myeloma, remission Autologous transplant 08/07/2012 Revlimid maintenance Osteopenia B12 deficiency  10/17/2015 Bone density scan reviewed. Results noted above. Osteopenia noted. Encouraged the patient to regularly take calcium and Vitamin D supplements. Zometa has been discontinued.   He takes Revlimid daily, continuously - per [facility]  instructions. We will monitor his blood counts and advise if his Revlimid dosing needs to change.   He follows with Dr. Legrand Rams for his primary care needs. He has already received a flu vaccine this year.   He does not need any  refills at this time.   He is scheduled to return this Friday for a B12 injection. 10/21/15 labs show Vitamin B12 was low at 167. He will continue on monthly B12.   He will return for labs only at the end of November and will return for follow up in January with repeat labs.   ORDERS PLACED FOR THIS ENCOUNTER: Orders Placed This Encounter  Procedures  . Protein electrophoresis, serum  . Immunofixation electrophoresis  . Kappa/lambda light chains  . CBC with Differential  . Comprehensive metabolic panel  . CBC with Differential  . Comprehensive metabolic panel  . Protein electrophoresis, serum  . Kappa/lambda light chains  . Immunofixation electrophoresis    MEDICATIONS PRESCRIBED THIS ENCOUNTER: Meds ordered this encounter  Medications  . cyanocobalamin ((VITAMIN B-12)) injection 1,000 mcg    THERAPY PLAN:  Continue with maintenance Revlimid daily.  All questions were answered. The patient knows to call the clinic with any problems, questions or concerns. We can certainly see the patient much sooner if necessary.  This document serves as a record of services  personally performed by Ancil Linsey, MD. It was created on her behalf by Arlyce Harman, a trained medical scribe. The creation of this record is based on the scribe's personal observations and the provider's statements to them. This document has been checked and approved by the attending provider.  I have reviewed the above documentation for accuracy and completeness and I agree with the above.  This note is electronically signed by: Molli Hazard, MD 11/16/2015 3:17 PM

## 2015-11-16 NOTE — Patient Instructions (Addendum)
[place] at [facility] Discharge Instructions  RECOMMENDATIONS MADE BY THE CONSULTANT AND ANY TEST RESULTS WILL BE SENT TO YOUR REFERRING PHYSICIAN.  You saw Dr. Whitney Muse today. You will have B12 today instead of Friday. Lab work only in November. Follow up with MD and labs in January. B12 monthly- your B12 is very low at 167   Thank you for choosing [place] at [facility] to provide your oncology and hematology care.  To afford each patient quality time with our provider, please arrive at least 15 minutes before your scheduled appointment time.   Beginning January 23rd 2017 lab work for the [facility] will be done in the  Main lab at [facility] on 1st floor. If you have a lab appointment with the [facility] please come in thru the  Main Entrance and check in at the main information desk  You need to re-schedule your appointment should you arrive 10 or more minutes late.  We strive to give you quality time with our providers, and arriving late affects you and other patients whose appointments are after yours.  Also, if you no show three or more times for appointments you may be dismissed from the clinic at the providers discretion.     Again, thank you for choosing [facility].  Our hope is that these requests will decrease the amount of time that you wait before being seen by our physicians.       _____________________________________________________________  Should you have questions after your visit to [facility], please contact our office at [phone] between the hours of 8:30 a.m. and 4:30 p.m.  Voicemails left after 4:30 p.m. will not be returned until the following business day.  For prescription refill requests, have your pharmacy contact our office.         Resources For Cancer Patients and their Caregivers ? American Cancer Society: Can assist with transportation, wigs, general needs,  runs Look Good Feel Better.        ([phone] ? Cancer Care: Provides financial assistance, online support groups, medication/co-pay assistance.  1-800-813-HOPE 303-352-5522) ? [facility] Assists [place] cancer patients and their families through emotional , educational and financial support.  505-010-5110 ? [place] DSS Where to apply for food stamps, Medicaid and utility assistance. 586-844-5987 ? RCATS: Transportation to medical appointments. (650) 350-1790 ? Social Security Administration: May apply for disability if have a Stage IV cancer. 360-012-9740 (417)468-0117 ? [employer], Disability and Transit Services: Assists with nutrition, care and transit needs. [place] Support Programs: @10RELATIVEDAYS @ > Cancer Support Group  2nd Tuesday of the month 1pm-2pm, Journey Room  > Creative Journey  3rd Tuesday of the month 1130am-1pm, Journey Room  > Look Good Feel Better  1st Wednesday of the month 10am-12 noon, Journey Room (Call [facility] to register [phone])

## 2015-11-18 ENCOUNTER — Ambulatory Visit (HOSPITAL_COMMUNITY): Payer: Medicare Other

## 2015-12-07 ENCOUNTER — Other Ambulatory Visit (HOSPITAL_COMMUNITY): Payer: Self-pay | Admitting: Oncology

## 2015-12-07 MED ORDER — LENALIDOMIDE 15 MG PO CAPS: 15.0000 mg | ORAL_CAPSULE | Freq: Every day | ORAL | 1 refills | Status: DC

## 2015-12-19 ENCOUNTER — Encounter (HOSPITAL_COMMUNITY): Payer: Medicare Other | Attending: Oncology

## 2015-12-19 ENCOUNTER — Encounter (HOSPITAL_COMMUNITY): Payer: Medicare Other

## 2015-12-19 ENCOUNTER — Ambulatory Visit (HOSPITAL_COMMUNITY): Payer: Medicare Other

## 2015-12-19 ENCOUNTER — Other Ambulatory Visit (HOSPITAL_COMMUNITY): Payer: Self-pay | Admitting: Oncology

## 2015-12-19 VITALS — BP 124/76 | HR 86 | Temp 97.9°F | Resp 18

## 2015-12-19 DIAGNOSIS — E538 Deficiency of other specified B group vitamins: Secondary | ICD-10-CM | POA: Insufficient documentation

## 2015-12-19 DIAGNOSIS — C9001 Multiple myeloma in remission: Secondary | ICD-10-CM

## 2015-12-19 LAB — COMPREHENSIVE METABOLIC PANEL
ALK PHOS: 75 U/L (ref 38–126)
ALT: 61 U/L (ref 17–63)
ANION GAP: 7 (ref 5–15)
AST: 41 U/L (ref 15–41)
Albumin: 4 g/dL (ref 3.5–5.0)
BUN: 15 mg/dL (ref 6–20)
CALCIUM: 9.1 mg/dL (ref 8.9–10.3)
CO2: 21 mmol/L — ABNORMAL LOW (ref 22–32)
CREATININE: 1.1 mg/dL (ref 0.61–1.24)
Chloride: 103 mmol/L (ref 101–111)
Glucose, Bld: 228 mg/dL — ABNORMAL HIGH (ref 65–99)
Potassium: 4.2 mmol/L (ref 3.5–5.1)
Sodium: 131 mmol/L — ABNORMAL LOW (ref 135–145)
TOTAL PROTEIN: 7.3 g/dL (ref 6.5–8.1)
Total Bilirubin: 0.6 mg/dL (ref 0.3–1.2)

## 2015-12-19 LAB — CBC WITH DIFFERENTIAL/PLATELET
BASOS PCT: 3 %
Basophils Absolute: 0.1 10*3/uL (ref 0.0–0.1)
EOS PCT: 10 %
Eosinophils Absolute: 0.2 10*3/uL (ref 0.0–0.7)
HCT: 35.8 % — ABNORMAL LOW (ref 39.0–52.0)
HEMOGLOBIN: 12.6 g/dL — AB (ref 13.0–17.0)
LYMPHS PCT: 37 %
Lymphs Abs: 0.8 10*3/uL (ref 0.7–4.0)
MCH: 36.4 pg — AB (ref 26.0–34.0)
MCHC: 35.2 g/dL (ref 30.0–36.0)
MCV: 103.5 fL — AB (ref 78.0–100.0)
MONO ABS: 0.2 10*3/uL (ref 0.1–1.0)
Monocytes Relative: 11 %
NEUTROS ABS: 0.9 10*3/uL — AB (ref 1.7–7.7)
NEUTROS PCT: 39 %
Platelets: 68 10*3/uL — ABNORMAL LOW (ref 150–400)
RBC: 3.46 MIL/uL — ABNORMAL LOW (ref 4.22–5.81)
RDW: 15.1 % (ref 11.5–15.5)
WBC: 2.2 10*3/uL — ABNORMAL LOW (ref 4.0–10.5)

## 2015-12-19 MED ORDER — CYANOCOBALAMIN 1000 MCG/ML IJ SOLN
1000.0000 ug | Freq: Once | INTRAMUSCULAR | Status: AC
Start: 2015-12-19 — End: 2015-12-19
  Administered 2015-12-19: 1000 ug via INTRAMUSCULAR

## 2015-12-19 MED ORDER — CYANOCOBALAMIN 1000 MCG/ML IJ SOLN
INTRAMUSCULAR | Status: AC
  Filled 2015-12-19: qty 1

## 2015-12-19 NOTE — Progress Notes (Signed)
John Singleton tolerated Vit B12 injection well without complaints or incident. VSS Pt discharged self ambulatory in satisfactory condition 

## 2015-12-19 NOTE — Patient Instructions (Signed)
[facility] at [facility] Discharge Instructions  RECOMMENDATIONS MADE BY THE CONSULTANT AND ANY TEST RESULTS WILL BE SENT TO YOUR REFERRING PHYSICIAN.  Received Vit B12 injection today. Follow-up as scheduled. Call clinic for any questions or concerns  Thank you for choosing [facility] at [facility] to provide your oncology and hematology care.  To afford each patient quality time with our provider, please arrive at least 15 minutes before your scheduled appointment time.   Beginning January 23rd 2017 lab work for the Cancer Center will be done in the  Main lab at [place] on 1st floor. If you have a lab appointment with the Cancer Center please come in thru the  Main Entrance and check in at the main information desk  You need to re-schedule your appointment should you arrive 10 or more minutes late.  We strive to give you quality time with our providers, and arriving late affects you and other patients whose appointments are after yours.  Also, if you no show three or more times for appointments you may be dismissed from the clinic at the providers discretion.     Again, thank you for choosing [facility].  Our hope is that these requests will decrease the amount of time that you wait before being seen by our physicians.       _____________________________________________________________  Should you have questions after your visit to [facility], please contact our office at [phone] between the hours of 8:30 a.m. and 4:30 p.m.  Voicemails left after 4:30 p.m. will not be returned until the following business day.  For prescription refill requests, have your pharmacy contact our office.         Resources For Cancer Patients and their Caregivers ? American Cancer Society: Can assist with transportation, wigs, general needs, runs Look Good Feel Better.        1-888-227-6333 ? Cancer Care: Provides  financial assistance, online support groups, medication/co-pay assistance.  1-800-813-HOPE (4673) ? [facility] Assists [place] cancer patients and their families through emotional , educational and financial support.  336-427-4357 ? [place] DSS Where to apply for food stamps, Medicaid and utility assistance. 336-342-1394 ? RCATS: Transportation to medical appointments. 336-347-2287 ? Social Security Administration: May apply for disability if have a Stage IV cancer. 336-342-7796 1-800-772-1213 ? [place] Aging, Disability and Transit Services: Assists with nutrition, care and transit needs. 336-349-2343  Cancer Center Support Programs: @10RELATIVEDAYS@ > Cancer Support Group  2nd Tuesday of the month 1pm-2pm, Journey Room  > Creative Journey  3rd Tuesday of the month 1130am-1pm, Journey Room  > Look Good Feel Better  1st Wednesday of the month 10am-12 noon, Journey Room (Call American Cancer Society to register 1-800-395-5775)   

## 2015-12-20 LAB — PROTEIN ELECTROPHORESIS, SERUM
A/G RATIO SPE: 1.2 (ref 0.7–1.7)
ALPHA-2-GLOBULIN: 0.7 g/dL (ref 0.4–1.0)
Albumin ELP: 3.7 g/dL (ref 2.9–4.4)
Alpha-1-Globulin: 0.2 g/dL (ref 0.0–0.4)
Beta Globulin: 1.1 g/dL (ref 0.7–1.3)
GLOBULIN, TOTAL: 3.1 g/dL (ref 2.2–3.9)
Gamma Globulin: 1 g/dL (ref 0.4–1.8)
TOTAL PROTEIN ELP: 6.8 g/dL (ref 6.0–8.5)

## 2015-12-20 LAB — KAPPA/LAMBDA LIGHT CHAINS
KAPPA FREE LGHT CHN: 33.5 mg/L — AB (ref 3.3–19.4)
Kappa, lambda light chain ratio: 1.28 (ref 0.26–1.65)
Lambda free light chains: 26.2 mg/L (ref 5.7–26.3)

## 2015-12-21 LAB — IMMUNOFIXATION ELECTROPHORESIS
IGA: 249 mg/dL (ref 90–386)
IgG (Immunoglobin G), Serum: 1000 mg/dL (ref 700–1600)
IgM, Serum: 20 mg/dL (ref 20–172)
Total Protein ELP: 7 g/dL (ref 6.0–8.5)

## 2016-01-03 ENCOUNTER — Other Ambulatory Visit (HOSPITAL_COMMUNITY): Payer: Self-pay | Admitting: Oncology

## 2016-01-03 DIAGNOSIS — C9001 Multiple myeloma in remission: Secondary | ICD-10-CM

## 2016-01-03 MED ORDER — LENALIDOMIDE 15 MG PO CAPS: 15.0000 mg | ORAL_CAPSULE | Freq: Every day | ORAL | 1 refills | Status: DC

## 2016-01-18 ENCOUNTER — Encounter (HOSPITAL_COMMUNITY): Payer: Medicare Other | Attending: Hematology & Oncology

## 2016-01-18 ENCOUNTER — Encounter (HOSPITAL_COMMUNITY): Payer: Self-pay

## 2016-01-18 VITALS — BP 141/81 | HR 99 | Resp 18

## 2016-01-18 DIAGNOSIS — C9001 Multiple myeloma in remission: Secondary | ICD-10-CM | POA: Insufficient documentation

## 2016-01-18 DIAGNOSIS — E538 Deficiency of other specified B group vitamins: Secondary | ICD-10-CM

## 2016-01-18 MED ORDER — CYANOCOBALAMIN 1000 MCG/ML IJ SOLN
1000.0000 ug | Freq: Once | INTRAMUSCULAR | Status: AC
  Administered 2016-01-18: 1000 ug via INTRAMUSCULAR

## 2016-01-18 MED ORDER — CYANOCOBALAMIN 1000 MCG/ML IJ SOLN
INTRAMUSCULAR | Status: AC
  Filled 2016-01-18: qty 1

## 2016-01-18 NOTE — Progress Notes (Signed)
John Singleton presents today for injection per MD orders. B12 1,000 administered IM in right Upper Arm. Administration without incident. Patient tolerated well.  Vitals stable and discharged ambulatory from clinic. Follow up as scheduled.

## 2016-01-18 NOTE — Patient Instructions (Signed)
[facility] at [facility] Discharge Instructions  RECOMMENDATIONS MADE BY THE CONSULTANT AND ANY TEST RESULTS WILL BE SENT TO YOUR REFERRING PHYSICIAN.  B12 injection today. Follow up as scheduled.  Thank you for choosing [facility] at [facility] to provide your oncology and hematology care.  To afford each patient quality time with our provider, please arrive at least 15 minutes before your scheduled appointment time.   Beginning January 23rd 2017 lab work for the Cancer Center will be done in the  Main lab at [place] on 1st floor. If you have a lab appointment with the Cancer Center please come in thru the  Main Entrance and check in at the main information desk  You need to re-schedule your appointment should you arrive 10 or more minutes late.  We strive to give you quality time with our providers, and arriving late affects you and other patients whose appointments are after yours.  Also, if you no show three or more times for appointments you may be dismissed from the clinic at the providers discretion.     Again, thank you for choosing [facility].  Our hope is that these requests will decrease the amount of time that you wait before being seen by our physicians.       _____________________________________________________________  Should you have questions after your visit to [facility], please contact our office at [phone] between the hours of 8:30 a.m. and 4:30 p.m.  Voicemails left after 4:30 p.m. will not be returned until the following business day.  For prescription refill requests, have your pharmacy contact our office.         Resources For Cancer Patients and their Caregivers ? American Cancer Society: Can assist with transportation, wigs, general needs, runs Look Good Feel Better.        1-888-227-6333 ? Cancer Care: Provides financial assistance, online support groups, medication/co-pay  assistance.  1-800-813-HOPE (4673) ? [facility] Assists [place] cancer patients and their families through emotional , educational and financial support.  336-427-4357 ? [place] DSS Where to apply for food stamps, Medicaid and utility assistance. 336-342-1394 ? RCATS: Transportation to medical appointments. 336-347-2287 ? Social Security Administration: May apply for disability if have a Stage IV cancer. 336-342-7796 1-800-772-1213 ? [place] Aging, Disability and Transit Services: Assists with nutrition, care and transit needs. 336-349-2343  Cancer Center Support Programs: @10RELATIVEDAYS@ > Cancer Support Group  2nd Tuesday of the month 1pm-2pm, Journey Room  > Creative Journey  3rd Tuesday of the month 1130am-1pm, Journey Room  > Look Good Feel Better  1st Wednesday of the month 10am-12 noon, Journey Room (Call American Cancer Society to register 1-800-395-5775)   

## 2016-01-25 DIAGNOSIS — Z6832 Body mass index (BMI) 32.0-32.9, adult: Secondary | ICD-10-CM | POA: Diagnosis not present

## 2016-01-25 DIAGNOSIS — C9001 Multiple myeloma in remission: Secondary | ICD-10-CM | POA: Diagnosis not present

## 2016-01-25 DIAGNOSIS — E119 Type 2 diabetes mellitus without complications: Secondary | ICD-10-CM | POA: Diagnosis not present

## 2016-01-25 DIAGNOSIS — Z9484 Stem cells transplant status: Secondary | ICD-10-CM | POA: Diagnosis not present

## 2016-01-30 ENCOUNTER — Other Ambulatory Visit (HOSPITAL_COMMUNITY): Payer: Self-pay | Admitting: Oncology

## 2016-01-30 DIAGNOSIS — C9001 Multiple myeloma in remission: Secondary | ICD-10-CM

## 2016-01-30 MED ORDER — LENALIDOMIDE 15 MG PO CAPS: 15.0000 mg | ORAL_CAPSULE | Freq: Every day | ORAL | 0 refills | Status: DC

## 2016-02-20 ENCOUNTER — Encounter (HOSPITAL_COMMUNITY): Payer: Medicare Other

## 2016-02-20 ENCOUNTER — Encounter (HOSPITAL_COMMUNITY): Payer: Medicare Other | Attending: Oncology | Admitting: Oncology

## 2016-02-20 ENCOUNTER — Encounter (HOSPITAL_BASED_OUTPATIENT_CLINIC_OR_DEPARTMENT_OTHER): Payer: Medicare Other

## 2016-02-20 ENCOUNTER — Encounter (HOSPITAL_COMMUNITY): Payer: Self-pay

## 2016-02-20 VITALS — BP 127/81 | HR 80 | Temp 98.1°F | Resp 18 | Wt 221.5 lb

## 2016-02-20 DIAGNOSIS — E538 Deficiency of other specified B group vitamins: Secondary | ICD-10-CM | POA: Diagnosis not present

## 2016-02-20 DIAGNOSIS — C9001 Multiple myeloma in remission: Secondary | ICD-10-CM | POA: Insufficient documentation

## 2016-02-20 DIAGNOSIS — M8580 Other specified disorders of bone density and structure, unspecified site: Secondary | ICD-10-CM

## 2016-02-20 LAB — COMPREHENSIVE METABOLIC PANEL
ALK PHOS: 62 U/L (ref 38–126)
ALT: 41 U/L (ref 17–63)
ANION GAP: 9 (ref 5–15)
AST: 31 U/L (ref 15–41)
Albumin: 4 g/dL (ref 3.5–5.0)
BILIRUBIN TOTAL: 0.9 mg/dL (ref 0.3–1.2)
BUN: 15 mg/dL (ref 6–20)
CALCIUM: 8.6 mg/dL — AB (ref 8.9–10.3)
CO2: 23 mmol/L (ref 22–32)
Chloride: 101 mmol/L (ref 101–111)
Creatinine, Ser: 1.21 mg/dL (ref 0.61–1.24)
GFR calc Af Amer: >60 mL/min (ref >60)
GFR calc non Af Amer: >60 mL/min (ref >60)
Glucose, Bld: 185 mg/dL — ABNORMAL HIGH (ref 65–99)
POTASSIUM: 3.6 mmol/L (ref 3.5–5.1)
Sodium: 133 mmol/L — ABNORMAL LOW (ref 135–145)
TOTAL PROTEIN: 7.1 g/dL (ref 6.5–8.1)

## 2016-02-20 LAB — CBC WITH DIFFERENTIAL/PLATELET
BASOS PCT: 2 %
Basophils Absolute: 0.1 10*3/uL (ref 0.0–0.1)
EOS PCT: 9 %
Eosinophils Absolute: 0.2 10*3/uL (ref 0.0–0.7)
HEMATOCRIT: 36.2 % — AB (ref 39.0–52.0)
Hemoglobin: 12.9 g/dL — ABNORMAL LOW (ref 13.0–17.0)
LYMPHS ABS: 1 10*3/uL (ref 0.7–4.0)
Lymphocytes Relative: 39 %
MCH: 35.3 pg — ABNORMAL HIGH (ref 26.0–34.0)
MCHC: 35.6 g/dL (ref 30.0–36.0)
MCV: 99.2 fL (ref 78.0–100.0)
MONO ABS: 0.3 10*3/uL (ref 0.1–1.0)
Monocytes Relative: 11 %
NEUTROS ABS: 0.9 10*3/uL — AB (ref 1.7–7.7)
Neutrophils Relative %: 39 %
Platelets: 70 10*3/uL — ABNORMAL LOW (ref 150–400)
RBC: 3.65 MIL/uL — ABNORMAL LOW (ref 4.22–5.81)
RDW: 14.2 % (ref 11.5–15.5)
WBC: 2.5 10*3/uL — ABNORMAL LOW (ref 4.0–10.5)

## 2016-02-20 MED ORDER — CYANOCOBALAMIN 1000 MCG/ML IJ SOLN
INTRAMUSCULAR | Status: AC
  Filled 2016-02-20: qty 1

## 2016-02-20 MED ORDER — CYANOCOBALAMIN 1000 MCG/ML IJ SOLN
1000.0000 ug | Freq: Once | INTRAMUSCULAR | Status: AC
  Administered 2016-02-20: 1000 ug via INTRAMUSCULAR

## 2016-02-20 NOTE — Progress Notes (Signed)
[name],[name], MD [address] [place] [address]  Multiple myeloma in remission [facility])  CURRENT THERAPY: Maintenance Revlimid, 15 mg daily.  INTERVAL HISTORY: John Singleton 56 y.o. male returns for followup of IgG Kappa Multiple myeloma, Stage III, having obtained a VGPR after 4 cycles of VRD. Now s/p autologous PBSCT with Day 0 being on 08/07/2012. Post transplant myeloma markers and BMBX demonstrated a stringent CR at 100 days. Continues care with his primary oncologist. Has completed 2 years of Zometa and will not require unless relapses and is receiving active therapy. Continue same maintenance Revlimid with dose of 15 mg daily. Can consider locally transition to dosing schedule of 21 days of every 28 day cycle.   Patient presents today for follow up. He continues to take Revlimid 15 mg PO daily without any side effects. Overall he feels well and has no complaints today. Most recent lab work from today demonstrates he has a stable thrombocytopenia with platelets 70 K.    Review of Systems  Constitutional: Negative for chills, fever and weight loss.  HENT: Negative.   Eyes: Negative.   Respiratory: Negative.  Negative for cough.   Gastrointestinal: Negative.  Negative for blood in stool, constipation, diarrhea, melena, nausea and vomiting.  Genitourinary: Negative.  Negative for hematuria.  Musculoskeletal: Negative.   Skin: Negative.   Neurological: Negative.  Negative for weakness and headaches.  Endo/Heme/Allergies: Negative.  Does not bruise/bleed easily.  Psychiatric/Behavioral: Negative.   14 point review of systems was performed and is negative except as detailed under history of present illness and above   Past Medical History:  Diagnosis Date  . B12 deficiency 10/23/2015  . Cancer ([place])   . Diabetes mellitus without complication ([place])   . HTN (hypertension)   . Multiple myeloma ([place])   . Multiple myeloma in remission ([place]) 07/30/2015  .  Pneumonia   . Sepsis(995.91)   . Stem cells transplant status [facility])     Past Surgical History:  Procedure Laterality Date  . NOSE SURGERY     "when I was a child"    History reviewed. No pertinent family history.  Social History   Social History  . Marital status: Legally Separated    Spouse name: N/A  . Number of children: N/A  . Years of education: N/A   Social History Main Topics  . Smoking status: Never Smoker  . Smokeless tobacco: Never Used  . Alcohol use No     Comment: occasional  . Drug use: No  . Sexual activity: Yes    Birth control/ protection: None   Other Topics Concern  . None   Social History Narrative  . None     PHYSICAL EXAMINATION  ECOG PERFORMANCE STATUS: 0 - Asymptomatic  Vitals:   02/20/16 1049  BP: 127/81  Pulse: 80  Resp: 18  Temp: 98.1 F (36.7 C)   Physical Exam  Constitutional: He is oriented to person, place, and time and well-developed, well-nourished, and in no distress.  HENT:  Head: Normocephalic and atraumatic.  Mouth/Throat: Oropharynx is clear and moist.  Eyes: Conjunctivae and EOM are normal. Pupils are equal, round, and reactive to light.  Neck: Normal range of motion. Neck supple.  Cardiovascular: Normal rate, regular rhythm and normal heart sounds.   Pulmonary/Chest: Effort normal and breath sounds normal.  Abdominal: Soft. Bowel sounds are normal.  Musculoskeletal: Normal range of motion.  Neurological: He is alert and oriented to person, place, and time.  Gait normal.  Skin: Skin is warm and dry.  Nursing note and vitals reviewed.   LABORATORY DATA: I have reviewed the data as listed. CBC    Component Value Date/Time   WBC 2.5 (L) 02/20/2016 1015   RBC 3.65 (L) 02/20/2016 1015   HGB 12.9 (L) 02/20/2016 1015   HCT 36.2 (L) 02/20/2016 1015   PLT 70 (L) 02/20/2016 1015   MCV 99.2 02/20/2016 1015   MCH 35.3 (H) 02/20/2016 1015   MCHC 35.6 02/20/2016 1015   RDW 14.2 02/20/2016 1015   LYMPHSABS PENDING  02/20/2016 1015   MONOABS PENDING 02/20/2016 1015   EOSABS PENDING 02/20/2016 1015   BASOSABS PENDING 02/20/2016 1015      Chemistry      Component Value Date/Time   NA 131 (L) 12/19/2015 0855   K 4.2 12/19/2015 0855   CL 103 12/19/2015 0855   CO2 21 (L) 12/19/2015 0855   BUN 15 12/19/2015 0855   CREATININE 1.10 12/19/2015 0855      Component Value Date/Time   CALCIUM 9.1 12/19/2015 0855   ALKPHOS 75 12/19/2015 0855   AST 41 12/19/2015 0855   ALT 61 12/19/2015 0855   BILITOT 0.6 12/19/2015 0855      Lab Results  Component Value Date   PROT 7.3 12/19/2015   ALBUMINELP 3.7 12/19/2015   A1GS 0.2 12/19/2015   A2GS 0.7 12/19/2015   BETS 1.1 12/19/2015   GAMS 1.0 12/19/2015   MSPIKE Not Observed 12/19/2015   SPEI Comment 12/19/2015   SPECOM Comment 12/19/2015   IGGSERUM 1,000 12/19/2015   IGA 249 12/19/2015   IGMSERUM 20 12/19/2015   KPAFRELGTCHN 33.5 (H) 12/19/2015   LAMBDASER 26.2 12/19/2015   KAPLAMBRATIO 1.28 12/19/2015     RADIOGRAPHIC STUDIES: I have personally reviewed the radiological images as listed and agreed with the findings in the report.  Bone density 10/17/2015 ASSESSMENT: This patient is considered osteopenic according to [facility]) guidelines. (Lumbar spine was not utilized due to advanced degenerative Changes.) RECOMMENDATIONS: National Osteoporosis Foundation recommends that FDA-approved medial therapies be considered in postmenopausal women and men age 12 or older with a: 1. Hip or vertberal (clinical or morphometric) fracture. 2. T-Score of < -2.5 at the spine or hip. 3. Ten-year fracture probability by FRAX of 3% or greater for hip fracture or 20% or greater for major osteoporotic fracture.  No results found.  ASSESSMENT AND PLAN:  IgG kappa myeloma, remission Autologous transplant 08/07/2012 Revlimid maintenance Osteopenia B12 deficiency  10/17/2015 Bone density scan reviewed. Results noted above. Osteopenia  noted. Encouraged the patient to regularly take calcium and Vitamin D supplements. Zometa has been discontinued.   He takes Revlimid daily, continuously - per [facility]  instructions. We will monitor his blood counts and advise if his Revlimid dosing needs to change.   He follows with Dr. Legrand Rams for his primary care needs. He has already received a flu vaccine this year.   He does not need any refills at this time.   He will continue on monthly B12, an injection will be given today.   Follow up in 3 months with CBC, CMP, free kappa lamda light chains and beta microglobulin.   Continue follow up with [facility] transplant physician yearly as scheduled.    ORDERS PLACED FOR THIS ENCOUNTER: No orders of the defined types were placed in this encounter.   MEDICATIONS PRESCRIBED THIS ENCOUNTER: No orders of the defined types were placed in this encounter.   THERAPY PLAN:  Continue with maintenance Revlimid daily.  All questions were answered. The patient knows to call the clinic with any problems, questions or concerns. We can certainly see the patient much sooner if necessary.  This document serves as a record of services personally performed by Twana First, MD. It was created on her behalf by Arlyce Harman, a trained medical scribe. The creation of this record is based on the scribe's personal observations and the provider's statements to them. This document has been checked and approved by the attending provider.  I have reviewed the above documentation for accuracy and completeness and I agree with the above. Twana First, MD  This note is electronically signed by: Carlis Abbott 02/20/2016 10:53 AM

## 2016-02-20 NOTE — Patient Instructions (Signed)
[facility] at [facility] Discharge Instructions  RECOMMENDATIONS MADE BY THE CONSULTANT AND ANY TEST RESULTS WILL BE SENT TO YOUR REFERRING PHYSICIAN.  Received Vit B12 injection today. Follow-up as scheduled. Call clinic for any questions or concerns  Thank you for choosing [facility] at [facility] to provide your oncology and hematology care.  To afford each patient quality time with our provider, please arrive at least 15 minutes before your scheduled appointment time.    If you have a lab appointment with the Cancer Center please come in thru the  Main Entrance and check in at the main information desk  You need to re-schedule your appointment should you arrive 10 or more minutes late.  We strive to give you quality time with our providers, and arriving late affects you and other patients whose appointments are after yours.  Also, if you no show three or more times for appointments you may be dismissed from the clinic at the providers discretion.     Again, thank you for choosing [facility].  Our hope is that these requests will decrease the amount of time that you wait before being seen by our physicians.       _____________________________________________________________  Should you have questions after your visit to [facility], please contact our office at [phone] between the hours of 8:30 a.m. and 4:30 p.m.  Voicemails left after 4:30 p.m. will not be returned until the following business day.  For prescription refill requests, have your pharmacy contact our office.       Resources For Cancer Patients and their Caregivers ? American Cancer Society: Can assist with transportation, wigs, general needs, runs Look Good Feel Better.        1-888-227-6333 ? Cancer Care: Provides financial assistance, online support groups, medication/co-pay assistance.  1-800-813-HOPE (4673) ? [facility] Assists [place] cancer patients and their families through emotional , educational and financial support.  336-427-4357 ? [place] DSS Where to apply for food stamps, Medicaid and utility assistance. 336-342-1394 ? RCATS: Transportation to medical appointments. 336-347-2287 ? Social Security Administration: May apply for disability if have a Stage IV cancer. 336-342-7796 1-800-772-1213 ? [place] Aging, Disability and Transit Services: Assists with nutrition, care and transit needs. 336-349-2343  Cancer Center Support Programs: @10RELATIVEDAYS@ > Cancer Support Group  2nd Tuesday of the month 1pm-2pm, Journey Room  > Creative Journey  3rd Tuesday of the month 1130am-1pm, Journey Room  > Look Good Feel Better  1st Wednesday of the month 10am-12 noon, Journey Room (Call American Cancer [employer] to register 1-800-395-5775)   

## 2016-02-20 NOTE — Patient Instructions (Signed)
[place] at [facility] Discharge Instructions  RECOMMENDATIONS MADE BY THE CONSULTANT AND ANY TEST RESULTS WILL BE SENT TO YOUR REFERRING PHYSICIAN.  You were seen today by Dr. Talbert Cage Follow up with lab work and appointment in 3 months  Thank you for choosing [place] at [facility] to provide your oncology and hematology care.  To afford each patient quality time with our provider, please arrive at least 15 minutes before your scheduled appointment time.    If you have a lab appointment with the [place] please come in thru the  Main Entrance and check in at the main information desk  You need to re-schedule your appointment should you arrive 10 or more minutes late.  We strive to give you quality time with our providers, and arriving late affects you and other patients whose appointments are after yours.  Also, if you no show three or more times for appointments you may be dismissed from the clinic at the providers discretion.     Again, thank you for choosing [facility].  Our hope is that these requests will decrease the amount of time that you wait before being seen by our physicians.       _____________________________________________________________  Should you have questions after your visit to [facility], please contact our office at [phone] between the hours of 8:30 a.m. and 4:30 p.m.  Voicemails left after 4:30 p.m. will not be returned until the following business day.  For prescription refill requests, have your pharmacy contact our office.       Resources For Cancer Patients and their Caregivers ? American Cancer Society: Can assist with transportation, wigs, general needs, runs Look Good Feel Better.        272-753-8607 ? Cancer Care: Provides financial assistance, online support groups, medication/co-pay assistance.  1-800-813-HOPE 681-022-4455) ? [place] Assists [place] cancer patients and their families through emotional , educational and financial support.  6367547788 ? [place] DSS Where to apply for food stamps, Medicaid and utility assistance. 9710340093 ? RCATS: Transportation to medical appointments. 973-797-5271 ? Social Security Administration: May apply for disability if have a Stage IV cancer. 718-790-0278 (786) 162-9544 ? [employer], Disability and Transit Services: Assists with nutrition, care and transit needs. [place] Support Programs: @10RELATIVEDAYS @ > Cancer Support Group  2nd Tuesday of the month 1pm-2pm, Journey Room  > Creative Journey  3rd Tuesday of the month 1130am-1pm, Journey Room  > Look Good Feel Better  1st Wednesday of the month 10am-12 noon, Journey Room (Call [place] to register [phone])

## 2016-02-20 NOTE — Progress Notes (Signed)
John Singleton tolerated Vit B12 injection well without complaints or incident. Pt discharged self ambulatory in satisfactory condition

## 2016-02-21 LAB — PROTEIN ELECTROPHORESIS, SERUM
A/G Ratio: 1.2 (ref 0.7–1.7)
ALPHA-1-GLOBULIN: 0.2 g/dL (ref 0.0–0.4)
ALPHA-2-GLOBULIN: 0.8 g/dL (ref 0.4–1.0)
Albumin ELP: 3.7 g/dL (ref 2.9–4.4)
Beta Globulin: 1 g/dL (ref 0.7–1.3)
GAMMA GLOBULIN: 1.1 g/dL (ref 0.4–1.8)
Globulin, Total: 3.1 g/dL (ref 2.2–3.9)
Total Protein ELP: 6.8 g/dL (ref 6.0–8.5)

## 2016-02-21 LAB — KAPPA/LAMBDA LIGHT CHAINS
Kappa free light chain: 29.4 mg/L — ABNORMAL HIGH (ref 3.3–19.4)
Kappa, lambda light chain ratio: 1.25 (ref 0.26–1.65)
Lambda free light chains: 23.6 mg/L (ref 5.7–26.3)

## 2016-02-24 LAB — IMMUNOFIXATION ELECTROPHORESIS
IgA: 231 mg/dL (ref 90–386)
IgG (Immunoglobin G), Serum: 1022 mg/dL (ref 700–1600)
IgM, Serum: 19 mg/dL — ABNORMAL LOW (ref 20–172)
Total Protein ELP: 6.6 g/dL (ref 6.0–8.5)

## 2016-02-29 ENCOUNTER — Other Ambulatory Visit (HOSPITAL_COMMUNITY): Payer: Self-pay | Admitting: Oncology

## 2016-02-29 DIAGNOSIS — C9001 Multiple myeloma in remission: Secondary | ICD-10-CM

## 2016-02-29 MED ORDER — LENALIDOMIDE 15 MG PO CAPS: 15.0000 mg | ORAL_CAPSULE | Freq: Every day | ORAL | 0 refills | Status: DC

## 2016-03-10 DIAGNOSIS — Z79899 Other long term (current) drug therapy: Secondary | ICD-10-CM | POA: Diagnosis not present

## 2016-03-10 DIAGNOSIS — R05 Cough: Secondary | ICD-10-CM | POA: Diagnosis not present

## 2016-03-10 DIAGNOSIS — I10 Essential (primary) hypertension: Secondary | ICD-10-CM | POA: Diagnosis not present

## 2016-03-10 DIAGNOSIS — C9000 Multiple myeloma not having achieved remission: Secondary | ICD-10-CM | POA: Diagnosis not present

## 2016-03-10 DIAGNOSIS — Z7982 Long term (current) use of aspirin: Secondary | ICD-10-CM | POA: Diagnosis not present

## 2016-03-10 DIAGNOSIS — J40 Bronchitis, not specified as acute or chronic: Secondary | ICD-10-CM | POA: Diagnosis not present

## 2016-03-10 DIAGNOSIS — E119 Type 2 diabetes mellitus without complications: Secondary | ICD-10-CM | POA: Diagnosis not present

## 2016-03-10 DIAGNOSIS — J209 Acute bronchitis, unspecified: Secondary | ICD-10-CM | POA: Diagnosis not present

## 2016-03-22 ENCOUNTER — Encounter (HOSPITAL_COMMUNITY): Payer: Self-pay

## 2016-03-22 ENCOUNTER — Encounter (HOSPITAL_COMMUNITY): Payer: Medicare Other | Attending: Hematology & Oncology

## 2016-03-22 VITALS — BP 117/73 | HR 81 | Temp 98.0°F | Resp 18

## 2016-03-22 DIAGNOSIS — C9001 Multiple myeloma in remission: Secondary | ICD-10-CM | POA: Insufficient documentation

## 2016-03-22 DIAGNOSIS — E538 Deficiency of other specified B group vitamins: Secondary | ICD-10-CM | POA: Diagnosis present

## 2016-03-22 MED ORDER — CYANOCOBALAMIN 1000 MCG/ML IJ SOLN
1000.0000 ug | Freq: Once | INTRAMUSCULAR | Status: AC
  Administered 2016-03-22: 1000 ug via INTRAMUSCULAR

## 2016-03-22 MED ORDER — CYANOCOBALAMIN 1000 MCG/ML IJ SOLN
INTRAMUSCULAR | Status: AC
  Filled 2016-03-22: qty 1

## 2016-03-22 NOTE — Progress Notes (Signed)
John Singleton presents today for injection per MD orders. B12 1000mcg administered IM in right Upper Arm. Administration without incident. Patient tolerated well.  

## 2016-03-22 NOTE — Patient Instructions (Signed)
[place] at [facility] Discharge Instructions  RECOMMENDATIONS MADE BY THE CONSULTANT AND ANY TEST RESULTS WILL BE SENT TO YOUR REFERRING PHYSICIAN.  B12 injection.    Thank you for choosing [place] at [facility] to provide your oncology and hematology care.  To afford each patient quality time with our provider, please arrive at least 15 minutes before your scheduled appointment time.    If you have a lab appointment with the [facility] please come in thru the  Main Entrance and check in at the main information desk  You need to re-schedule your appointment should you arrive 10 or more minutes late.  We strive to give you quality time with our providers, and arriving late affects you and other patients whose appointments are after yours.  Also, if you no show three or more times for appointments you may be dismissed from the clinic at the providers discretion.     Again, thank you for choosing [facility].  Our hope is that these requests will decrease the amount of time that you wait before being seen by our physicians.       _____________________________________________________________  Should you have questions after your visit to [facility], please contact our office at [phone] between the hours of 8:30 a.m. and 4:30 p.m.  Voicemails left after 4:30 p.m. will not be returned until the following business day.  For prescription refill requests, have your pharmacy contact our office.       Resources For Cancer Patients and their Caregivers ? American Cancer Society: Can assist with transportation, wigs, general needs, runs Look Good Feel Better.        [phone] ? Cancer Care: Provides financial assistance, online support groups, medication/co-pay assistance.  1-800-813-HOPE 905-206-6373) ? [place] Assists [place] cancer patients and their families through emotional  , educational and financial support.  425-466-0488 ? [place] DSS Where to apply for food stamps, Medicaid and utility assistance. (407)073-2356 ? RCATS: Transportation to medical appointments. 709-045-9690 ? Social Security Administration: May apply for disability if have a Stage IV cancer. 737 817 0421 785-286-7555 ? [employer], Disability and Transit Services: Assists with nutrition, care and transit needs. [place] Support Programs: @10RELATIVEDAYS @ > Cancer Support Group  2nd Tuesday of the month 1pm-2pm, Journey Room  > Creative Journey  3rd Tuesday of the month 1130am-1pm, Journey Room  > Look Good Feel Better  1st Wednesday of the month 10am-12 noon, Journey Room (Call [place] to register [phone])

## 2016-03-26 ENCOUNTER — Other Ambulatory Visit (HOSPITAL_COMMUNITY): Payer: Self-pay | Admitting: Oncology

## 2016-03-26 DIAGNOSIS — C9001 Multiple myeloma in remission: Secondary | ICD-10-CM

## 2016-03-26 MED ORDER — LENALIDOMIDE 15 MG PO CAPS: 15.0000 mg | ORAL_CAPSULE | Freq: Every day | ORAL | 0 refills | Status: DC

## 2016-04-19 ENCOUNTER — Encounter (HOSPITAL_BASED_OUTPATIENT_CLINIC_OR_DEPARTMENT_OTHER): Payer: Medicare Other

## 2016-04-19 VITALS — BP 109/75 | HR 87 | Temp 97.8°F | Resp 20

## 2016-04-19 DIAGNOSIS — E538 Deficiency of other specified B group vitamins: Secondary | ICD-10-CM

## 2016-04-19 MED ORDER — CYANOCOBALAMIN 1000 MCG/ML IJ SOLN
1000.0000 ug | Freq: Once | INTRAMUSCULAR | Status: AC
  Administered 2016-04-19: 1000 ug via INTRAMUSCULAR
  Filled 2016-04-19: qty 1

## 2016-04-19 NOTE — Progress Notes (Signed)
Patient is taking Revlimid every day and has reported no side effects.   John Singleton presents today for injection per MD orders. B12 1073mcg administered IM in right Upper Arm. Administration without incident. Patient tolerated well.

## 2016-04-23 ENCOUNTER — Other Ambulatory Visit (HOSPITAL_COMMUNITY): Payer: Self-pay | Admitting: Oncology

## 2016-04-23 DIAGNOSIS — C9001 Multiple myeloma in remission: Secondary | ICD-10-CM

## 2016-04-23 MED ORDER — LENALIDOMIDE 15 MG PO CAPS: 15.0000 mg | ORAL_CAPSULE | Freq: Every day | ORAL | 0 refills | Status: DC

## 2016-04-24 DIAGNOSIS — E119 Type 2 diabetes mellitus without complications: Secondary | ICD-10-CM | POA: Diagnosis not present

## 2016-04-24 DIAGNOSIS — I10 Essential (primary) hypertension: Secondary | ICD-10-CM | POA: Diagnosis not present

## 2016-04-24 DIAGNOSIS — Z9484 Stem cells transplant status: Secondary | ICD-10-CM | POA: Diagnosis not present

## 2016-05-16 ENCOUNTER — Encounter (HOSPITAL_COMMUNITY): Payer: Medicare Other | Attending: Oncology

## 2016-05-16 DIAGNOSIS — Z9889 Other specified postprocedural states: Secondary | ICD-10-CM | POA: Insufficient documentation

## 2016-05-16 DIAGNOSIS — I10 Essential (primary) hypertension: Secondary | ICD-10-CM | POA: Diagnosis not present

## 2016-05-16 DIAGNOSIS — E538 Deficiency of other specified B group vitamins: Secondary | ICD-10-CM | POA: Diagnosis not present

## 2016-05-16 DIAGNOSIS — M8580 Other specified disorders of bone density and structure, unspecified site: Secondary | ICD-10-CM | POA: Diagnosis not present

## 2016-05-16 DIAGNOSIS — E119 Type 2 diabetes mellitus without complications: Secondary | ICD-10-CM | POA: Insufficient documentation

## 2016-05-16 DIAGNOSIS — C9001 Multiple myeloma in remission: Secondary | ICD-10-CM | POA: Diagnosis not present

## 2016-05-16 LAB — COMPREHENSIVE METABOLIC PANEL
ALBUMIN: 3.9 g/dL (ref 3.5–5.0)
ALK PHOS: 76 U/L (ref 38–126)
ALT: 50 U/L (ref 17–63)
ANION GAP: 7 (ref 5–15)
AST: 39 U/L (ref 15–41)
BUN: 15 mg/dL (ref 6–20)
CO2: 23 mmol/L (ref 22–32)
Calcium: 8.7 mg/dL — ABNORMAL LOW (ref 8.9–10.3)
Chloride: 104 mmol/L (ref 101–111)
Creatinine, Ser: 1.07 mg/dL (ref 0.61–1.24)
GFR calc Af Amer: >60 mL/min (ref >60)
GFR calc non Af Amer: >60 mL/min (ref >60)
GLUCOSE: 182 mg/dL — AB (ref 65–99)
POTASSIUM: 3.9 mmol/L (ref 3.5–5.1)
SODIUM: 134 mmol/L — AB (ref 135–145)
Total Bilirubin: 0.7 mg/dL (ref 0.3–1.2)
Total Protein: 7 g/dL (ref 6.5–8.1)

## 2016-05-16 LAB — CBC WITH DIFFERENTIAL/PLATELET
BASOS ABS: 0.1 10*3/uL (ref 0.0–0.1)
BASOS PCT: 3 %
Eosinophils Absolute: 0.4 10*3/uL (ref 0.0–0.7)
Eosinophils Relative: 16 %
HEMATOCRIT: 36.1 % — AB (ref 39.0–52.0)
HEMOGLOBIN: 12.7 g/dL — AB (ref 13.0–17.0)
Lymphocytes Relative: 34 %
Lymphs Abs: 0.9 10*3/uL (ref 0.7–4.0)
MCH: 35.6 pg — ABNORMAL HIGH (ref 26.0–34.0)
MCHC: 35.2 g/dL (ref 30.0–36.0)
MCV: 101.1 fL — ABNORMAL HIGH (ref 78.0–100.0)
Monocytes Absolute: 0.3 10*3/uL (ref 0.1–1.0)
Monocytes Relative: 12 %
NEUTROS ABS: 1 10*3/uL — AB (ref 1.7–7.7)
Neutrophils Relative %: 36 %
Platelets: 81 10*3/uL — ABNORMAL LOW (ref 150–400)
RBC: 3.57 MIL/uL — AB (ref 4.22–5.81)
RDW: 14.6 % (ref 11.5–15.5)
WBC: 2.6 10*3/uL — ABNORMAL LOW (ref 4.0–10.5)

## 2016-05-16 LAB — VITAMIN B12: VITAMIN B 12: 187 pg/mL (ref 180–914)

## 2016-05-17 LAB — KAPPA/LAMBDA LIGHT CHAINS
KAPPA FREE LGHT CHN: 35.4 mg/L — AB (ref 3.3–19.4)
KAPPA, LAMDA LIGHT CHAIN RATIO: 1.39 (ref 0.26–1.65)
Lambda free light chains: 25.5 mg/L (ref 5.7–26.3)

## 2016-05-17 LAB — BETA 2 MICROGLOBULIN, SERUM: BETA 2 MICROGLOBULIN: 1.8 mg/L (ref 0.6–2.4)

## 2016-05-21 ENCOUNTER — Encounter (HOSPITAL_BASED_OUTPATIENT_CLINIC_OR_DEPARTMENT_OTHER): Payer: Medicare Other | Admitting: Oncology

## 2016-05-21 ENCOUNTER — Encounter (HOSPITAL_COMMUNITY): Payer: Self-pay

## 2016-05-21 ENCOUNTER — Encounter (HOSPITAL_BASED_OUTPATIENT_CLINIC_OR_DEPARTMENT_OTHER): Payer: Medicare Other

## 2016-05-21 VITALS — BP 129/79 | HR 88 | Temp 97.9°F | Resp 18 | Wt 224.3 lb

## 2016-05-21 DIAGNOSIS — C9001 Multiple myeloma in remission: Secondary | ICD-10-CM

## 2016-05-21 DIAGNOSIS — E538 Deficiency of other specified B group vitamins: Secondary | ICD-10-CM

## 2016-05-21 MED ORDER — CYANOCOBALAMIN 1000 MCG/ML IJ SOLN
INTRAMUSCULAR | Status: AC
  Filled 2016-05-21: qty 1

## 2016-05-21 MED ORDER — CYANOCOBALAMIN 1000 MCG/ML IJ SOLN
1000.0000 ug | Freq: Once | INTRAMUSCULAR | Status: AC
  Administered 2016-05-21: 1000 ug via INTRAMUSCULAR

## 2016-05-21 NOTE — Progress Notes (Signed)
John Singleton, John Singleton [id number]  Progress Note  No diagnosis found.  CURRENT THERAPY: Maintenance Revlimid, 15 mg daily.  INTERVAL HISTORY:John Singleton 56 y.o. male returns for followup of IgG Kappa Multiple myeloma, Stage III, having obtained a VGPR after 4 cycles of VRD. Now s/p autologous PBSCT with Day 0 being on 08/07/2012. Post transplant myeloma markers and BMBX demonstrated a stringent CR at 100 days. Continues care with his primary oncologist. Has completed 2 years of Zometa and will not require unless relapses and is receiving active therapy. Continue same maintenance Revlimid with dose of 15 mg daily. Can consider locally transition to dosing schedule of 21 days of every 28 day cycle.   Patient presents today for follow up. I personally reviewed and went over labs with the patient.  He continues to take Revlimid 15 mg PO daily without any side effects. Overall he feels well and has no complaints today. Denies any new health issues since his last visit. Denies chest pain, sob, bone pain, abdominal pain, and weight loss. He notes his appetite is unchanged. Most recent lab work from today demonstrates he has a stable thrombocytopenia with platelets 81 K.      Review of Systems  Constitutional: Negative.  Negative for weight loss.       Appetite unchanged.  HENT: Negative.   Eyes: Negative.   Respiratory: Negative.  Negative for shortness of breath.   Cardiovascular: Negative.  Negative for chest pain.  Gastrointestinal: Negative.  Negative for abdominal pain.  Genitourinary: Negative.   Musculoskeletal: Negative.        Denies bone pain  Skin: Negative.   Neurological: Negative.   Endo/Heme/Allergies: Negative.   Psychiatric/Behavioral: Negative.   All other systems reviewed and are negative. 14 point review of systems was performed and is negative except as detailed under history of present illness and above   Past Medical  History:  Diagnosis Date  . B12 deficiency 10/23/2015  . Cancer ([place])   . Diabetes mellitus without complication ([place])   . HTN (hypertension)   . Multiple myeloma ([place])   . Multiple myeloma in remission ([place]) 07/30/2015  . Pneumonia   . Sepsis(995.91)   . Stem cells transplant status [facility])     Past Surgical History:  Procedure Laterality Date  . NOSE SURGERY     "when I was a child"    History reviewed. No pertinent family history.  Social History   Social History  . Marital status: Legally Separated    Spouse name: N/A  . Number of children: N/A  . Years of education: N/A   Social History Main Topics  . Smoking status: Never Smoker  . Smokeless tobacco: Never Used  . Alcohol use No     Comment: occasional  . Drug use: No  . Sexual activity: Yes    Birth control/ protection: None   Other Topics Concern  . None   Social History Narrative  . None     PHYSICAL EXAMINATION  ECOG PERFORMANCE STATUS: 0 - Asymptomatic  Vitals:   05/21/16 1354  BP: 129/79  Pulse: 88  Resp: 18  Temp: 97.9 F (36.6 C)   Filed Weights   05/21/16 1354  Weight: 224 lb 4.8 oz (101.7 kg)     Physical Exam  Constitutional: He is oriented to person, place, and time and well-developed, well-nourished, and in no distress.  HENT:  Head: Normocephalic and atraumatic.  Mouth/Throat: Oropharynx  is clear and moist.  Eyes: Conjunctivae and EOM are normal. Pupils are equal, round, and reactive to light.  Neck: Normal range of motion. Neck supple.  Cardiovascular: Normal rate, regular rhythm and normal heart sounds.   Pulmonary/Chest: Effort normal and breath sounds normal.  Abdominal: Soft. Bowel sounds are normal.  Musculoskeletal: Normal range of motion.  Neurological: He is alert and oriented to person, place, and time. Gait normal.  Skin: Skin is warm and dry.  Nursing note and vitals reviewed.   LABORATORY DATA: I have reviewed the data as listed. CBC    Component Value  Date/Time   WBC 2.6 (L) 05/16/2016 0909   RBC 3.57 (L) 05/16/2016 0909   HGB 12.7 (L) 05/16/2016 0909   HCT 36.1 (L) 05/16/2016 0909   PLT 81 (L) 05/16/2016 0909   MCV 101.1 (H) 05/16/2016 0909   MCH 35.6 (H) 05/16/2016 0909   MCHC 35.2 05/16/2016 0909   RDW 14.6 05/16/2016 0909   LYMPHSABS 0.9 05/16/2016 0909   MONOABS 0.3 05/16/2016 0909   EOSABS 0.4 05/16/2016 0909   BASOSABS 0.1 05/16/2016 0909      Chemistry      Component Value Date/Time   NA 134 (L) 05/16/2016 0909   K 3.9 05/16/2016 0909   CL 104 05/16/2016 0909   CO2 23 05/16/2016 0909   BUN 15 05/16/2016 0909   CREATININE 1.07 05/16/2016 0909      Component Value Date/Time   CALCIUM 8.7 (L) 05/16/2016 0909   ALKPHOS 76 05/16/2016 0909   AST 39 05/16/2016 0909   ALT 50 05/16/2016 0909   BILITOT 0.7 05/16/2016 0909      Lab Results  Component Value Date   PROT 7.0 05/16/2016   ALBUMINELP 3.7 02/20/2016   A1GS 0.2 02/20/2016   A2GS 0.8 02/20/2016   BETS 1.0 02/20/2016   GAMS 1.1 02/20/2016   MSPIKE Not Observed 02/20/2016   SPEI Comment 02/20/2016   SPECOM Comment 02/20/2016   IGGSERUM 1,022 02/20/2016   IGA 231 02/20/2016   IGMSERUM 19 (L) 02/20/2016   KPAFRELGTCHN 35.4 (H) 05/16/2016   LAMBDASER 25.5 05/16/2016   KAPLAMBRATIO 1.39 05/16/2016     RADIOGRAPHIC STUDIES: I have personally reviewed the radiological images as listed and agreed with the findings in the report.  Bone density 10/17/2015 ASSESSMENT: This patient is considered osteopenic according to [facility]) guidelines. (Lumbar spine was not utilized due to advanced degenerative Changes.) RECOMMENDATIONS: National Osteoporosis Foundation recommends that FDA-approved medial therapies be considered in postmenopausal women and men age 50 or older with a: 1. Hip or vertberal (clinical or morphometric) fracture. 2. T-Score of < -2.5 at the spine or hip. 3. Ten-year fracture probability by FRAX of 3% or greater for  hip fracture or 20% or greater for major osteoporotic fracture.  ASSESSMENT AND PLAN:  IgG kappa myeloma, remission Autologous transplant 08/07/2012 Revlimid maintenance Osteopenia B12 deficiency  10/17/2015 Bone density scan reviewed. Results noted above. Osteopenia noted. Encouraged the patient to regularly take calcium and Vitamin D supplements. Zometa has been discontinued.   Reviewed labs today. IFE remains negative for evidence of a monoclonal paraprotein. Continue maintenance revlimid at 76m PO daily. Monthly B12 injection today. Check b12 level on next visit. RTC in 4 months for follow up with labs.  Orders Placed This Encounter  Procedures  . CBC with Differential    Standing Status:   Future    Standing Expiration Date:   05/21/2017  . Comprehensive metabolic panel  Standing Status:   Future    Standing Expiration Date:   05/21/2017  . Lactate dehydrogenase    Standing Status:   Future    Standing Expiration Date:   05/21/2017  . Multiple Myeloma Panel (SPEP&IFE w/QIG)    Standing Status:   Future    Standing Expiration Date:   05/21/2017  . Vitamin B12    Standing Status:   Future    Standing Expiration Date:   05/21/2017     THERAPY PLAN:  Continue with maintenance Revlimid daily.  All questions were answered. The patient knows to call the clinic with any problems, questions or concerns. We can certainly see the patient much sooner if necessary.  This document serves as a record of services personally performed by Twana First, MD. It was created on her behalf by Shirlean Mylar, a trained medical scribe. The creation of this record is based on the scribe's personal observations and the provider's statements to them. This document has been checked and approved by the attending provider.  I have reviewed the above documentation for accuracy and completeness and I agree with the above. Twana First, MD  This note is electronically signed by: Mikey College 05/21/2016 2:00 PM

## 2016-05-21 NOTE — Progress Notes (Signed)
Patient is taking Revlimid.  He reports taking it daily as prescribed.  He denies any side effects stating " I have been taking it for years and never have any trouble".

## 2016-05-21 NOTE — Progress Notes (Signed)
John Singleton presents today for injection per MD orders. B12 1000 mcg  administered SQ in right deltoid. Administration without incident. Patient tolerated well. Patient discharged ambulatory from clinic.

## 2016-05-21 NOTE — Patient Instructions (Addendum)
[facility] at [facility] Discharge Instructions  RECOMMENDATIONS MADE BY THE CONSULTANT AND ANY TEST RESULTS WILL BE SENT TO YOUR REFERRING PHYSICIAN.  You were seen today by Dr. Louise Zhou  Follow up in 4 months with lab work  Thank you for choosing [facility] at [facility] to provide your oncology and hematology care.  To afford each patient quality time with our provider, please arrive at least 15 minutes before your scheduled appointment time.    If you have a lab appointment with the Cancer Center please come in thru the  Main Entrance and check in at the main information desk  You need to re-schedule your appointment should you arrive 10 or more minutes late.  We strive to give you quality time with our providers, and arriving late affects you and other patients whose appointments are after yours.  Also, if you no show three or more times for appointments you may be dismissed from the clinic at the providers discretion.     Again, thank you for choosing [facility].  Our hope is that these requests will decrease the amount of time that you wait before being seen by our physicians.       _____________________________________________________________  Should you have questions after your visit to [facility], please contact our office at [phone] between the hours of 8:30 a.m. and 4:30 p.m.  Voicemails left after 4:30 p.m. will not be returned until the following business day.  For prescription refill requests, have your pharmacy contact our office.       Resources For Cancer Patients and their Caregivers ? American Cancer Society: Can assist with transportation, wigs, general needs, runs Look Good Feel Better.        1-888-227-6333 ? Cancer Care: Provides financial assistance, online support groups, medication/co-pay assistance.  1-800-813-HOPE (4673) ? [facility] Assists  [place] cancer patients and their families through emotional , educational and financial support.  336-427-4357 ? [place] DSS Where to apply for food stamps, Medicaid and utility assistance. 336-342-1394 ? RCATS: Transportation to medical appointments. 336-347-2287 ? Social Security Administration: May apply for disability if have a Stage IV cancer. 336-342-7796 1-800-772-1213 ? [place] Aging, Disability and Transit Services: Assists with nutrition, care and transit needs. 336-349-2343  Cancer Center Support Programs: @10RELATIVEDAYS@ > Cancer Support Group  2nd Tuesday of the month 1pm-2pm, Journey Room  > Creative Journey  3rd Tuesday of the month 1130am-1pm, Journey Room  > Look Good Feel Better  1st Wednesday of the month 10am-12 noon, Journey Room (Call American Cancer Society to register 1-800-395-5775)    

## 2016-05-21 NOTE — Patient Instructions (Signed)
[facility] at [facility] Discharge Instructions  RECOMMENDATIONS MADE BY THE CONSULTANT AND ANY TEST RESULTS WILL BE SENT TO YOUR REFERRING PHYSICIAN.  You got your B-12 injection today Continue to get it monthly   Thank you for choosing  [facility] at [facility] to provide your oncology and hematology care.  To afford each patient quality time with our provider, please arrive at least 15 minutes before your scheduled appointment time.    If you have a lab appointment with the Cancer Center please come in thru the  Main Entrance and check in at the main information desk  You need to re-schedule your appointment should you arrive 10 or more minutes late.  We strive to give you quality time with our providers, and arriving late affects you and other patients whose appointments are after yours.  Also, if you no show three or more times for appointments you may be dismissed from the clinic at the providers discretion.     Again, thank you for choosing [facility].  Our hope is that these requests will decrease the amount of time that you wait before being seen by our physicians.       _____________________________________________________________  Should you have questions after your visit to [facility], please contact our office at [phone] between the hours of 8:30 a.m. and 4:30 p.m.  Voicemails left after 4:30 p.m. will not be returned until the following business day.  For prescription refill requests, have your pharmacy contact our office.       Resources For Cancer Patients and their Caregivers ? American Cancer Society: Can assist with transportation, wigs, general needs, runs Look Good Feel Better.        1-888-227-6333 ? Cancer Care: Provides financial assistance, online support groups, medication/co-pay assistance.  1-800-813-HOPE (4673) ? [facility] Assists [place]  cancer patients and their families through emotional , educational and financial support.  336-427-4357 ? [place] DSS Where to apply for food stamps, Medicaid and utility assistance. 336-342-1394 ? RCATS: Transportation to medical appointments. 336-347-2287 ? Social Security Administration: May apply for disability if have a Stage IV cancer. 336-342-7796 1-800-772-1213 ? [place] Aging, Disability and Transit Services: Assists with nutrition, care and transit needs. 336-349-2343  Cancer Center Support Programs: @10RELATIVEDAYS@ > Cancer Support Group  2nd Tuesday of the month 1pm-2pm, Journey Room  > Creative Journey  3rd Tuesday of the month 1130am-1pm, Journey Room  > Look Good Feel Better  1st Wednesday of the month 10am-12 noon, Journey Room (Call American Cancer Society to register 1-800-395-5775)    

## 2016-06-21 ENCOUNTER — Encounter (HOSPITAL_COMMUNITY): Payer: Medicare Other | Attending: Hematology & Oncology

## 2016-06-21 ENCOUNTER — Encounter (HOSPITAL_COMMUNITY): Payer: Self-pay

## 2016-06-21 VITALS — BP 129/77 | HR 83 | Temp 98.0°F | Resp 18

## 2016-06-21 DIAGNOSIS — C9001 Multiple myeloma in remission: Secondary | ICD-10-CM | POA: Insufficient documentation

## 2016-06-21 DIAGNOSIS — E538 Deficiency of other specified B group vitamins: Secondary | ICD-10-CM | POA: Diagnosis present

## 2016-06-21 MED ORDER — CYANOCOBALAMIN 1000 MCG/ML IJ SOLN
INTRAMUSCULAR | Status: AC
  Filled 2016-06-21: qty 1

## 2016-06-21 MED ORDER — CYANOCOBALAMIN 1000 MCG/ML IJ SOLN
1000.0000 ug | Freq: Once | INTRAMUSCULAR | Status: AC
  Administered 2016-06-21: 1000 ug via INTRAMUSCULAR

## 2016-06-21 NOTE — Progress Notes (Signed)
John Singleton presents today for injection per the provider's orders.  B12 administration without incident; see MAR for injection details.  Patient tolerated procedure well and without incident.  No questions or complaints noted at this time.  Discharged ambulatory.  

## 2016-06-21 NOTE — Patient Instructions (Signed)
[facility] at [facility] Discharge Instructions  RECOMMENDATIONS MADE BY THE CONSULTANT AND ANY TEST RESULTS WILL BE SENT TO YOUR REFERRING PHYSICIAN.  B12 injection today. Return as scheduled.   Thank you for choosing [facility] at [facility] to provide your oncology and hematology care.  To afford each patient quality time with our provider, please arrive at least 15 minutes before your scheduled appointment time.    If you have a lab appointment with the Cancer Center please come in thru the  Main Entrance and check in at the main information desk  You need to re-schedule your appointment should you arrive 10 or more minutes late.  We strive to give you quality time with our providers, and arriving late affects you and other patients whose appointments are after yours.  Also, if you no show three or more times for appointments you may be dismissed from the clinic at the providers discretion.     Again, thank you for choosing [facility].  Our hope is that these requests will decrease the amount of time that you wait before being seen by our physicians.       _____________________________________________________________  Should you have questions after your visit to [facility], please contact our office at [phone] between the hours of 8:30 a.m. and 4:30 p.m.  Voicemails left after 4:30 p.m. will not be returned until the following business day.  For prescription refill requests, have your pharmacy contact our office.       Resources For Cancer Patients and their Caregivers ? American Cancer Society: Can assist with transportation, wigs, general needs, runs Look Good Feel Better.        1-888-227-6333 ? Cancer Care: Provides financial assistance, online support groups, medication/co-pay assistance.  1-800-813-HOPE (4673) ? [facility] Assists [place] cancer patients and their  families through emotional , educational and financial support.  336-427-4357 ? [place] DSS Where to apply for food stamps, Medicaid and utility assistance. 336-342-1394 ? RCATS: Transportation to medical appointments. 336-347-2287 ? Social Security Administration: May apply for disability if have a Stage IV cancer. 336-342-7796 1-800-772-1213 ? [place] Aging, Disability and Transit Services: Assists with nutrition, care and transit needs. 336-349-2343  Cancer Center Support Programs: @10RELATIVEDAYS@ > Cancer Support Group  2nd Tuesday of the month 1pm-2pm, Journey Room  > Creative Journey  3rd Tuesday of the month 1130am-1pm, Journey Room  > Look Good Feel Better  1st Wednesday of the month 10am-12 noon, Journey Room (Call American Cancer Society to register 1-800-395-5775)   

## 2016-07-05 ENCOUNTER — Other Ambulatory Visit (HOSPITAL_COMMUNITY): Payer: Self-pay | Admitting: Oncology

## 2016-07-05 DIAGNOSIS — C9001 Multiple myeloma in remission: Secondary | ICD-10-CM

## 2016-07-05 MED ORDER — LENALIDOMIDE 15 MG PO CAPS: 15.0000 mg | ORAL_CAPSULE | Freq: Every day | ORAL | 0 refills | Status: DC

## 2016-07-20 ENCOUNTER — Encounter (HOSPITAL_COMMUNITY): Payer: Medicare Other | Attending: Hematology & Oncology

## 2016-07-20 VITALS — BP 121/76 | HR 87 | Temp 98.2°F | Resp 18

## 2016-07-20 DIAGNOSIS — E538 Deficiency of other specified B group vitamins: Secondary | ICD-10-CM

## 2016-07-20 DIAGNOSIS — C9001 Multiple myeloma in remission: Secondary | ICD-10-CM | POA: Insufficient documentation

## 2016-07-20 MED ORDER — CYANOCOBALAMIN 1000 MCG/ML IJ SOLN
INTRAMUSCULAR | Status: AC
  Filled 2016-07-20: qty 1

## 2016-07-20 MED ORDER — CYANOCOBALAMIN 1000 MCG/ML IJ SOLN
1000.0000 ug | Freq: Once | INTRAMUSCULAR | Status: AC
  Administered 2016-07-20: 1000 ug via INTRAMUSCULAR

## 2016-07-20 NOTE — Progress Notes (Signed)
John Singleton presents today for injection per MD orders. B12 1000 mcg administered IM in right Upper Arm. Administration without incident. Patient tolerated well. Stable and ambulatory on discharge home to self.

## 2016-07-20 NOTE — Patient Instructions (Signed)
[facility] at [facility] Discharge Instructions  RECOMMENDATIONS MADE BY THE CONSULTANT AND ANY TEST RESULTS WILL BE SENT TO YOUR REFERRING PHYSICIAN.  Vitamin B12 1000 mcg injection given as ordered. Return as scheduled.  Thank you for choosing [facility] at [facility] to provide your oncology and hematology care.  To afford each patient quality time with our provider, please arrive at least 15 minutes before your scheduled appointment time.    If you have a lab appointment with the Cancer Center please come in thru the  Main Entrance and check in at the main information desk  You need to re-schedule your appointment should you arrive 10 or more minutes late.  We strive to give you quality time with our providers, and arriving late affects you and other patients whose appointments are after yours.  Also, if you no show three or more times for appointments you may be dismissed from the clinic at the providers discretion.     Again, thank you for choosing [facility].  Our hope is that these requests will decrease the amount of time that you wait before being seen by our physicians.       _____________________________________________________________  Should you have questions after your visit to [facility], please contact our office at [phone] between the hours of 8:30 a.m. and 4:30 p.m.  Voicemails left after 4:30 p.m. will not be returned until the following business day.  For prescription refill requests, have your pharmacy contact our office.       Resources For Cancer Patients and their Caregivers ? American Cancer Society: Can assist with transportation, wigs, general needs, runs Look Good Feel Better.        1-888-227-6333 ? Cancer Care: Provides financial assistance, online support groups, medication/co-pay assistance.  1-800-813-HOPE (4673) ? [facility] Assists [place] cancer patients and their families through emotional , educational and financial support.  336-427-4357 ? [place] DSS Where to apply for food stamps, Medicaid and utility assistance. 336-342-1394 ? RCATS: Transportation to medical appointments. 336-347-2287 ? Social Security Administration: May apply for disability if have a Stage IV cancer. 336-342-7796 1-800-772-1213 ? [place] Aging, Disability and Transit Services: Assists with nutrition, care and transit needs. 336-349-2343  Cancer Center Support Programs: @10RELATIVEDAYS@ > Cancer Support Group  2nd Tuesday of the month 1pm-2pm, Journey Room  > Creative Journey  3rd Tuesday of the month 1130am-1pm, Journey Room  > Look Good Feel Better  1st Wednesday of the month 10am-12 noon, Journey Room (Call American Cancer Society to register 1-800-395-5775)   

## 2016-07-24 ENCOUNTER — Encounter (HOSPITAL_COMMUNITY): Payer: Self-pay | Admitting: Oncology

## 2016-08-02 DIAGNOSIS — I10 Essential (primary) hypertension: Secondary | ICD-10-CM | POA: Diagnosis not present

## 2016-08-02 DIAGNOSIS — E119 Type 2 diabetes mellitus without complications: Secondary | ICD-10-CM | POA: Diagnosis not present

## 2016-08-02 DIAGNOSIS — C9001 Multiple myeloma in remission: Secondary | ICD-10-CM | POA: Diagnosis not present

## 2016-08-02 DIAGNOSIS — Z9484 Stem cells transplant status: Secondary | ICD-10-CM | POA: Diagnosis not present

## 2016-08-10 DIAGNOSIS — Z9484 Stem cells transplant status: Secondary | ICD-10-CM | POA: Diagnosis not present

## 2016-08-10 DIAGNOSIS — C9001 Multiple myeloma in remission: Secondary | ICD-10-CM | POA: Diagnosis not present

## 2016-08-13 ENCOUNTER — Other Ambulatory Visit (HOSPITAL_COMMUNITY): Payer: Self-pay | Admitting: Oncology

## 2016-08-13 DIAGNOSIS — C9001 Multiple myeloma in remission: Secondary | ICD-10-CM

## 2016-08-13 MED ORDER — LENALIDOMIDE 15 MG PO CAPS: 15.0000 mg | ORAL_CAPSULE | Freq: Every day | ORAL | 4 refills | Status: DC

## 2016-08-20 ENCOUNTER — Encounter (HOSPITAL_COMMUNITY): Payer: Medicare Other | Attending: Hematology & Oncology

## 2016-08-20 VITALS — BP 134/73 | HR 111 | Temp 97.6°F | Resp 18

## 2016-08-20 DIAGNOSIS — C9001 Multiple myeloma in remission: Secondary | ICD-10-CM | POA: Insufficient documentation

## 2016-08-20 DIAGNOSIS — E538 Deficiency of other specified B group vitamins: Secondary | ICD-10-CM

## 2016-08-20 MED ORDER — CYANOCOBALAMIN 1000 MCG/ML IJ SOLN
INTRAMUSCULAR | Status: AC
  Filled 2016-08-20: qty 1

## 2016-08-20 MED ORDER — CYANOCOBALAMIN 1000 MCG/ML IJ SOLN
1000.0000 ug | Freq: Once | INTRAMUSCULAR | Status: AC
  Administered 2016-08-20: 1000 ug via INTRAMUSCULAR

## 2016-08-20 NOTE — Progress Notes (Signed)
John Singleton presents today for injection per MD orders. B12 1055mcg administered IM in right Upper Arm. Administration without incident. Patient tolerated well.

## 2016-09-12 ENCOUNTER — Other Ambulatory Visit (HOSPITAL_COMMUNITY): Payer: Self-pay

## 2016-09-12 DIAGNOSIS — C9001 Multiple myeloma in remission: Secondary | ICD-10-CM

## 2016-09-12 MED ORDER — LENALIDOMIDE 15 MG PO CAPS: 15.0000 mg | ORAL_CAPSULE | Freq: Every day | ORAL | 0 refills | Status: DC

## 2016-09-12 NOTE — Telephone Encounter (Signed)
Received refill request from patients speciality pharmacy for Revlimid. Reviewed with provider, chart checked and refilled.  

## 2016-09-20 ENCOUNTER — Ambulatory Visit (HOSPITAL_COMMUNITY): Payer: Medicare Other | Admitting: Adult Health

## 2016-09-20 ENCOUNTER — Encounter (HOSPITAL_COMMUNITY): Payer: Medicare Other | Attending: Oncology

## 2016-09-20 ENCOUNTER — Encounter (HOSPITAL_COMMUNITY): Payer: Self-pay

## 2016-09-20 ENCOUNTER — Encounter (HOSPITAL_BASED_OUTPATIENT_CLINIC_OR_DEPARTMENT_OTHER): Payer: Medicare Other

## 2016-09-20 VITALS — BP 126/82 | HR 86 | Temp 98.3°F | Resp 18

## 2016-09-20 DIAGNOSIS — E538 Deficiency of other specified B group vitamins: Secondary | ICD-10-CM | POA: Diagnosis not present

## 2016-09-20 DIAGNOSIS — C9001 Multiple myeloma in remission: Secondary | ICD-10-CM

## 2016-09-20 LAB — CBC WITH DIFFERENTIAL/PLATELET
BASOS PCT: 3 %
Basophils Absolute: 0.1 10*3/uL (ref 0.0–0.1)
EOS ABS: 0.3 10*3/uL (ref 0.0–0.7)
Eosinophils Relative: 9 %
HEMATOCRIT: 37.1 % — AB (ref 39.0–52.0)
HEMOGLOBIN: 13.1 g/dL (ref 13.0–17.0)
LYMPHS ABS: 1 10*3/uL (ref 0.7–4.0)
Lymphocytes Relative: 37 %
MCH: 35 pg — ABNORMAL HIGH (ref 26.0–34.0)
MCHC: 35.3 g/dL (ref 30.0–36.0)
MCV: 99.2 fL (ref 78.0–100.0)
MONOS PCT: 13 %
Monocytes Absolute: 0.3 10*3/uL (ref 0.1–1.0)
NEUTROS ABS: 1 10*3/uL — AB (ref 1.7–7.7)
NEUTROS PCT: 38 %
Platelets: 90 10*3/uL — ABNORMAL LOW (ref 150–400)
RBC: 3.74 MIL/uL — AB (ref 4.22–5.81)
RDW: 14.8 % (ref 11.5–15.5)
WBC: 2.7 10*3/uL — AB (ref 4.0–10.5)

## 2016-09-20 LAB — COMPREHENSIVE METABOLIC PANEL
ALBUMIN: 4.1 g/dL (ref 3.5–5.0)
ALK PHOS: 83 U/L (ref 38–126)
ALT: 53 U/L (ref 17–63)
AST: 41 U/L (ref 15–41)
Anion gap: 9 (ref 5–15)
BUN: 15 mg/dL (ref 6–20)
CALCIUM: 8.9 mg/dL (ref 8.9–10.3)
CO2: 24 mmol/L (ref 22–32)
CREATININE: 1.18 mg/dL (ref 0.61–1.24)
Chloride: 102 mmol/L (ref 101–111)
GFR calc Af Amer: >60 mL/min (ref >60)
GFR calc non Af Amer: >60 mL/min (ref >60)
GLUCOSE: 188 mg/dL — AB (ref 65–99)
Potassium: 4.4 mmol/L (ref 3.5–5.1)
SODIUM: 135 mmol/L (ref 135–145)
Total Bilirubin: 0.9 mg/dL (ref 0.3–1.2)
Total Protein: 7.4 g/dL (ref 6.5–8.1)

## 2016-09-20 LAB — VITAMIN B12: Vitamin B-12: 282 pg/mL (ref 180–914)

## 2016-09-20 LAB — LACTATE DEHYDROGENASE: LDH: 111 U/L (ref 98–192)

## 2016-09-20 MED ORDER — CYANOCOBALAMIN 1000 MCG/ML IJ SOLN
1000.0000 ug | Freq: Once | INTRAMUSCULAR | Status: AC
  Administered 2016-09-20: 1000 ug via INTRAMUSCULAR
  Filled 2016-09-20: qty 1

## 2016-09-20 NOTE — Patient Instructions (Signed)
[place] at [facility]  Discharge Instructions:  You received b12 shot today.  Keep scheduled appointments and call for any concerns or questions.  _______________________________________________________________  Thank you for choosing [place] at [facility] to provide your oncology and hematology care.  To afford each patient quality time with our providers, please arrive at least 15 minutes before your scheduled appointment.  You need to re-schedule your appointment if you arrive 10 or more minutes late.  We strive to give you quality time with our providers, and arriving late affects you and other patients whose appointments are after yours.  Also, if you no show three or more times for appointments you may be dismissed from the clinic.  Again, thank you for choosing [place] at [address] hope is that these requests will allow you access to exceptional care and in a timely manner. _______________________________________________________________  If you have questions after your visit, please contact our office at [phone] between the hours of 8:30 a.m. and 5:00 p.m. Voicemails left after 4:30 p.m. will not be returned until the following business day. _______________________________________________________________  For prescription refill requests, have your pharmacy contact our office. _______________________________________________________________  Recommendations made by the consultant and any test results will be sent to your referring physician. _______________________________________________________________

## 2016-09-20 NOTE — Progress Notes (Signed)
Patient tolerated vitamin b12 shot well with no complaints voiced.  Site clean and dry with band aid applied.  No bruising or swelling noted at site.  VSS with discharge.  Left ambulatory and reminded of new appointment schedule and copy given.  Patient verbalized understanding.

## 2016-09-24 LAB — MULTIPLE MYELOMA PANEL, SERUM
ALBUMIN/GLOB SERPL: 1.2 (ref 0.7–1.7)
ALPHA2 GLOB SERPL ELPH-MCNC: 0.8 g/dL (ref 0.4–1.0)
Albumin SerPl Elph-Mcnc: 3.8 g/dL (ref 2.9–4.4)
Alpha 1: 0.2 g/dL (ref 0.0–0.4)
B-GLOBULIN SERPL ELPH-MCNC: 1.1 g/dL (ref 0.7–1.3)
GAMMA GLOB SERPL ELPH-MCNC: 1 g/dL (ref 0.4–1.8)
GLOBULIN, TOTAL: 3.2 g/dL (ref 2.2–3.9)
IgA: 273 mg/dL (ref 90–386)
IgG (Immunoglobin G), Serum: 1133 mg/dL (ref 700–1600)
IgM (Immunoglobulin M), Srm: 19 mg/dL — ABNORMAL LOW (ref 20–172)
Total Protein ELP: 7 g/dL (ref 6.0–8.5)

## 2016-10-09 ENCOUNTER — Other Ambulatory Visit (HOSPITAL_COMMUNITY): Payer: Self-pay | Admitting: Emergency Medicine

## 2016-10-09 DIAGNOSIS — C9001 Multiple myeloma in remission: Secondary | ICD-10-CM

## 2016-10-09 MED ORDER — LENALIDOMIDE 15 MG PO CAPS: 15.0000 mg | ORAL_CAPSULE | Freq: Every day | ORAL | 0 refills | Status: DC

## 2016-10-09 NOTE — Progress Notes (Signed)
revlimid refilled

## 2016-10-23 ENCOUNTER — Other Ambulatory Visit (HOSPITAL_COMMUNITY): Payer: Medicare Other

## 2016-10-23 ENCOUNTER — Encounter (HOSPITAL_COMMUNITY): Payer: Self-pay

## 2016-10-23 ENCOUNTER — Encounter (HOSPITAL_BASED_OUTPATIENT_CLINIC_OR_DEPARTMENT_OTHER): Payer: Medicare Other

## 2016-10-23 ENCOUNTER — Encounter (HOSPITAL_COMMUNITY): Payer: Medicare Other | Attending: Hematology & Oncology | Admitting: Adult Health

## 2016-10-23 VITALS — BP 117/70 | HR 87 | Resp 18 | Ht 69.0 in | Wt 216.0 lb

## 2016-10-23 DIAGNOSIS — T451X5A Adverse effect of antineoplastic and immunosuppressive drugs, initial encounter: Secondary | ICD-10-CM

## 2016-10-23 DIAGNOSIS — D6481 Anemia due to antineoplastic chemotherapy: Secondary | ICD-10-CM | POA: Diagnosis not present

## 2016-10-23 DIAGNOSIS — E538 Deficiency of other specified B group vitamins: Secondary | ICD-10-CM | POA: Diagnosis not present

## 2016-10-23 DIAGNOSIS — C9001 Multiple myeloma in remission: Secondary | ICD-10-CM | POA: Diagnosis not present

## 2016-10-23 DIAGNOSIS — Z23 Encounter for immunization: Secondary | ICD-10-CM

## 2016-10-23 DIAGNOSIS — D72819 Decreased white blood cell count, unspecified: Secondary | ICD-10-CM

## 2016-10-23 DIAGNOSIS — D696 Thrombocytopenia, unspecified: Secondary | ICD-10-CM | POA: Diagnosis not present

## 2016-10-23 MED ORDER — CYANOCOBALAMIN 1000 MCG/ML IJ SOLN
1000.0000 ug | Freq: Once | INTRAMUSCULAR | Status: AC
  Administered 2016-10-23: 1000 ug via INTRAMUSCULAR
  Filled 2016-10-23: qty 1

## 2016-10-23 MED ORDER — INFLUENZA VAC SPLIT QUAD 0.5 ML IM SUSY
0.5000 mL | PREFILLED_SYRINGE | Freq: Once | INTRAMUSCULAR | Status: AC
  Administered 2016-10-23: 0.5 mL via INTRAMUSCULAR

## 2016-10-23 MED ORDER — INFLUENZA VAC SPLIT QUAD 0.5 ML IM SUSY
PREFILLED_SYRINGE | INTRAMUSCULAR | Status: AC
  Filled 2016-10-23: qty 0.5

## 2016-10-23 NOTE — Patient Instructions (Addendum)
[place] at [facility] Discharge Instructions  RECOMMENDATIONS MADE BY THE CONSULTANT AND ANY TEST RESULTS WILL BE SENT TO YOUR REFERRING PHYSICIAN.  You were seen today by Mike Craze NP. Return in 6 weeks for follow up, labs a few weeks before visit.    Thank you for choosing [place] at [facility] to provide your oncology and hematology care.  To afford each patient quality time with our provider, please arrive at least 15 minutes before your scheduled appointment time.    If you have a lab appointment with the [place] please come in thru the  Main Entrance and check in at the main information desk  You need to re-schedule your appointment should you arrive 10 or more minutes late.  We strive to give you quality time with our providers, and arriving late affects you and other patients whose appointments are after yours.  Also, if you no show three or more times for appointments you may be dismissed from the clinic at the providers discretion.     Again, thank you for choosing [facility].  Our hope is that these requests will decrease the amount of time that you wait before being seen by our physicians.       _____________________________________________________________  Should you have questions after your visit to [facility], please contact our office at [phone] between the hours of 8:30 a.m. and 4:30 p.m.  Voicemails left after 4:30 p.m. will not be returned until the following business day.  For prescription refill requests, have your pharmacy contact our office.       Resources For Cancer Patients and their Caregivers ? American Cancer Society: Can assist with transportation, wigs, general needs, runs Look Good Feel Better.        940-084-5175 ? Cancer Care: Provides financial assistance, online support groups, medication/co-pay assistance.  1-800-813-HOPE (205)204-5246) ? [facility] Assists [place] cancer patients and their families through emotional , educational and financial support.  803-781-1752 ? [place] DSS Where to apply for food stamps, Medicaid and utility assistance. 5123954000 ? RCATS: Transportation to medical appointments. (609)722-0533 ? Social Security Administration: May apply for disability if have a Stage IV cancer. ([phone] [phone] ? [employer], Disability and Transit Services: Assists with nutrition, care and transit needs. [place] Support Programs: @10RELATIVEDAYS @ > Cancer Support Group  2nd Tuesday of the month 1pm-2pm, Journey Room  > Creative Journey  3rd Tuesday of the month 1130am-1pm, Journey Room  > Look Good Feel Better  1st Wednesday of the month 10am-12 noon, Journey Room (Call [place] to register [phone])

## 2016-10-23 NOTE — Progress Notes (Signed)
John Singleton presents today for injection per the provider's orders.  B12 and Fluarix administration without incident; see MAR for injection details.  Patient tolerated procedure well and without incident.  No questions or complaints noted at this time.  Discharged ambulatory.

## 2016-10-27 NOTE — Progress Notes (Signed)
John Singleton, [place] [id number]   CLINIC:  Medical Oncology/Hematology  PCP:  John Fire, MD [place] [address] [phone]   REASON FOR VISIT:  Follow-up for IgG kappa multiple myeloma s/p autologous stem cell transplant (2014)  CURRENT THERAPY: Maintenance Revlimid 15 mg daily    BRIEF ONCOLOGIC HISTORY:    Multiple myeloma in remission ([place])   08/07/2012 Bone Marrow Transplant    Bone marrow transplant at [facility]:  (From Dr. Laverle Patter last note on 05/21/16)     INTERVAL HISTORY:  John Singleton 56 y.o. male returns for routine follow-up for multiple myeloma.   Overall, he tells me he feels very well.  Appetite and energy levels 75%. Continues to receive vitamin B12 injections monthly, which he says helps his energy levels "somewhat."    Denies any new concerns including focal bone pain, changes in bowel/bladder, frank bleeding episodes, abnormal bruising, headaches/dizziness, N&V, or rash.    Remains on Revlimid daily with good tolerance; endorses compliance and denies any missed doses.    He sees his PCP regularly. He sees his stem cell transplant team at [facility] annually for survivorship visit; saw them last in 07/2016.   He has not gotten his flu shot yet for this year; he would like to receive vaccine today.  He is also due for next vitamin B12 injection today as well.    Otherwise, he is without any complaints.      REVIEW OF SYSTEMS:  Review of Systems  Constitutional: Negative.  Negative for chills, fatigue and fever.  HENT:  Negative.  Negative for lump/mass and nosebleeds.   Eyes: Negative.   Respiratory: Negative.  Negative for cough and shortness of breath.   Cardiovascular: Negative.  Negative for chest pain and leg swelling.  Gastrointestinal: Negative.  Negative for abdominal pain, blood in stool, constipation, diarrhea,  nausea and vomiting.  Endocrine: Negative.   Genitourinary: Negative.  Negative for dysuria and hematuria.   Musculoskeletal: Negative.  Negative for arthralgias.  Skin: Negative.  Negative for rash.  Neurological: Negative.  Negative for dizziness and headaches.  Hematological: Negative.  Negative for adenopathy. Does not bruise/bleed easily.  Psychiatric/Behavioral: Negative.  Negative for depression and sleep disturbance. The patient is not nervous/anxious.      PAST MEDICAL/SURGICAL HISTORY:  Past Medical History:  Diagnosis Date  . B12 deficiency 10/23/2015  . Cancer ([place])   . Diabetes mellitus without complication ([place])   . HTN (hypertension)   . Multiple myeloma ([place])   . Multiple myeloma in remission ([place]) 07/30/2015  . Pneumonia   . Sepsis(995.91)   . Stem cells transplant status [facility])    Past Surgical History:  Procedure Laterality Date  . NOSE SURGERY     "when I was a child"     SOCIAL HISTORY:  Social History   Social History  . Marital status: Legally Separated    Spouse name: N/A  . Number of children: N/A  . Years of education: N/A   Occupational History  . Not on file.   Social History Main Topics  . Smoking status: Never Smoker  . Smokeless tobacco: Never Used  . Alcohol use No     Comment: occasional  . Drug use: No  . Sexual activity: Yes    Birth control/ protection: None   Other Topics Concern  . Not on file  Social History Narrative  . No narrative on file    FAMILY HISTORY:  History reviewed. No pertinent family history.  CURRENT MEDICATIONS:  Outpatient Encounter Prescriptions as of 10/23/2016  Medication Sig  . acyclovir (ZOVIRAX) 800 MG tablet Take 800 mg by mouth 2 (two) times daily.  Marland Kitchen aspirin 325 MG EC tablet Take 325 mg by mouth daily.  . Calcium Carbonate-Vitamin D 600-400 MG-UNIT tablet Take 1 tablet by mouth 2 (two) times daily.  . cholecalciferol (VITAMIN D) 1000 UNITS tablet Take 1,000 Units by mouth daily.  Marland Kitchen  lenalidomide (REVLIMID) 15 MG capsule Take 1 capsule (15 mg total) by mouth daily.  . magnesium oxide (MAG-OX) 400 MG tablet Take 400 mg by mouth daily.  . Multiple Vitamin (MULTIVITAMIN WITH MINERALS) TABS tablet Take 1 tablet by mouth daily.  . ramipril (ALTACE) 5 MG capsule Take 5 mg by mouth daily.  . simvastatin (ZOCOR) 20 MG tablet Take 20 mg by mouth at bedtime.  . metFORMIN (GLUCOPHAGE) 500 MG tablet Take 1 tablet (500 mg total) by mouth 2 (two) times daily with a meal.  . [EXPIRED] cyanocobalamin ((VITAMIN B-12)) injection 1,000 mcg    No facility-administered encounter medications on file as of 10/23/2016.     ALLERGIES:  No Known Allergies   PHYSICAL EXAM:  ECOG Performance status: 0 - Asymptomatic   Vitals:   10/23/16 1028  BP: 117/70  Pulse: 87  Resp: 18  SpO2: 98%   Filed Weights   10/23/16 1028  Weight: 216 lb (98 kg)    Physical Exam  Constitutional: He is oriented to person, place, and time and well-developed, well-nourished, and in no distress.  HENT:  Head: Normocephalic.  Mouth/Throat: Oropharynx is clear and moist. No oropharyngeal exudate.  Eyes: Pupils are equal, round, and reactive to light. Conjunctivae are normal. No scleral icterus.  Neck: Normal range of motion. Neck supple.  Cardiovascular: Normal rate and regular rhythm.   Pulmonary/Chest: Effort normal and breath sounds normal. No respiratory distress. He has no wheezes.  Abdominal: Soft. Bowel sounds are normal. There is no tenderness.  Musculoskeletal: Normal range of motion. He exhibits no edema.  Lymphadenopathy:    He has no cervical adenopathy.       Right: No supraclavicular adenopathy present.       Left: No supraclavicular adenopathy present.  Neurological: He is alert and oriented to person, place, and time. No cranial nerve deficit. Gait normal.  Skin: Skin is warm and dry. No rash noted.  Psychiatric: Mood, memory, affect and judgment normal.  Nursing note and vitals  reviewed.    LABORATORY DATA:  I have reviewed the labs as listed.  CBC    Component Value Date/Time   WBC 2.7 (L) 09/20/2016 0929   RBC 3.74 (L) 09/20/2016 0929   HGB 13.1 09/20/2016 0929   HCT 37.1 (L) 09/20/2016 0929   PLT 90 (L) 09/20/2016 0929   MCV 99.2 09/20/2016 0929   MCH 35.0 (H) 09/20/2016 0929   MCHC 35.3 09/20/2016 0929   RDW 14.8 09/20/2016 0929   LYMPHSABS 1.0 09/20/2016 0929   MONOABS 0.3 09/20/2016 0929   EOSABS 0.3 09/20/2016 0929   BASOSABS 0.1 09/20/2016 0929   CMP Latest Ref Rng & Units 09/20/2016 05/16/2016 02/20/2016  Glucose 65 - 99 mg/dL 188(H) 182(H) 185(H)  BUN 6 - 20 mg/dL 15 15 15   Creatinine 0.61 - 1.24 mg/dL 1.18 1.07 1.21  Sodium 135 - 145 mmol/L 135 134(L) 133(L)  Potassium 3.5 - 5.1 mmol/L  4.4 3.9 3.6  Chloride 101 - 111 mmol/L 102 104 101  CO2 22 - 32 mmol/L 24 23 23   Calcium 8.9 - 10.3 mg/dL 8.9 8.7(L) 8.6(L)  Total Protein 6.5 - 8.1 g/dL 7.4 7.0 7.1  Total Bilirubin 0.3 - 1.2 mg/dL 0.9 0.7 0.9  Alkaline Phos 38 - 126 U/L 83 76 62  AST 15 - 41 U/L 41 39 31  ALT 17 - 63 U/L 53 50 41   Results for MYCAL, CONDE (MRN [id number])   Ref. Range 09/20/2016 09:29  LDH Latest Ref Range: 98 - 192 U/L 111  Vitamin B12 Latest Ref Range: 180 - 914 pg/mL 282   Results for BRAD, LIEURANCE (MRN [id number])   Ref. Range 09/20/2016 09:29  Total Protein ELP Latest Ref Range: 6.0 - 8.5 g/dL 7.0  Albumin SerPl Elph-Mcnc Latest Ref Range: 2.9 - 4.4 g/dL 3.8  Albumin/Glob SerPl Latest Ref Range: 0.7 - 1.7  1.2  Alpha2 Glob SerPl Elph-Mcnc Latest Ref Range: 0.4 - 1.0 g/dL 0.8  Alpha 1 Latest Ref Range: 0.0 - 0.4 g/dL 0.2  Gamma Glob SerPl Elph-Mcnc Latest Ref Range: 0.4 - 1.8 g/dL 1.0  IFE 1 Unknown Comment  Globulin, Total Latest Ref Range: 2.2 - 3.9 g/dL 3.2  B-Globulin SerPl Elph-Mcnc Latest Ref Range: 0.7 - 1.3 g/dL 1.1   Results for CORAL, SOLER (MRN [id number])   Ref. Range 09/20/2016 09:29  IgG (Immunoglobin G), Serum Latest Ref Range: 700 -  1,600 mg/dL 1,133  IgA Latest Ref Range: 90 - 386 mg/dL 273  IgM (Immunoglobulin M), Srm Latest Ref Range: 20 - 172 mg/dL 19 (L)  M Protein SerPl Elph-Mcnc Latest Ref Range: Not Observed g/dL Not Observed      PENDING LABS:    DIAGNOSTIC IMAGING:    PATHOLOGY:     ASSESSMENT & PLAN:   IgG kappa multiple myeloma s/p autologous stem cell transplant (2014):  -Labs reviewed in detail with patient during visit today. He remains in remission based on SPEP/IFE results.  No CRAB symptoms.  He is now 4+ years out from stem cell transplant without evidence of recurrence, which is favorable.  -Remains on maintenance Revlimid 15 mg daily; tolerating well with no reportable side effects. Reports compliance and denies any missed doses. Continue Revlimid as directed.  -Return to cancer center in 6 months with myeloma labs ~2 weeks prior (so we may review results together at follow-up visit). He agrees with this plan. He continues to see his transplant team annually, which I encouraged him to continue to do as directed.    Vitamin B12 deficiency:  -Continue vitamin B12 injections monthly. He will receive injection today per orders.   Chronic anemia/Thrombocytopenia:  -Could be secondary to Revlimid.  -Hgb normal today at 13/1 g/dL.  Platelet count improved today from previous; 90,000 today; up from 68K in 11/2015, 70K 01/2016, and 81K in 04/2016.   -He is asymptomatic; no bleeding episodes and no abnormal bruising. Will keep monitoring.   Mild neutropenia:  -WBC stable at 2.7 today; ANC 1000. No reported fevers or infections since last visit. Will keep monitoring.   Health maintenance/Wellness promotion:  -Annual flu vaccine given today by nursing.  -Encouraged continued follow-up with PCP and other specialists for age/gender-appropriate exams and cancer screenings.       Dispo:  -Return to cancer center in 6 months with labs ~2 weeks prior.    All questions were answered to patient's  stated satisfaction. Encouraged patient to call  with any new concerns or questions before his next visit to the cancer center and we can certain see him sooner, if needed.    Plan of care discussed with Dr. Talbert Cage, who agrees with the above aforementioned.    Orders placed this encounter:  Orders Placed This Encounter  Procedures  . CBC with Differential/Platelet  . Comprehensive metabolic panel  . Vitamin B12  . Immunofixation electrophoresis  . IgG, IgA, IgM  . Kappa/lambda light chains  . Lactate dehydrogenase  . Protein electrophoresis, serum      John Craze, NP [place] [phone]

## 2016-11-06 ENCOUNTER — Other Ambulatory Visit (HOSPITAL_COMMUNITY): Payer: Self-pay | Admitting: Emergency Medicine

## 2016-11-06 DIAGNOSIS — C9001 Multiple myeloma in remission: Secondary | ICD-10-CM

## 2016-11-06 MED ORDER — LENALIDOMIDE 15 MG PO CAPS: 15.0000 mg | ORAL_CAPSULE | Freq: Every day | ORAL | 0 refills | Status: DC

## 2016-11-21 DIAGNOSIS — I10 Essential (primary) hypertension: Secondary | ICD-10-CM | POA: Diagnosis not present

## 2016-11-21 DIAGNOSIS — Z1389 Encounter for screening for other disorder: Secondary | ICD-10-CM | POA: Diagnosis not present

## 2016-11-21 DIAGNOSIS — C9001 Multiple myeloma in remission: Secondary | ICD-10-CM | POA: Diagnosis not present

## 2016-11-21 DIAGNOSIS — Z9484 Stem cells transplant status: Secondary | ICD-10-CM | POA: Diagnosis not present

## 2016-11-21 DIAGNOSIS — Z0000 Encounter for general adult medical examination without abnormal findings: Secondary | ICD-10-CM | POA: Diagnosis not present

## 2016-11-21 DIAGNOSIS — E785 Hyperlipidemia, unspecified: Secondary | ICD-10-CM | POA: Diagnosis not present

## 2016-11-21 DIAGNOSIS — E119 Type 2 diabetes mellitus without complications: Secondary | ICD-10-CM | POA: Diagnosis not present

## 2016-11-23 ENCOUNTER — Encounter (HOSPITAL_COMMUNITY): Payer: Medicare Other | Attending: Hematology & Oncology

## 2016-11-23 ENCOUNTER — Encounter (HOSPITAL_COMMUNITY): Payer: Self-pay

## 2016-11-23 VITALS — BP 126/85 | HR 89 | Temp 98.6°F | Resp 16

## 2016-11-23 DIAGNOSIS — E538 Deficiency of other specified B group vitamins: Secondary | ICD-10-CM | POA: Diagnosis present

## 2016-11-23 DIAGNOSIS — C9001 Multiple myeloma in remission: Secondary | ICD-10-CM | POA: Insufficient documentation

## 2016-11-23 MED ORDER — CYANOCOBALAMIN 1000 MCG/ML IJ SOLN
INTRAMUSCULAR | Status: AC
  Filled 2016-11-23: qty 1

## 2016-11-23 MED ORDER — CYANOCOBALAMIN 1000 MCG/ML IJ SOLN
1000.0000 ug | Freq: Once | INTRAMUSCULAR | Status: AC
  Administered 2016-11-23: 1000 ug via INTRAMUSCULAR

## 2016-11-23 NOTE — Progress Notes (Signed)
John Singleton presents today for injection per MD orders. B12 1,044mcg administered IM in right Upper Arm. Administration without incident. Patient tolerated well.   Treatment given per orders. Patient tolerated it well without problems. Vitals stable and discharged home from clinic ambulatory. Follow up as scheduled.

## 2016-11-23 NOTE — Patient Instructions (Signed)
[facility] at [facility] Discharge Instructions  RECOMMENDATIONS MADE BY THE CONSULTANT AND ANY TEST RESULTS WILL BE SENT TO YOUR REFERRING PHYSICIAN.  B12 injection done Follow up as scheduled.  Thank you for choosing [facility] at [facility] to provide your oncology and hematology care.  To afford each patient quality time with our provider, please arrive at least 15 minutes before your scheduled appointment time.    If you have a lab appointment with the Cancer Center please come in thru the  Main Entrance and check in at the main information desk  You need to re-schedule your appointment should you arrive 10 or more minutes late.  We strive to give you quality time with our providers, and arriving late affects you and other patients whose appointments are after yours.  Also, if you no show three or more times for appointments you may be dismissed from the clinic at the providers discretion.     Again, thank you for choosing [facility].  Our hope is that these requests will decrease the amount of time that you wait before being seen by our physicians.       _____________________________________________________________  Should you have questions after your visit to [facility], please contact our office at [phone] between the hours of 8:30 a.m. and 4:30 p.m.  Voicemails left after 4:30 p.m. will not be returned until the following business day.  For prescription refill requests, have your pharmacy contact our office.       Resources For Cancer Patients and their Caregivers ? American Cancer Society: Can assist with transportation, wigs, general needs, runs Look Good Feel Better.        1-888-227-6333 ? Cancer Care: Provides financial assistance, online support groups, medication/co-pay assistance.  1-800-813-HOPE (4673) ? [facility] Assists [place] cancer patients and their  families through emotional , educational and financial support.  336-427-4357 ? [place] DSS Where to apply for food stamps, Medicaid and utility assistance. 336-342-1394 ? RCATS: Transportation to medical appointments. 336-347-2287 ? Social Security Administration: May apply for disability if have a Stage IV cancer. 336-342-7796 1-800-772-1213 ? [place] Aging, Disability and Transit Services: Assists with nutrition, care and transit needs. 336-349-2343  Cancer Center Support Programs: @10RELATIVEDAYS@ > Cancer Support Group  2nd Tuesday of the month 1pm-2pm, Journey Room  > Creative Journey  3rd Tuesday of the month 1130am-1pm, Journey Room  > Look Good Feel Better  1st Wednesday of the month 10am-12 noon, Journey Room (Call American Cancer Society to register 1-800-395-5775)   

## 2016-12-04 ENCOUNTER — Other Ambulatory Visit (HOSPITAL_COMMUNITY): Payer: Self-pay | Admitting: Emergency Medicine

## 2016-12-04 DIAGNOSIS — C9001 Multiple myeloma in remission: Secondary | ICD-10-CM

## 2016-12-04 MED ORDER — LENALIDOMIDE 15 MG PO CAPS: 15.0000 mg | ORAL_CAPSULE | Freq: Every day | ORAL | 0 refills | Status: DC

## 2016-12-25 ENCOUNTER — Encounter (HOSPITAL_COMMUNITY): Payer: Medicare Other | Attending: Hematology & Oncology

## 2016-12-25 ENCOUNTER — Encounter (HOSPITAL_COMMUNITY): Payer: Self-pay

## 2016-12-25 VITALS — BP 115/87 | HR 80 | Temp 97.4°F | Resp 20

## 2016-12-25 DIAGNOSIS — E538 Deficiency of other specified B group vitamins: Secondary | ICD-10-CM | POA: Diagnosis present

## 2016-12-25 DIAGNOSIS — C9001 Multiple myeloma in remission: Secondary | ICD-10-CM | POA: Insufficient documentation

## 2016-12-25 MED ORDER — CYANOCOBALAMIN 1000 MCG/ML IJ SOLN
1000.0000 ug | Freq: Once | INTRAMUSCULAR | Status: AC
  Administered 2016-12-25: 1000 ug via INTRAMUSCULAR
  Filled 2016-12-25: qty 1

## 2016-12-25 NOTE — Patient Instructions (Signed)
[facility] at [facility] Discharge Instructions  RECOMMENDATIONS MADE BY THE CONSULTANT AND ANY TEST RESULTS WILL BE SENT TO YOUR REFERRING PHYSICIAN.  Received Vit B12 injection today. Follow-up as scheduled. Call clinic for any questions or concerns  Thank you for choosing [facility] at [facility] to provide your oncology and hematology care.  To afford each patient quality time with our provider, please arrive at least 15 minutes before your scheduled appointment time.    If you have a lab appointment with the Cancer Center please come in thru the  Main Entrance and check in at the main information desk  You need to re-schedule your appointment should you arrive 10 or more minutes late.  We strive to give you quality time with our providers, and arriving late affects you and other patients whose appointments are after yours.  Also, if you no show three or more times for appointments you may be dismissed from the clinic at the providers discretion.     Again, thank you for choosing [facility].  Our hope is that these requests will decrease the amount of time that you wait before being seen by our physicians.       _____________________________________________________________  Should you have questions after your visit to [facility], please contact our office at [phone] between the hours of 8:30 a.m. and 4:30 p.m.  Voicemails left after 4:30 p.m. will not be returned until the following business day.  For prescription refill requests, have your pharmacy contact our office.       Resources For Cancer Patients and their Caregivers ? American Cancer Society: Can assist with transportation, wigs, general needs, runs Look Good Feel Better.        1-888-227-6333 ? Cancer Care: Provides financial assistance, online support groups, medication/co-pay assistance.  1-800-813-HOPE (4673) ? [facility] Assists [place] cancer patients and their families through emotional , educational and financial support.  336-427-4357 ? [place] DSS Where to apply for food stamps, Medicaid and utility assistance. 336-342-1394 ? RCATS: Transportation to medical appointments. 336-347-2287 ? Social Security Administration: May apply for disability if have a Stage IV cancer. 336-342-7796 1-800-772-1213 ? [place] Aging, Disability and Transit Services: Assists with nutrition, care and transit needs. 336-349-2343  Cancer Center Support Programs: @10RELATIVEDAYS@ > Cancer Support Group  2nd Tuesday of the month 1pm-2pm, Journey Room  > Creative Journey  3rd Tuesday of the month 1130am-1pm, Journey Room  > Look Good Feel Better  1st Wednesday of the month 10am-12 noon, Journey Room (Call American Cancer Society to register 1-800-395-5775)   

## 2016-12-25 NOTE — Progress Notes (Signed)
John Singleton tolerated Vit B12 injection well without complaints or incident. VSS Pt discharged self ambulatory in satisfactory condition

## 2017-01-08 ENCOUNTER — Telehealth (HOSPITAL_COMMUNITY): Payer: Self-pay | Admitting: Oncology

## 2017-01-08 ENCOUNTER — Other Ambulatory Visit (HOSPITAL_COMMUNITY): Payer: Self-pay | Admitting: *Deleted

## 2017-01-08 DIAGNOSIS — C9001 Multiple myeloma in remission: Secondary | ICD-10-CM

## 2017-01-08 MED ORDER — LENALIDOMIDE 15 MG PO CAPS: 15.0000 mg | ORAL_CAPSULE | Freq: Every day | ORAL | 0 refills | Status: DC

## 2017-01-08 NOTE — Telephone Encounter (Signed)
FAXED REVLIMID RX

## 2017-01-25 ENCOUNTER — Inpatient Hospital Stay (HOSPITAL_COMMUNITY): Payer: Medicare Other | Attending: Oncology

## 2017-01-25 ENCOUNTER — Encounter (HOSPITAL_COMMUNITY): Payer: Self-pay

## 2017-01-25 VITALS — BP 127/82 | HR 89 | Temp 98.3°F | Resp 18

## 2017-01-25 DIAGNOSIS — E538 Deficiency of other specified B group vitamins: Secondary | ICD-10-CM

## 2017-01-25 MED ORDER — CYANOCOBALAMIN 1000 MCG/ML IJ SOLN
1000.0000 ug | Freq: Once | INTRAMUSCULAR | Status: AC
  Administered 2017-01-25: 1000 ug via INTRAMUSCULAR

## 2017-01-25 NOTE — Progress Notes (Signed)
John Singleton tolerated Vit B12 injection well without complaints or incident. VSS Pt discharged self ambulatory in satisfactory condition

## 2017-01-25 NOTE — Patient Instructions (Signed)
[facility] at [facility] Discharge Instructions  RECOMMENDATIONS MADE BY THE CONSULTANT AND ANY TEST RESULTS WILL BE SENT TO YOUR REFERRING PHYSICIAN.  Received Vit B12 injection today. Follow-up as scheduled. Call clinic for any questions or concerns  Thank you for choosing [facility] at [facility] to provide your oncology and hematology care.  To afford each patient quality time with our provider, please arrive at least 15 minutes before your scheduled appointment time.    If you have a lab appointment with the Cancer Center please come in thru the  Main Entrance and check in at the main information desk  You need to re-schedule your appointment should you arrive 10 or more minutes late.  We strive to give you quality time with our providers, and arriving late affects you and other patients whose appointments are after yours.  Also, if you no show three or more times for appointments you may be dismissed from the clinic at the providers discretion.     Again, thank you for choosing [facility].  Our hope is that these requests will decrease the amount of time that you wait before being seen by our physicians.       _____________________________________________________________  Should you have questions after your visit to [facility], please contact our office at [phone] between the hours of 8:30 a.m. and 4:30 p.m.  Voicemails left after 4:30 p.m. will not be returned until the following business day.  For prescription refill requests, have your pharmacy contact our office.       Resources For Cancer Patients and their Caregivers ? American Cancer Society: Can assist with transportation, wigs, general needs, runs Look Good Feel Better.        1-888-227-6333 ? Cancer Care: Provides financial assistance, online support groups, medication/co-pay assistance.  1-800-813-HOPE (4673) ? [facility] Assists [place] cancer patients and their families through emotional , educational and financial support.  336-427-4357 ? [place] DSS Where to apply for food stamps, Medicaid and utility assistance. 336-342-1394 ? RCATS: Transportation to medical appointments. 336-347-2287 ? Social Security Administration: May apply for disability if have a Stage IV cancer. 336-342-7796 1-800-772-1213 ? [place] Aging, Disability and Transit Services: Assists with nutrition, care and transit needs. 336-349-2343  Cancer Center Support Programs: @10RELATIVEDAYS@ > Cancer Support Group  2nd Tuesday of the month 1pm-2pm, Journey Room  > Creative Journey  3rd Tuesday of the month 1130am-1pm, Journey Room  > Look Good Feel Better  1st Wednesday of the month 10am-12 noon, Journey Room (Call American Cancer Society to register 1-800-395-5775)   

## 2017-02-05 ENCOUNTER — Other Ambulatory Visit (HOSPITAL_COMMUNITY): Payer: Self-pay | Admitting: Emergency Medicine

## 2017-02-05 DIAGNOSIS — C9001 Multiple myeloma in remission: Secondary | ICD-10-CM

## 2017-02-05 MED ORDER — LENALIDOMIDE 15 MG PO CAPS: 15.0000 mg | ORAL_CAPSULE | Freq: Every day | ORAL | 0 refills | Status: DC

## 2017-02-05 NOTE — Progress Notes (Signed)
revlimid refilled

## 2017-02-19 DIAGNOSIS — R51 Headache: Secondary | ICD-10-CM | POA: Diagnosis not present

## 2017-02-19 DIAGNOSIS — J069 Acute upper respiratory infection, unspecified: Secondary | ICD-10-CM | POA: Diagnosis not present

## 2017-02-19 DIAGNOSIS — Z7982 Long term (current) use of aspirin: Secondary | ICD-10-CM | POA: Diagnosis not present

## 2017-02-19 DIAGNOSIS — Z7984 Long term (current) use of oral hypoglycemic drugs: Secondary | ICD-10-CM | POA: Diagnosis not present

## 2017-02-19 DIAGNOSIS — R0981 Nasal congestion: Secondary | ICD-10-CM | POA: Diagnosis not present

## 2017-02-19 DIAGNOSIS — Z79899 Other long term (current) drug therapy: Secondary | ICD-10-CM | POA: Diagnosis not present

## 2017-02-19 DIAGNOSIS — E119 Type 2 diabetes mellitus without complications: Secondary | ICD-10-CM | POA: Diagnosis not present

## 2017-02-19 DIAGNOSIS — I10 Essential (primary) hypertension: Secondary | ICD-10-CM | POA: Diagnosis not present

## 2017-02-19 DIAGNOSIS — R05 Cough: Secondary | ICD-10-CM | POA: Diagnosis not present

## 2017-02-19 DIAGNOSIS — J341 Cyst and mucocele of nose and nasal sinus: Secondary | ICD-10-CM | POA: Diagnosis not present

## 2017-02-21 DIAGNOSIS — J0100 Acute maxillary sinusitis, unspecified: Secondary | ICD-10-CM | POA: Diagnosis not present

## 2017-02-21 DIAGNOSIS — E119 Type 2 diabetes mellitus without complications: Secondary | ICD-10-CM | POA: Diagnosis not present

## 2017-02-21 DIAGNOSIS — J069 Acute upper respiratory infection, unspecified: Secondary | ICD-10-CM | POA: Diagnosis not present

## 2017-02-25 ENCOUNTER — Encounter (HOSPITAL_COMMUNITY): Payer: Self-pay

## 2017-02-25 ENCOUNTER — Inpatient Hospital Stay (HOSPITAL_COMMUNITY): Payer: Medicare Other | Attending: Oncology

## 2017-02-25 VITALS — BP 133/73 | HR 100 | Temp 98.1°F | Resp 18

## 2017-02-25 DIAGNOSIS — E538 Deficiency of other specified B group vitamins: Secondary | ICD-10-CM | POA: Insufficient documentation

## 2017-02-25 MED ORDER — CYANOCOBALAMIN 1000 MCG/ML IJ SOLN
INTRAMUSCULAR | Status: AC
  Filled 2017-02-25: qty 1

## 2017-02-25 MED ORDER — CYANOCOBALAMIN 1000 MCG/ML IJ SOLN
1000.0000 ug | Freq: Once | INTRAMUSCULAR | Status: AC
  Administered 2017-02-25: 1000 ug via INTRAMUSCULAR

## 2017-02-25 NOTE — Progress Notes (Signed)
John Singleton tolerated Vit B12 injection well without complaints or incident. VSS Pt discharged self ambulatory in satisfactory condition

## 2017-02-25 NOTE — Patient Instructions (Signed)
[facility] at [facility] Discharge Instructions  RECOMMENDATIONS MADE BY THE CONSULTANT AND ANY TEST RESULTS WILL BE SENT TO YOUR REFERRING PHYSICIAN.  Received Vit B12 injection today. Follow-up as scheduled. Call clinic for any questions or concerns  Thank you for choosing [facility] at [facility] to provide your oncology and hematology care.  To afford each patient quality time with our provider, please arrive at least 15 minutes before your scheduled appointment time.    If you have a lab appointment with the Cancer Center please come in thru the  Main Entrance and check in at the main information desk  You need to re-schedule your appointment should you arrive 10 or more minutes late.  We strive to give you quality time with our providers, and arriving late affects you and other patients whose appointments are after yours.  Also, if you no show three or more times for appointments you may be dismissed from the clinic at the providers discretion.     Again, thank you for choosing [facility].  Our hope is that these requests will decrease the amount of time that you wait before being seen by our physicians.       _____________________________________________________________  Should you have questions after your visit to [facility], please contact our office at [phone] between the hours of 8:30 a.m. and 4:30 p.m.  Voicemails left after 4:30 p.m. will not be returned until the following business day.  For prescription refill requests, have your pharmacy contact our office.       Resources For Cancer Patients and their Caregivers ? American Cancer Society: Can assist with transportation, wigs, general needs, runs Look Good Feel Better.        1-888-227-6333 ? Cancer Care: Provides financial assistance, online support groups, medication/co-pay assistance.  1-800-813-HOPE (4673) ? [facility] Assists [place] cancer patients and their families through emotional , educational and financial support.  336-427-4357 ? [place] DSS Where to apply for food stamps, Medicaid and utility assistance. 336-342-1394 ? RCATS: Transportation to medical appointments. 336-347-2287 ? Social Security Administration: May apply for disability if have a Stage IV cancer. 336-342-7796 1-800-772-1213 ? [place] Aging, Disability and Transit Services: Assists with nutrition, care and transit needs. 336-349-2343  Cancer Center Support Programs: @10RELATIVEDAYS@ > Cancer Support Group  2nd Tuesday of the month 1pm-2pm, Journey Room  > Creative Journey  3rd Tuesday of the month 1130am-1pm, Journey Room  > Look Good Feel Better  1st Wednesday of the month 10am-12 noon, Journey Room (Call American Cancer Society to register 1-800-395-5775)   

## 2017-03-11 ENCOUNTER — Other Ambulatory Visit (HOSPITAL_COMMUNITY): Payer: Self-pay | Admitting: Emergency Medicine

## 2017-03-11 DIAGNOSIS — C9001 Multiple myeloma in remission: Secondary | ICD-10-CM

## 2017-03-11 MED ORDER — LENALIDOMIDE 15 MG PO CAPS: 15.0000 mg | ORAL_CAPSULE | Freq: Every day | ORAL | 0 refills | Status: DC

## 2017-03-11 NOTE — Progress Notes (Signed)
revlimid refilled

## 2017-03-25 ENCOUNTER — Encounter (HOSPITAL_COMMUNITY): Payer: Self-pay

## 2017-03-25 ENCOUNTER — Inpatient Hospital Stay (HOSPITAL_COMMUNITY): Payer: Medicare Other | Attending: Oncology

## 2017-03-25 ENCOUNTER — Ambulatory Visit (HOSPITAL_COMMUNITY): Payer: Medicare Other

## 2017-03-25 VITALS — BP 100/65 | HR 85 | Temp 98.0°F | Resp 18

## 2017-03-25 DIAGNOSIS — C9001 Multiple myeloma in remission: Secondary | ICD-10-CM | POA: Insufficient documentation

## 2017-03-25 DIAGNOSIS — E538 Deficiency of other specified B group vitamins: Secondary | ICD-10-CM | POA: Diagnosis not present

## 2017-03-25 MED ORDER — CYANOCOBALAMIN 1000 MCG/ML IJ SOLN
INTRAMUSCULAR | Status: AC
  Filled 2017-03-25: qty 1

## 2017-03-25 MED ORDER — CYANOCOBALAMIN 1000 MCG/ML IJ SOLN
1000.0000 ug | Freq: Once | INTRAMUSCULAR | Status: AC
  Administered 2017-03-25: 1000 ug via INTRAMUSCULAR

## 2017-03-25 NOTE — Progress Notes (Signed)
Sharmaine Base Tidd tolerated Vit B12 injection well without complaints or incident. VSS Pt discharged self ambulatory in satisfactory condition.

## 2017-03-25 NOTE — Patient Instructions (Signed)
[facility] at [facility] Discharge Instructions  Received Vit B12 injection today. Follow-up as scheduled. Call clinic for any questions or concerns   Thank you for choosing [facility] at [facility] to provide your oncology and hematology care.  To afford each patient quality time with our provider, please arrive at least 15 minutes before your scheduled appointment time.   If you have a lab appointment with the Cancer Center please come in thru the  Main Entrance and check in at the main information desk  You need to re-schedule your appointment should you arrive 10 or more minutes late.  We strive to give you quality time with our providers, and arriving late affects you and other patients whose appointments are after yours.  Also, if you no show three or more times for appointments you may be dismissed from the clinic at the providers discretion.     Again, thank you for choosing [facility].  Our hope is that these requests will decrease the amount of time that you wait before being seen by our physicians.       _____________________________________________________________  Should you have questions after your visit to [facility], please contact our office at [phone] between the hours of 8:30 a.m. and 4:30 p.m.  Voicemails left after 4:30 p.m. will not be returned until the following business day.  For prescription refill requests, have your pharmacy contact our office.       Resources For Cancer Patients and their Caregivers ? American Cancer Society: Can assist with transportation, wigs, general needs, runs Look Good Feel Better.        1-888-227-6333 ? Cancer Care: Provides financial assistance, online support groups, medication/co-pay assistance.  1-800-813-HOPE (4673) ? [facility] Assists [place] cancer patients and their families through emotional , educational and  financial support.  336-427-4357 ? [place] DSS Where to apply for food stamps, Medicaid and utility assistance. 336-342-1394 ? [facility]: Transportation to medical appointments. 336-347-2287 ? Social Security Administration: May apply for disability if have a Stage IV cancer. 336-342-7796 1-800-772-1213 ? [place] Aging, Disability and Transit Services: Assists with nutrition, care and transit needs. 336-349-2343  Cancer Center Support Programs:   > Cancer Support Group  2nd Tuesday of the month 1pm-2pm, Journey Room   > Creative Journey  3rd Tuesday of the month 1130am-1pm, Journey Room    

## 2017-04-01 ENCOUNTER — Other Ambulatory Visit (HOSPITAL_COMMUNITY): Payer: Self-pay | Admitting: Emergency Medicine

## 2017-04-01 DIAGNOSIS — C9001 Multiple myeloma in remission: Secondary | ICD-10-CM

## 2017-04-01 MED ORDER — LENALIDOMIDE 15 MG PO CAPS
15.0000 mg | ORAL_CAPSULE | Freq: Every day | ORAL | 0 refills | Status: DC
Start: 2017-04-01 — End: 2017-05-02

## 2017-04-01 NOTE — Progress Notes (Unsigned)
revlimid refilled

## 2017-04-10 ENCOUNTER — Inpatient Hospital Stay (HOSPITAL_COMMUNITY): Payer: Medicare Other

## 2017-04-10 DIAGNOSIS — D72819 Decreased white blood cell count, unspecified: Secondary | ICD-10-CM

## 2017-04-10 DIAGNOSIS — C9001 Multiple myeloma in remission: Secondary | ICD-10-CM

## 2017-04-10 DIAGNOSIS — D6481 Anemia due to antineoplastic chemotherapy: Secondary | ICD-10-CM

## 2017-04-10 DIAGNOSIS — E538 Deficiency of other specified B group vitamins: Secondary | ICD-10-CM | POA: Diagnosis not present

## 2017-04-10 DIAGNOSIS — T451X5A Adverse effect of antineoplastic and immunosuppressive drugs, initial encounter: Secondary | ICD-10-CM

## 2017-04-10 DIAGNOSIS — D696 Thrombocytopenia, unspecified: Secondary | ICD-10-CM

## 2017-04-10 LAB — COMPREHENSIVE METABOLIC PANEL
ALT: 55 U/L (ref 17–63)
ANION GAP: 9 (ref 5–15)
AST: 40 U/L (ref 15–41)
Albumin: 3.9 g/dL (ref 3.5–5.0)
Alkaline Phosphatase: 81 U/L (ref 38–126)
BILIRUBIN TOTAL: 0.9 mg/dL (ref 0.3–1.2)
BUN: 14 mg/dL (ref 6–20)
CALCIUM: 8.4 mg/dL — AB (ref 8.9–10.3)
CO2: 24 mmol/L (ref 22–32)
Chloride: 103 mmol/L (ref 101–111)
Creatinine, Ser: 1.06 mg/dL (ref 0.61–1.24)
GFR calc non Af Amer: >60 mL/min (ref >60)
Glucose, Bld: 161 mg/dL — ABNORMAL HIGH (ref 65–99)
POTASSIUM: 3.7 mmol/L (ref 3.5–5.1)
SODIUM: 136 mmol/L (ref 135–145)
TOTAL PROTEIN: 7.1 g/dL (ref 6.5–8.1)

## 2017-04-10 LAB — CBC WITH DIFFERENTIAL/PLATELET
BASOS PCT: 3 %
Basophils Absolute: 0.1 10*3/uL (ref 0.0–0.1)
EOS ABS: 0.3 10*3/uL (ref 0.0–0.7)
Eosinophils Relative: 13 %
HCT: 37 % — ABNORMAL LOW (ref 39.0–52.0)
Hemoglobin: 12.2 g/dL — ABNORMAL LOW (ref 13.0–17.0)
LYMPHS ABS: 0.8 10*3/uL (ref 0.7–4.0)
Lymphocytes Relative: 36 %
MCH: 33.9 pg (ref 26.0–34.0)
MCHC: 33 g/dL (ref 30.0–36.0)
MCV: 102.8 fL — ABNORMAL HIGH (ref 78.0–100.0)
MONO ABS: 0.2 10*3/uL (ref 0.1–1.0)
MONOS PCT: 9 %
NEUTROS PCT: 39 %
Neutro Abs: 0.9 10*3/uL — ABNORMAL LOW (ref 1.7–7.7)
Platelets: 99 10*3/uL — ABNORMAL LOW (ref 150–400)
RBC: 3.6 MIL/uL — ABNORMAL LOW (ref 4.22–5.81)
RDW: 15.6 % — AB (ref 11.5–15.5)
WBC: 2.3 10*3/uL — ABNORMAL LOW (ref 4.0–10.5)

## 2017-04-10 LAB — VITAMIN B12: Vitamin B-12: 317 pg/mL (ref 180–914)

## 2017-04-10 LAB — LACTATE DEHYDROGENASE: LDH: 119 U/L (ref 98–192)

## 2017-04-11 LAB — PROTEIN ELECTROPHORESIS, SERUM
A/G Ratio: 1.2 (ref 0.7–1.7)
ALPHA-2-GLOBULIN: 0.8 g/dL (ref 0.4–1.0)
Albumin ELP: 3.6 g/dL (ref 2.9–4.4)
Alpha-1-Globulin: 0.2 g/dL (ref 0.0–0.4)
Beta Globulin: 1.1 g/dL (ref 0.7–1.3)
GLOBULIN, TOTAL: 3.1 g/dL (ref 2.2–3.9)
Gamma Globulin: 1 g/dL (ref 0.4–1.8)
TOTAL PROTEIN ELP: 6.7 g/dL (ref 6.0–8.5)

## 2017-04-11 LAB — KAPPA/LAMBDA LIGHT CHAINS
KAPPA FREE LGHT CHN: 37.7 mg/L — AB (ref 3.3–19.4)
Kappa, lambda light chain ratio: 1.45 (ref 0.26–1.65)
LAMDA FREE LIGHT CHAINS: 26 mg/L (ref 5.7–26.3)

## 2017-04-11 LAB — IGG, IGA, IGM
IGG (IMMUNOGLOBIN G), SERUM: 990 mg/dL (ref 700–1600)
IgA: 245 mg/dL (ref 90–386)
IgM (Immunoglobulin M), Srm: 17 mg/dL — ABNORMAL LOW (ref 20–172)

## 2017-04-12 LAB — IMMUNOFIXATION ELECTROPHORESIS
IGA: 246 mg/dL (ref 90–386)
IGG (IMMUNOGLOBIN G), SERUM: 990 mg/dL (ref 700–1600)
IgM (Immunoglobulin M), Srm: 17 mg/dL — ABNORMAL LOW (ref 20–172)
Total Protein ELP: 6.7 g/dL (ref 6.0–8.5)

## 2017-04-17 ENCOUNTER — Emergency Department (HOSPITAL_COMMUNITY)
Admission: EM | Admit: 2017-04-17 | Discharge: 2017-04-17 | Disposition: A | Discharge status: Home / Self Care | Payer: Medicare Other | Attending: Emergency Medicine | Admitting: Emergency Medicine

## 2017-04-17 ENCOUNTER — Other Ambulatory Visit: Payer: Self-pay

## 2017-04-17 ENCOUNTER — Encounter (HOSPITAL_COMMUNITY): Payer: Self-pay | Admitting: Emergency Medicine

## 2017-04-17 DIAGNOSIS — E119 Type 2 diabetes mellitus without complications: Secondary | ICD-10-CM | POA: Insufficient documentation

## 2017-04-17 DIAGNOSIS — H5789 Other specified disorders of eye and adnexa: Secondary | ICD-10-CM | POA: Diagnosis present

## 2017-04-17 DIAGNOSIS — Z79899 Other long term (current) drug therapy: Secondary | ICD-10-CM | POA: Insufficient documentation

## 2017-04-17 DIAGNOSIS — I10 Essential (primary) hypertension: Secondary | ICD-10-CM | POA: Diagnosis not present

## 2017-04-17 DIAGNOSIS — Z7982 Long term (current) use of aspirin: Secondary | ICD-10-CM | POA: Insufficient documentation

## 2017-04-17 DIAGNOSIS — H109 Unspecified conjunctivitis: Secondary | ICD-10-CM

## 2017-04-17 DIAGNOSIS — J069 Acute upper respiratory infection, unspecified: Secondary | ICD-10-CM | POA: Diagnosis not present

## 2017-04-17 DIAGNOSIS — Z7984 Long term (current) use of oral hypoglycemic drugs: Secondary | ICD-10-CM | POA: Diagnosis not present

## 2017-04-17 MED ORDER — TOBRAMYCIN 0.3 % OP SOLN
2.0000 [drp] | Freq: Once | OPHTHALMIC | Status: AC
  Administered 2017-04-17: 2 [drp] via OPHTHALMIC
  Filled 2017-04-17: qty 5

## 2017-04-17 NOTE — ED Triage Notes (Signed)
PT c/o nasal drainage, dry cough and right eye drainage with swelling x3 days.

## 2017-04-17 NOTE — ED Provider Notes (Signed)
[facility] EMERGENCY DEPARTMENT Provider Note   CSN: [id number] Arrival date & time: 04/17/17  6606     History   Chief Complaint Chief Complaint  Patient presents with  . Eye Drainage    HPI John Singleton is a 57 y.o. male.  The history is provided by the patient.  Conjunctivitis  This is a new problem. The current episode started more than 2 days ago. The problem occurs hourly. The problem has been gradually worsening. Pertinent negatives include no chest pain, no abdominal pain and no shortness of breath. Associated symptoms comments: Nasal congestion. Nothing aggravates the symptoms. Nothing relieves the symptoms. He has tried nothing for the symptoms.    Past Medical History:  Diagnosis Date  . B12 deficiency 10/23/2015  . Cancer ([place])   . Diabetes mellitus without complication ([place])   . HTN (hypertension)   . Multiple myeloma ([place])   . Multiple myeloma in remission ([place]) 07/30/2015  . Pneumonia   . Sepsis(995.91)   . Stem cells transplant status [facility] 2)     Patient Active Problem List   Diagnosis Date Noted  . B12 deficiency 10/23/2015  . Multiple myeloma in remission ([place]) 07/30/2015  . DM2 (diabetes mellitus, type 2) ([place]) 07/30/2015  . Thrombocytopenia ([place]) 07/30/2015  . Leucopenia 07/30/2015  . Anemia 07/30/2015  . HTN (hypertension)   . Allergic transfusion reaction 08/18/2012  . H/O stem cell transplant ([place]) 08/07/2012    Past Surgical History:  Procedure Laterality Date  . LIMBAL STEM CELL TRANSPLANT    . NOSE SURGERY     "when I was a child"        Home Medications    Prior to Admission medications   Medication Sig Start Date End Date Taking? Authorizing Provider  acyclovir (ZOVIRAX) 800 MG tablet Take 800 mg by mouth 2 (two) times daily.    [provider]  aspirin 325 MG EC tablet Take 325 mg by mouth daily.    [provider]  Calcium Carbonate-Vitamin D 600-400 MG-UNIT tablet Take 1 tablet by mouth 2 (two) times  daily.    [provider]  cholecalciferol (VITAMIN D) 1000 UNITS tablet Take 1,000 Units by mouth daily.    [provider]  lenalidomide (REVLIMID) 15 MG capsule Take 1 capsule (15 mg total) by mouth daily. 04/01/17   Derek Jack, MD  magnesium oxide (MAG-OX) 400 MG tablet Take 400 mg by mouth daily.    [provider]  metFORMIN (GLUCOPHAGE) 500 MG tablet Take 1 tablet (500 mg total) by mouth 2 (two) times daily with a meal. 05/17/11 11/16/15  Nat Christen, MD  Multiple Vitamin (MULTIVITAMIN WITH MINERALS) TABS tablet Take 1 tablet by mouth daily.    [provider]  ramipril (ALTACE) 5 MG capsule Take 5 mg by mouth daily.    [provider]  simvastatin (ZOCOR) 20 MG tablet Take 20 mg by mouth at bedtime.    [provider]    Family History History reviewed. No pertinent family history.  Social History Social History   Tobacco Use  . Smoking status: Never Smoker  . Smokeless tobacco: Never Used  Substance Use Topics  . Alcohol use: No    Comment: occasional  . Drug use: No     Allergies   Patient has no known allergies.   Review of Systems Review of Systems  Constitutional: Negative for activity change.       All ROS Neg except as noted in HPI  HENT: Negative for nosebleeds.   Eyes: Positive for photophobia, pain, discharge and redness.  Respiratory: Negative for cough, shortness of breath and wheezing.   Cardiovascular: Negative for chest pain and palpitations.  Gastrointestinal: Negative for abdominal pain and blood in stool.  Genitourinary: Negative for dysuria, frequency and hematuria.  Musculoskeletal: Negative for arthralgias, back pain and neck pain.  Skin: Negative.   Neurological: Negative for dizziness, seizures and speech difficulty.  Psychiatric/Behavioral: Negative for confusion and hallucinations.     Physical Exam Updated Vital Signs BP 134/76 (BP Location: Right Arm)   Pulse 92   Temp  98.5 F (36.9 C) (Oral)   Resp 16   Ht _0  (1.727 m)   Wt 97.5 kg (215 lb)   SpO2 98%   BMI 32.69 kg/m   Physical Exam  Constitutional: He is oriented to person, place, and time. He appears well-developed and well-nourished.  Non-toxic appearance.  HENT:  Head: Normocephalic.  Right Ear: Tympanic membrane and external ear normal.  Left Ear: Tympanic membrane and external ear normal.  Eyes: Pupils are equal, round, and reactive to light. EOM and lids are normal. Right eye exhibits no hordeolum. No foreign body present in the right eye. Left eye exhibits no hordeolum. No foreign body present in the left eye. Right conjunctiva is injected. Right conjunctiva has no hemorrhage. Left conjunctiva has no hemorrhage. No scleral icterus.  Neck: Normal range of motion. Neck supple. Carotid bruit is not present.  Cardiovascular: Normal rate, regular rhythm, normal heart sounds, intact distal pulses and normal pulses.  Pulmonary/Chest: Breath sounds normal. No respiratory distress.  Abdominal: Soft. Bowel sounds are normal. There is no tenderness. There is no guarding.  Musculoskeletal: Normal range of motion.  Lymphadenopathy:       Head (right side): No submandibular adenopathy present.       Head (left side): No submandibular adenopathy present.    He has no cervical adenopathy.  Neurological: He is alert and oriented to person, place, and time. He has normal strength. No cranial nerve deficit or sensory deficit.  Skin: Skin is warm and dry.  Psychiatric: He has a normal mood and affect. His speech is normal.  Nursing note and vitals reviewed.    ED Treatments / Results  Labs (all labs ordered are listed, but only abnormal results are displayed) Labs Reviewed - No data to display  EKG None  Radiology No results found.  Procedures Procedures (including critical care time)  Medications Ordered in ED Medications - No data to display   Initial Impression / Assessment and Plan /  ED Course  I have reviewed the triage vital signs and the nursing notes.  Pertinent labs & imaging results that were available during my care of the patient were reviewed by me and considered in my medical decision making (see chart for details).       Final Clinical Impressions(s) / ED Diagnoses MDM  Vital signs reviewed.  The examination is consistent with conjunctivitis.  I discussed the contagious nature of this problem with the patient in terms which he understands.  We discussed the importance of good handwashing.  Patient is treated with tobramycin ophthalmic drops, cool compresses, Tylenol/ibuprofen for soreness.  He will follow-up with his ophthalmology specialist if not improving.   Final diagnoses:  Conjunctivitis of both eyes, unspecified conjunctivitis type  Upper respiratory tract infection, unspecified type    ED Discharge Orders    None       Lily Kocher, PA-C 04/17/17  [place], [place], MD 04/18/17 [phone]

## 2017-04-17 NOTE — ED Notes (Signed)
Mild swelling and redness to right eye. Reports dried drainage this morning to eye and irritation

## 2017-04-17 NOTE — Discharge Instructions (Addendum)
Your examination suggest upper respiratory infection and conjunctivitis or pinkeye.  Please use 2 drops of tobramycin in both eyes every 4 hours while awake for the next 4 or 5 days.  This is highly contagious.  Please wash her hands frequently, and keep your distance from others.  Please use Claritin D 2 times daily for congestion and cough.  Use Tylenol every 4 hours or ibuprofen every 6 hours for fever, headache, or aching.  Please see Dr. Legrand Rams for additional evaluation and management if not improving.

## 2017-04-24 ENCOUNTER — Inpatient Hospital Stay (HOSPITAL_COMMUNITY): Payer: Medicare Other | Attending: Oncology

## 2017-04-24 ENCOUNTER — Ambulatory Visit (HOSPITAL_COMMUNITY): Payer: Medicare Other

## 2017-04-24 ENCOUNTER — Inpatient Hospital Stay (HOSPITAL_BASED_OUTPATIENT_CLINIC_OR_DEPARTMENT_OTHER): Payer: Medicare Other | Admitting: Adult Health

## 2017-04-24 ENCOUNTER — Encounter (HOSPITAL_COMMUNITY): Payer: Self-pay | Admitting: Adult Health

## 2017-04-24 ENCOUNTER — Other Ambulatory Visit: Payer: Self-pay

## 2017-04-24 VITALS — BP 133/91 | HR 93 | Resp 18 | Ht 68.0 in | Wt 218.5 lb

## 2017-04-24 DIAGNOSIS — Z7984 Long term (current) use of oral hypoglycemic drugs: Secondary | ICD-10-CM | POA: Diagnosis not present

## 2017-04-24 DIAGNOSIS — E119 Type 2 diabetes mellitus without complications: Secondary | ICD-10-CM | POA: Diagnosis not present

## 2017-04-24 DIAGNOSIS — D61818 Other pancytopenia: Secondary | ICD-10-CM

## 2017-04-24 DIAGNOSIS — E538 Deficiency of other specified B group vitamins: Secondary | ICD-10-CM | POA: Diagnosis not present

## 2017-04-24 DIAGNOSIS — Z7982 Long term (current) use of aspirin: Secondary | ICD-10-CM | POA: Insufficient documentation

## 2017-04-24 DIAGNOSIS — C9001 Multiple myeloma in remission: Secondary | ICD-10-CM | POA: Insufficient documentation

## 2017-04-24 DIAGNOSIS — Z79899 Other long term (current) drug therapy: Secondary | ICD-10-CM | POA: Diagnosis not present

## 2017-04-24 MED ORDER — CYANOCOBALAMIN 1000 MCG/ML IJ SOLN
1000.0000 ug | Freq: Once | INTRAMUSCULAR | Status: AC
Start: 2017-04-24 — End: 2017-04-24
  Administered 2017-04-24: 1000 ug via INTRAMUSCULAR

## 2017-04-24 NOTE — Progress Notes (Signed)
John Singleton, [place] [address]   CLINIC:  Medical Oncology/Hematology  PCP:  [name], MD [place] [address] [phone]   REASON FOR VISIT:  Follow-up for IgG kappa multiple myeloma s/p autologous stem cell transplant (2014) AND vitamin B12 deficiency   CURRENT THERAPY: Maintenance Revlimid 15 mg daily AND vitamin B12 inj monthly    BRIEF ONCOLOGIC HISTORY:    Multiple myeloma in remission ([place])   08/07/2012 Bone Marrow Transplant    Bone marrow transplant at [facility]:  (From Dr. Laverle Patter note on 05/21/16)      INTERVAL HISTORY:  Mr. Kittleson 57 y.o. male returns for routine follow-up for multiple myeloma.   Here today unaccompanied.  Overall, he tells me he has been feeling "pretty well."  Appetite and energy levels both 75%.  He endorses recent sinus pain, "which I think is just because of all these changes in weather."  He endorses occasional runny nose and dry cough.  He did have recent ED visit in late March for conjunctivitis.  "I was given some ointment and my iron got better."  He denies any recurrent conjunctivitis symptoms.  Denies any recent fevers/chills.  Remains on Revlimid 15 mg daily as directed.  Denies any missed doses.  He tells me he is scheduled to see his transplant team at [facility] sometime in July.  He feels that he is tolerating the Revlimid well.  Denies any frank bleeding episodes including blood in his stools, dark/tarry stools, hematuria, nosebleeds, or gingival bleeding.  Denies any diarrhea or rash.  Denies headache, dizziness, nausea or vomiting.  Remains on monthly vitamin B12 inj monthly and is tolerating them well.    Reports that he sees his PCP regularly.  He is scheduled to see PCP sometime within the next couple of weeks for follow-up.   Otherwise, he is largely without other complaints today.      REVIEW  OF SYSTEMS:  Review of Systems  Per HPI.  Otherwise, 12 point ROS completed and negative except as stated above.   PAST MEDICAL/SURGICAL HISTORY:  Past Medical History:  Diagnosis Date  . B12 deficiency 10/23/2015  . Cancer ([place])   . Diabetes mellitus without complication ([place])   . HTN (hypertension)   . Multiple myeloma ([place])   . Multiple myeloma in remission ([place]) 07/30/2015  . Pneumonia   . Sepsis(995.91)   . Stem cells transplant status [facility])    Past Surgical History:  Procedure Laterality Date  . LIMBAL STEM CELL TRANSPLANT    . NOSE SURGERY     "when I was a child"     SOCIAL HISTORY:  Social History   Socioeconomic History  . Marital status: Legally Separated    Spouse name: Not on file  . Number of children: Not on file  . Years of education: Not on file  . Highest education level: Not on file  Occupational History  . Not on file  Social Needs  . Financial resource strain: Not on file  . Food insecurity:    Worry: Not on file    Inability: Not on file  . Transportation needs:    Medical: Not on file    Non-medical: Not on file  Tobacco Use  . Smoking status: Never Smoker  . Smokeless tobacco: Never Used  Substance and Sexual Activity  . Alcohol use: No  Comment: occasional  . Drug use: No  . Sexual activity: Yes    Birth control/protection: None  Lifestyle  . Physical activity:    Days per week: Not on file    Minutes per session: Not on file  . Stress: Not on file  Relationships  . Social connections:    Talks on phone: Not on file    Gets together: Not on file    Attends religious service: Not on file    Active member of club or organization: Not on file    Attends meetings of clubs or organizations: Not on file    Relationship status: Not on file  . Intimate partner violence:    Fear of current or ex partner: Not on file    Emotionally abused: Not on file    Physically abused: Not on file    Forced sexual activity: Not on file  Other  Topics Concern  . Not on file  Social History Narrative  . Not on file    FAMILY HISTORY:  History reviewed. No pertinent family history.  CURRENT MEDICATIONS:  Outpatient Encounter Medications as of 04/24/2017  Medication Sig  . aspirin 325 MG EC tablet Take 325 mg by mouth daily.  Marland Kitchen azithromycin (ZITHROMAX) 250 MG tablet TAKE 2 TABLETS BY MOUTH TODAY, THEN TAKE 1 TABLET DAILY FOR 4 DAYS  . Calcium Carbonate-Vitamin D 600-400 MG-UNIT tablet Take 1 tablet by mouth 2 (two) times daily.  . cholecalciferol (VITAMIN D) 1000 UNITS tablet Take 1,000 Units by mouth daily.  Marland Kitchen lenalidomide (REVLIMID) 15 MG capsule Take 1 capsule (15 mg total) by mouth daily.  . magnesium oxide (MAG-OX) 400 MG tablet Take 400 mg by mouth daily.  . Multiple Vitamin (MULTIVITAMIN WITH MINERALS) TABS tablet Take 1 tablet by mouth daily.  . ramipril (ALTACE) 5 MG capsule Take 5 mg by mouth daily.  . simvastatin (ZOCOR) 20 MG tablet Take 20 mg by mouth at bedtime.  . metFORMIN (GLUCOPHAGE) 500 MG tablet Take 1 tablet (500 mg total) by mouth 2 (two) times daily with a meal.  . [DISCONTINUED] acyclovir (ZOVIRAX) 800 MG tablet Take 800 mg by mouth 2 (two) times daily.   No facility-administered encounter medications on file as of 04/24/2017.     ALLERGIES:  No Known Allergies   PHYSICAL EXAM:  ECOG Performance status: 0 - Asymptomatic   Vitals:   04/24/17 1049  BP: (!) 133/91  Pulse: 93  Resp: 18  SpO2: 97%   Filed Weights   04/24/17 1049  Weight: 218 lb 8 oz (99.1 kg)    Physical Exam  Constitutional: He is oriented to person, place, and time and well-developed, well-nourished, and in no distress.  HENT:  Head: Normocephalic.  Mouth/Throat: Oropharynx is clear and moist. No oropharyngeal exudate.  Eyes: Pupils are equal, round, and reactive to light. Conjunctivae are normal. No scleral icterus.  Neck: Normal range of motion. Neck supple.  Cardiovascular: Normal rate and regular rhythm.    Pulmonary/Chest: Effort normal and breath sounds normal. No respiratory distress. He has no wheezes.  Abdominal: Soft. Bowel sounds are normal. There is no tenderness.  Musculoskeletal: Normal range of motion. He exhibits no edema.  Lymphadenopathy:    He has no cervical adenopathy.       Right: No supraclavicular adenopathy present.       Left: No supraclavicular adenopathy present.  Neurological: He is alert and oriented to person, place, and time. No cranial nerve deficit. Gait normal.  Skin: Skin  is warm and dry. No rash noted.  Psychiatric: Mood, memory, affect and judgment normal.  Nursing note and vitals reviewed.    LABORATORY DATA:  I have reviewed the labs as listed.  CBC    Component Value Date/Time   WBC 2.3 (L) 04/10/2017 0858   RBC 3.60 (L) 04/10/2017 0858   HGB 12.2 (L) 04/10/2017 0858   HCT 37.0 (L) 04/10/2017 0858   PLT 99 (L) 04/10/2017 0858   MCV 102.8 (H) 04/10/2017 0858   MCH 33.9 04/10/2017 0858   MCHC 33.0 04/10/2017 0858   RDW 15.6 (H) 04/10/2017 0858   LYMPHSABS 0.8 04/10/2017 0858   MONOABS 0.2 04/10/2017 0858   EOSABS 0.3 04/10/2017 0858   BASOSABS 0.1 04/10/2017 0858   CMP Latest Ref Rng & Units 04/10/2017 09/20/2016 05/16/2016  Glucose 65 - 99 mg/dL 161(H) 188(H) 182(H)  BUN 6 - 20 mg/dL 14 15 15   Creatinine 0.61 - 1.24 mg/dL 1.06 1.18 1.07  Sodium 135 - 145 mmol/L 136 135 134(L)  Potassium 3.5 - 5.1 mmol/L 3.7 4.4 3.9  Chloride 101 - 111 mmol/L 103 102 104  CO2 22 - 32 mmol/L 24 24 23   Calcium 8.9 - 10.3 mg/dL 8.4(L) 8.9 8.7(L)  Total Protein 6.5 - 8.1 g/dL 7.1 7.4 7.0  Total Bilirubin 0.3 - 1.2 mg/dL 0.9 0.9 0.7  Alkaline Phos 38 - 126 U/L 81 83 76  AST 15 - 41 U/L 40 41 39  ALT 17 - 63 U/L 55 53 50   Results for COLESTON, DIROSA (MRN [id number])   Ref. Range 04/10/2017 08:58  Vitamin B12 Latest Ref Range: 180 - 914 pg/mL 317  Total Protein ELP Latest Ref Range: 6.0 - 8.5 g/dL 6.7  Albumin ELP Latest Ref Range: 2.9 - 4.4 g/dL 3.6   Globulin, Total Latest Ref Range: 2.2 - 3.9 g/dL 3.1  A/G Ratio Latest Ref Range: 0.7 - 1.7  1.2  Alpha-1-Globulin Latest Ref Range: 0.0 - 0.4 g/dL 0.2  Alpha-2-Globulin Latest Ref Range: 0.4 - 1.0 g/dL 0.8  Beta Globulin Latest Ref Range: 0.7 - 1.3 g/dL 1.1  Gamma Globulin Latest Ref Range: 0.4 - 1.8 g/dL 1.0  M-SPIKE, % Latest Ref Range: Not Observed g/dL Not Observed  SPE Interp. Unknown Comment  Comment Unknown Comment  IgG (Immunoglobin G), Serum Latest Ref Range: 700 - 1,600 mg/dL 990  IgM (Immunoglobulin M), Srm Latest Ref Range: 20 - 172 mg/dL 17 (L)           PENDING LABS:    DIAGNOSTIC IMAGING:    PATHOLOGY:     ASSESSMENT & PLAN:   IgG kappa multiple myeloma s/p autologous stem cell transplant (2014):  -Labs from 04/10/17 reviewed in detail with patient during visit today. He remains in remission based on SPEP/IFE results.  No CRAB symptoms.  He is now nearly 5 years out from stem cell transplant without evidence of recurrence, which is favorable.  -Remains on maintenance Revlimid 15 mg daily as directed by [facility] transplant team; tolerating well with no reportable side effects. Reports compliance and denies any missed doses. Continue Revlimid as directed.  -He generally sees his transplant team at [facility] annually in June/July.   -Given that he is now 5 years out from transplant, we will move his visits to annually while maintaining lab work every 6 months. He is agreeable with this plan.  We will make arrangements for him to be seen annually every January at [facility] with labs ~1 week  prior, then we will collect labs only in June/July annually for continued surveillance of his multiple myeloma.  He knows to call us if he has any new focal bone pain or other concerns as it relates to his Revlimid or h/o cancer.     Chronic pancytopenia:  -Could be secondary to Revlimid.  -WBCs 2.3, with ANC 900.  Hgb 12.2 g/dL, platelet count mildly improved from  previous and is 99,000.   Vitamin B12 deficiency:  -Continue monthly vitamin B12 injections.  Injections to be given today by nursing.   Health maintenance/Wellness promotion:  -Encouraged continued follow-up with PCP and other specialists for age/gender-appropriate exams and cancer screenings.        Dispo:  -Continue vitamin B12 inj monthly.  -Return to cancer center in 01/2018 for follow-up with labs ~1 week prior. (CBC with diff, CMET, vitamin B12, myeloma panel, & kappa/lambda light chains) -Then labs only in 07/2018 (CBC with diff, CMET, myeloma panel, & kappa/lambda light chains).      All questions were answered to patient's stated satisfaction. Encouraged patient to call with any new concerns or questions before his next visit to the cancer center and we can certain see him sooner, if needed.      Orders placed this encounter:  No orders of the defined types were placed in this encounter.     Mike Craze, NP [place] [phone]

## 2017-04-24 NOTE — Progress Notes (Signed)
ORIS STAFFIERI presents today for injection per the provider's orders.  B12 administration without incident; see MAR for injection details.  Patient tolerated procedure well and without incident.  No questions or complaints noted at this time.  Discharged ambulatory.

## 2017-05-02 ENCOUNTER — Other Ambulatory Visit (HOSPITAL_COMMUNITY): Payer: Self-pay

## 2017-05-02 DIAGNOSIS — C9001 Multiple myeloma in remission: Secondary | ICD-10-CM

## 2017-05-02 MED ORDER — LENALIDOMIDE 15 MG PO CAPS
15.0000 mg | ORAL_CAPSULE | Freq: Every day | ORAL | 0 refills | Status: DC
Start: 2017-05-02 — End: 2017-06-03

## 2017-05-02 NOTE — Telephone Encounter (Signed)
Received refill request from patients pharmacy for Revlimid. Reviewed with provider, chart checked and refilled.  

## 2017-05-20 DIAGNOSIS — Z6833 Body mass index (BMI) 33.0-33.9, adult: Secondary | ICD-10-CM | POA: Diagnosis not present

## 2017-05-20 DIAGNOSIS — C9001 Multiple myeloma in remission: Secondary | ICD-10-CM | POA: Diagnosis not present

## 2017-05-20 DIAGNOSIS — E119 Type 2 diabetes mellitus without complications: Secondary | ICD-10-CM | POA: Diagnosis not present

## 2017-05-20 DIAGNOSIS — I10 Essential (primary) hypertension: Secondary | ICD-10-CM | POA: Diagnosis not present

## 2017-05-27 ENCOUNTER — Inpatient Hospital Stay (HOSPITAL_COMMUNITY): Payer: Medicare Other | Attending: Oncology

## 2017-05-27 ENCOUNTER — Other Ambulatory Visit: Payer: Self-pay

## 2017-05-27 ENCOUNTER — Encounter (HOSPITAL_COMMUNITY): Payer: Self-pay

## 2017-05-27 VITALS — BP 118/82 | HR 88 | Temp 98.6°F | Resp 16

## 2017-05-27 DIAGNOSIS — E538 Deficiency of other specified B group vitamins: Secondary | ICD-10-CM | POA: Insufficient documentation

## 2017-05-27 MED ORDER — CYANOCOBALAMIN 1000 MCG/ML IJ SOLN
1000.0000 ug | Freq: Once | INTRAMUSCULAR | Status: AC
  Administered 2017-05-27: 1000 ug via INTRAMUSCULAR

## 2017-05-27 MED ORDER — CYANOCOBALAMIN 1000 MCG/ML IJ SOLN
INTRAMUSCULAR | Status: AC
  Filled 2017-05-27: qty 1

## 2017-05-27 NOTE — Progress Notes (Signed)
Patient is taking irimedex and has not missed any doses and reports no side effects at this time.

## 2017-05-27 NOTE — Progress Notes (Signed)
John Singleton presents today for injection per MD orders. B12 1,000 mcg administered IM  in right Upper Arm. Administration without incident. Patient tolerated well.    Vitals stable and discharged home from clinic ambulatory. Follow up as scheduled.

## 2017-05-27 NOTE — Patient Instructions (Signed)
[facility] at [facility] Discharge Instructions  B12 injection today Follow up as scheduled.   Thank you for choosing  Cancer Center at [facility] to provide your oncology and hematology care.  To afford each patient quality time with our provider, please arrive at least 15 minutes before your scheduled appointment time.   If you have a lab appointment with the Cancer Center please come in thru the  Main Entrance and check in at the main information desk  You need to re-schedule your appointment should you arrive 10 or more minutes late.  We strive to give you quality time with our providers, and arriving late affects you and other patients whose appointments are after yours.  Also, if you no show three or more times for appointments you may be dismissed from the clinic at the providers discretion.     Again, thank you for choosing [facility].  Our hope is that these requests will decrease the amount of time that you wait before being seen by our physicians.       _____________________________________________________________  Should you have questions after your visit to [facility], please contact our office at [phone] between the hours of 8:30 a.m. and 4:30 p.m.  Voicemails left after 4:30 p.m. will not be returned until the following business day.  For prescription refill requests, have your pharmacy contact our office.       Resources For Cancer Patients and their Caregivers ? American Cancer Society: Can assist with transportation, wigs, general needs, runs Look Good Feel Better.        1-888-227-6333 ? Cancer Care: Provides financial assistance, online support groups, medication/co-pay assistance.  1-800-813-HOPE (4673) ? [facility] Assists [place] cancer patients and their families through emotional , educational and financial support.  336-427-4357 ? [place]  DSS Where to apply for food stamps, Medicaid and utility assistance. 336-342-1394 ? [facility]: Transportation to medical appointments. 336-347-2287 ? Social Security Administration: May apply for disability if have a Stage IV cancer. 336-342-7796 1-800-772-1213 ? [place] Aging, Disability and Transit Services: Assists with nutrition, care and transit needs. 336-349-2343  Cancer Center Support Programs:   > Cancer Support Group  2nd Tuesday of the month 1pm-2pm, Journey Room   > Creative Journey  3rd Tuesday of the month 1130am-1pm, Journey Room    

## 2017-06-03 ENCOUNTER — Encounter (HOSPITAL_COMMUNITY): Payer: Self-pay | Admitting: Emergency Medicine

## 2017-06-03 DIAGNOSIS — C9001 Multiple myeloma in remission: Secondary | ICD-10-CM

## 2017-06-03 MED ORDER — LENALIDOMIDE 15 MG PO CAPS: 15.0000 mg | ORAL_CAPSULE | Freq: Every day | ORAL | 0 refills | Status: DC

## 2017-06-12 ENCOUNTER — Other Ambulatory Visit: Payer: Self-pay

## 2017-06-12 ENCOUNTER — Emergency Department (HOSPITAL_COMMUNITY)
Admission: EM | Admit: 2017-06-12 | Discharge: 2017-06-12 | Disposition: A | Discharge status: Other Acute Inpatient Hospital | Payer: Medicare Other | Attending: Emergency Medicine | Admitting: Emergency Medicine

## 2017-06-12 ENCOUNTER — Encounter (HOSPITAL_COMMUNITY): Payer: Self-pay | Admitting: Emergency Medicine

## 2017-06-12 ENCOUNTER — Emergency Department (HOSPITAL_COMMUNITY): Payer: Medicare Other

## 2017-06-12 DIAGNOSIS — Z79899 Other long term (current) drug therapy: Secondary | ICD-10-CM | POA: Diagnosis not present

## 2017-06-12 DIAGNOSIS — R279 Unspecified lack of coordination: Secondary | ICD-10-CM | POA: Diagnosis not present

## 2017-06-12 DIAGNOSIS — J90 Pleural effusion, not elsewhere classified: Secondary | ICD-10-CM | POA: Diagnosis not present

## 2017-06-12 DIAGNOSIS — R739 Hyperglycemia, unspecified: Secondary | ICD-10-CM | POA: Diagnosis not present

## 2017-06-12 DIAGNOSIS — J181 Lobar pneumonia, unspecified organism: Secondary | ICD-10-CM | POA: Diagnosis not present

## 2017-06-12 DIAGNOSIS — R140 Abdominal distension (gaseous): Secondary | ICD-10-CM | POA: Diagnosis not present

## 2017-06-12 DIAGNOSIS — E785 Hyperlipidemia, unspecified: Secondary | ICD-10-CM | POA: Diagnosis not present

## 2017-06-12 DIAGNOSIS — D708 Other neutropenia: Secondary | ICD-10-CM | POA: Diagnosis not present

## 2017-06-12 DIAGNOSIS — A419 Sepsis, unspecified organism: Secondary | ICD-10-CM | POA: Diagnosis not present

## 2017-06-12 DIAGNOSIS — I951 Orthostatic hypotension: Secondary | ICD-10-CM | POA: Diagnosis not present

## 2017-06-12 DIAGNOSIS — T380X5A Adverse effect of glucocorticoids and synthetic analogues, initial encounter: Secondary | ICD-10-CM | POA: Diagnosis not present

## 2017-06-12 DIAGNOSIS — Z9484 Stem cells transplant status: Secondary | ICD-10-CM | POA: Diagnosis not present

## 2017-06-12 DIAGNOSIS — K1370 Unspecified lesions of oral mucosa: Secondary | ICD-10-CM | POA: Diagnosis not present

## 2017-06-12 DIAGNOSIS — J9811 Atelectasis: Secondary | ICD-10-CM | POA: Diagnosis not present

## 2017-06-12 DIAGNOSIS — Z8579 Personal history of other malignant neoplasms of lymphoid, hematopoietic and related tissues: Secondary | ICD-10-CM | POA: Diagnosis not present

## 2017-06-12 DIAGNOSIS — Z7984 Long term (current) use of oral hypoglycemic drugs: Secondary | ICD-10-CM | POA: Diagnosis not present

## 2017-06-12 DIAGNOSIS — E0965 Drug or chemical induced diabetes mellitus with hyperglycemia: Secondary | ICD-10-CM | POA: Diagnosis not present

## 2017-06-12 DIAGNOSIS — I10 Essential (primary) hypertension: Secondary | ICD-10-CM | POA: Diagnosis not present

## 2017-06-12 DIAGNOSIS — D696 Thrombocytopenia, unspecified: Secondary | ICD-10-CM | POA: Insufficient documentation

## 2017-06-12 DIAGNOSIS — Z794 Long term (current) use of insulin: Secondary | ICD-10-CM | POA: Diagnosis not present

## 2017-06-12 DIAGNOSIS — B9689 Other specified bacterial agents as the cause of diseases classified elsewhere: Secondary | ICD-10-CM | POA: Diagnosis not present

## 2017-06-12 DIAGNOSIS — Z7982 Long term (current) use of aspirin: Secondary | ICD-10-CM | POA: Insufficient documentation

## 2017-06-12 DIAGNOSIS — R7881 Bacteremia: Secondary | ICD-10-CM | POA: Diagnosis not present

## 2017-06-12 DIAGNOSIS — L89322 Pressure ulcer of left buttock, stage 2: Secondary | ICD-10-CM | POA: Diagnosis not present

## 2017-06-12 DIAGNOSIS — R601 Generalized edema: Secondary | ICD-10-CM | POA: Diagnosis not present

## 2017-06-12 DIAGNOSIS — E118 Type 2 diabetes mellitus with unspecified complications: Secondary | ICD-10-CM | POA: Diagnosis not present

## 2017-06-12 DIAGNOSIS — D709 Neutropenia, unspecified: Secondary | ICD-10-CM | POA: Diagnosis not present

## 2017-06-12 DIAGNOSIS — E119 Type 2 diabetes mellitus without complications: Secondary | ICD-10-CM | POA: Diagnosis not present

## 2017-06-12 DIAGNOSIS — Z743 Need for continuous supervision: Secondary | ICD-10-CM | POA: Diagnosis not present

## 2017-06-12 DIAGNOSIS — I4510 Unspecified right bundle-branch block: Secondary | ICD-10-CM | POA: Diagnosis not present

## 2017-06-12 DIAGNOSIS — K7689 Other specified diseases of liver: Secondary | ICD-10-CM | POA: Diagnosis not present

## 2017-06-12 DIAGNOSIS — D509 Iron deficiency anemia, unspecified: Secondary | ICD-10-CM | POA: Diagnosis not present

## 2017-06-12 DIAGNOSIS — R110 Nausea: Secondary | ICD-10-CM | POA: Diagnosis not present

## 2017-06-12 DIAGNOSIS — R220 Localized swelling, mass and lump, head: Secondary | ICD-10-CM | POA: Diagnosis not present

## 2017-06-12 DIAGNOSIS — M6281 Muscle weakness (generalized): Secondary | ICD-10-CM | POA: Diagnosis not present

## 2017-06-12 DIAGNOSIS — K140 Glossitis: Secondary | ICD-10-CM | POA: Diagnosis not present

## 2017-06-12 DIAGNOSIS — M25561 Pain in right knee: Secondary | ICD-10-CM | POA: Diagnosis not present

## 2017-06-12 DIAGNOSIS — D61810 Antineoplastic chemotherapy induced pancytopenia: Secondary | ICD-10-CM | POA: Diagnosis not present

## 2017-06-12 DIAGNOSIS — R000 Tachycardia, unspecified: Secondary | ICD-10-CM | POA: Diagnosis not present

## 2017-06-12 DIAGNOSIS — R197 Diarrhea, unspecified: Secondary | ICD-10-CM | POA: Diagnosis not present

## 2017-06-12 DIAGNOSIS — B952 Enterococcus as the cause of diseases classified elsewhere: Secondary | ICD-10-CM | POA: Diagnosis not present

## 2017-06-12 DIAGNOSIS — R55 Syncope and collapse: Secondary | ICD-10-CM | POA: Diagnosis not present

## 2017-06-12 DIAGNOSIS — C9000 Multiple myeloma not having achieved remission: Secondary | ICD-10-CM | POA: Diagnosis not present

## 2017-06-12 DIAGNOSIS — E1165 Type 2 diabetes mellitus with hyperglycemia: Secondary | ICD-10-CM | POA: Diagnosis not present

## 2017-06-12 DIAGNOSIS — R262 Difficulty in walking, not elsewhere classified: Secondary | ICD-10-CM | POA: Diagnosis not present

## 2017-06-12 DIAGNOSIS — R9431 Abnormal electrocardiogram [ECG] [EKG]: Secondary | ICD-10-CM | POA: Diagnosis not present

## 2017-06-12 DIAGNOSIS — R0600 Dyspnea, unspecified: Secondary | ICD-10-CM | POA: Diagnosis not present

## 2017-06-12 DIAGNOSIS — T451X5A Adverse effect of antineoplastic and immunosuppressive drugs, initial encounter: Secondary | ICD-10-CM | POA: Diagnosis present

## 2017-06-12 DIAGNOSIS — D6959 Other secondary thrombocytopenia: Secondary | ICD-10-CM | POA: Diagnosis not present

## 2017-06-12 DIAGNOSIS — E8770 Fluid overload, unspecified: Secondary | ICD-10-CM | POA: Diagnosis not present

## 2017-06-12 DIAGNOSIS — R5081 Fever presenting with conditions classified elsewhere: Secondary | ICD-10-CM | POA: Diagnosis not present

## 2017-06-12 DIAGNOSIS — R918 Other nonspecific abnormal finding of lung field: Secondary | ICD-10-CM | POA: Diagnosis not present

## 2017-06-12 DIAGNOSIS — K149 Disease of tongue, unspecified: Secondary | ICD-10-CM | POA: Diagnosis not present

## 2017-06-12 DIAGNOSIS — R740 Nonspecific elevation of levels of transaminase and lactic acid dehydrogenase [LDH]: Secondary | ICD-10-CM | POA: Diagnosis not present

## 2017-06-12 DIAGNOSIS — A4181 Sepsis due to Enterococcus: Secondary | ICD-10-CM | POA: Diagnosis not present

## 2017-06-12 DIAGNOSIS — C9100 Acute lymphoblastic leukemia not having achieved remission: Secondary | ICD-10-CM | POA: Diagnosis not present

## 2017-06-12 DIAGNOSIS — D61818 Other pancytopenia: Secondary | ICD-10-CM | POA: Diagnosis not present

## 2017-06-12 DIAGNOSIS — C9001 Multiple myeloma in remission: Secondary | ICD-10-CM | POA: Diagnosis not present

## 2017-06-12 DIAGNOSIS — L89312 Pressure ulcer of right buttock, stage 2: Secondary | ICD-10-CM | POA: Diagnosis not present

## 2017-06-12 DIAGNOSIS — K148 Other diseases of tongue: Secondary | ICD-10-CM | POA: Diagnosis not present

## 2017-06-12 DIAGNOSIS — E875 Hyperkalemia: Secondary | ICD-10-CM | POA: Diagnosis not present

## 2017-06-12 DIAGNOSIS — Z95828 Presence of other vascular implants and grafts: Secondary | ICD-10-CM | POA: Diagnosis not present

## 2017-06-12 DIAGNOSIS — D72829 Elevated white blood cell count, unspecified: Secondary | ICD-10-CM | POA: Diagnosis not present

## 2017-06-12 DIAGNOSIS — R188 Other ascites: Secondary | ICD-10-CM | POA: Diagnosis not present

## 2017-06-12 LAB — CBC WITH DIFFERENTIAL/PLATELET
BLASTS: 5 %
Band Neutrophils: 0 %
Basophils Absolute: 0 10*3/uL (ref 0.0–0.1)
Basophils Relative: 0 %
Eosinophils Absolute: 0.2 10*3/uL (ref 0.0–0.7)
Eosinophils Relative: 1 %
HCT: 26.3 % — ABNORMAL LOW (ref 39.0–52.0)
HEMOGLOBIN: 9.1 g/dL — AB (ref 13.0–17.0)
LYMPHS PCT: 28 %
Lymphs Abs: 4.7 10*3/uL — ABNORMAL HIGH (ref 0.7–4.0)
MCH: 35.1 pg — AB (ref 26.0–34.0)
MCHC: 34.6 g/dL (ref 30.0–36.0)
MCV: 101.5 fL — AB (ref 78.0–100.0)
Metamyelocytes Relative: 0 %
Monocytes Absolute: 0.5 10*3/uL (ref 0.1–1.0)
Monocytes Relative: 3 %
Myelocytes: 0 %
NEUTROS PCT: 2 %
NRBC: 0 /100{WBCs}
Neutro Abs: 0.3 10*3/uL — ABNORMAL LOW (ref 1.7–7.7)
OTHER: 61 %
PROMYELOCYTES RELATIVE: 0 %
Platelets: 7 10*3/uL — CL (ref 150–400)
RBC: 2.59 MIL/uL — ABNORMAL LOW (ref 4.22–5.81)
RDW: 17.8 % — ABNORMAL HIGH (ref 11.5–15.5)
WBC: 16.7 10*3/uL — ABNORMAL HIGH (ref 4.0–10.5)

## 2017-06-12 LAB — COMPREHENSIVE METABOLIC PANEL
ALK PHOS: 105 U/L (ref 38–126)
ALT: 38 U/L (ref 17–63)
ANION GAP: 11 (ref 5–15)
AST: 47 U/L — ABNORMAL HIGH (ref 15–41)
Albumin: 3.3 g/dL — ABNORMAL LOW (ref 3.5–5.0)
BILIRUBIN TOTAL: 0.9 mg/dL (ref 0.3–1.2)
BUN: 18 mg/dL (ref 6–20)
CALCIUM: 8.3 mg/dL — AB (ref 8.9–10.3)
CO2: 18 mmol/L — ABNORMAL LOW (ref 22–32)
Chloride: 103 mmol/L (ref 101–111)
Creatinine, Ser: 1.25 mg/dL — ABNORMAL HIGH (ref 0.61–1.24)
GFR calc non Af Amer: >60 mL/min (ref >60)
Glucose, Bld: 266 mg/dL — ABNORMAL HIGH (ref 65–99)
Potassium: 3.2 mmol/L — ABNORMAL LOW (ref 3.5–5.1)
SODIUM: 132 mmol/L — AB (ref 135–145)
TOTAL PROTEIN: 7.3 g/dL (ref 6.5–8.1)

## 2017-06-12 LAB — I-STAT TROPONIN, ED: TROPONIN I, POC: 0 ng/mL (ref 0.00–0.08)

## 2017-06-12 LAB — CBG MONITORING, ED: Glucose-Capillary: 261 mg/dL — ABNORMAL HIGH (ref 65–99)

## 2017-06-12 MED ORDER — SODIUM CHLORIDE 0.9 % IV BOLUS
1000.0000 mL | Freq: Once | INTRAVENOUS | Status: AC
  Administered 2017-06-12: 1000 mL via INTRAVENOUS

## 2017-06-12 NOTE — ED Notes (Signed)
cbg 261

## 2017-06-12 NOTE — ED Triage Notes (Signed)
Pt states abscess since Sunday. Swelling, white in nature.

## 2017-06-12 NOTE — ED Notes (Addendum)
CRITICAL VALUE ALERT  Critical Value:  Platelets 7  Date & Time Notied:  06/12/17 1135  Provider Notified: Melina Schools, PA  Orders Received/Actions taken: EDP notified, no further orders given

## 2017-06-12 NOTE — ED Notes (Addendum)
Pt became unresponsive and pulseless during triage per triage RN. Pt was very pale and diaphoretic in triage. Pt now alert and oriented x 4.

## 2017-06-12 NOTE — ED Notes (Signed)
Pt sitting in triage chair, RN taking BP again as it read 03K systolic. Pt went unresponsive, head tilting back, pupils dilated. Sternal rub with no response, no pulse felt. Code Blue button pulled. approx 30seconds later patient came to, diaphoretic and asking what happened. Pt transported to acute Room 2

## 2017-06-12 NOTE — ED Provider Notes (Signed)
[facility] EMERGENCY DEPARTMENT Provider Note   CSN: [id number] Arrival date & time: 06/12/17  0848     History   Chief Complaint Chief Complaint  John Singleton presents with  . Oral Swelling    abscess?  Marland Kitchen Loss of Consciousness    HPI John Singleton is a 57 y.o. male.  HPI 57 year old Caucasian male past medical history significant for hypertension, diabetes, B12 deficiency, multiple myeloma in remission, stem cell transplant and 2014 presents to the emergency department today for evaluation of cyst to his tongue.  John Singleton reports there has been a painful area to his tongue for the past 3 days.  He has been using hydroperoxide over-the-counter to help with the symptoms.  Reports area is white in nature.  Tender to palpation.  Difficult to eat or drink secondary to the pain.  John Singleton reports no history of this.  Denies any difficulties breathing or swallowing.  John Singleton has not taken anything for symptoms prior to arrival.  John Singleton states that he did bite his tongue several days ago.  Of note CODE BLUE was called while John Singleton was in triage.  He had apparent syncopal episode.  Nurse reports that John Singleton's blood pressure was 80 systolic.  He went unresponsive, head tilting back, pupils dilated.  He was not responsive to sternal rub.  Approximate 30 seconds later the John Singleton came to.  Shunt was unaware of the syncopal episode.  States he has had no prior episodes of syncope.  John Singleton denied any chest pain, shortness of breath, lightheadedness or dizziness.  John Singleton denied any headache or vision changes.  States he feels at his baseline currently except for the cyst to his tongue.  John Singleton reports history of multiple myeloma.  He was a recipient after stem cell transplant in 2014.  He is followed by oncology/hematology.  John Singleton reports that he had a office visit 2 months ago and they state that he was in remission at that time.  I did review John Singleton's lab work.  Platelets at that time was 92.  They  reported chronic pancytopenia that they felt secondary to medications.  Hgb was 12.2.  White count was 2.3.  Pt denies any fever, chill, ha, vision changes, lightheadedness, dizziness, congestion, neck pain, cp, sob, cough, abd pain, n/v/d, urinary symptoms, change in bowel habits, melena, hematochezia, lower extremity paresthesias.   Past Medical History:  Diagnosis Date  . B12 deficiency 10/23/2015  . Cancer ([place])   . Diabetes mellitus without complication ([place])   . HTN (hypertension)   . Multiple myeloma ([place])   . Multiple myeloma in remission ([place]) 07/30/2015  . Pneumonia   . Sepsis(995.91)   . Stem cells transplant status [facility])     John Singleton Active Problem List   Diagnosis Date Noted  . B12 deficiency 10/23/2015  . Multiple myeloma in remission ([place]) 07/30/2015  . DM2 (diabetes mellitus, type 2) ([place]) 07/30/2015  . Thrombocytopenia ([place]) 07/30/2015  . Leucopenia 07/30/2015  . Anemia 07/30/2015  . HTN (hypertension)   . Allergic transfusion reaction 08/18/2012  . H/O stem cell transplant ([place]) 08/07/2012    Past Surgical History:  Procedure Laterality Date  . LIMBAL STEM CELL TRANSPLANT    . NOSE SURGERY     "when I was a child"        Home Medications    Prior to Admission medications   Medication Sig Start Date End Date Taking? Authorizing Provider  aspirin 325 MG EC tablet Take 325 mg by mouth daily.  [provider]  Calcium Carbonate-Vitamin D 600-400 MG-UNIT tablet Take 1 tablet by mouth 2 (two) times daily.    [provider]  cholecalciferol (VITAMIN D) 1000 UNITS tablet Take 1,000 Units by mouth daily.    [provider]  lenalidomide (REVLIMID) 15 MG capsule Take 1 capsule (15 mg total) by mouth daily. 06/03/17   Derek Jack, MD  magnesium oxide (MAG-OX) 400 MG tablet Take 400 mg by mouth daily.    [provider]  metFORMIN (GLUCOPHAGE) 500 MG tablet Take 1 tablet (500 mg total) by mouth 2 (two) times daily with  a meal. John Singleton taking differently: Take 1,000 mg by mouth 2 (two) times daily with a meal.  05/17/11 05/27/17  Nat Christen, MD  Multiple Vitamin (MULTIVITAMIN WITH MINERALS) TABS tablet Take 1 tablet by mouth daily.    [provider]  ramipril (ALTACE) 5 MG capsule Take 5 mg by mouth daily.    [provider]  simvastatin (ZOCOR) 20 MG tablet Take 20 mg by mouth at bedtime.    [provider]    Family History History reviewed. No pertinent family history.  Social History Social History   Tobacco Use  . Smoking status: Never Smoker  . Smokeless tobacco: Never Used  Substance Use Topics  . Alcohol use: No    Comment: occasional  . Drug use: No     Allergies   John Singleton has no known allergies.   Review of Systems Review of Systems  All other systems reviewed and are negative.    Physical Exam Updated Vital Signs BP 117/77   Pulse 90   Temp 98.8 F (37.1 C) (Oral)   Resp (!) 25   Wt 97.5 kg (215 lb)   SpO2 98%   BMI 32.69 kg/m   Physical Exam  Constitutional: He is oriented to person, place, and time. He appears well-developed and well-nourished.  Non-toxic appearance. No distress.  John Singleton is pale appearing.  HENT:  Head: Normocephalic and atraumatic.  Nose: Nose normal.  Mouth/Throat: Oropharynx is clear and moist.  John Singleton has a placement 2 to 3 cm cystic structure to the left side of the tongue.  This is mildly tender to palpation.  Small white lesion noted left side of the tongue.  There is no erythema or warmth.  No purulent drainage.  No ecchymosis noted.  Posterior oropharynx is clear.  Eyes: Pupils are equal, round, and reactive to light. Conjunctivae are normal. Right eye exhibits no discharge. Left eye exhibits no discharge.  Neck: Normal range of motion. Neck supple. No JVD present. No tracheal deviation present.  John Singleton has full range of motion of the neck.  Cardiovascular: Normal rate, regular rhythm, normal heart sounds and  intact distal pulses. Exam reveals no gallop and no friction rub.  No murmur heard. Pulmonary/Chest: Effort normal and breath sounds normal. No stridor. No respiratory distress. He has no wheezes. He has no rales. He exhibits no tenderness.  No hypoxia or tachypnea.  Abdominal: Soft. Bowel sounds are normal. He exhibits no distension. There is no tenderness. There is no rebound and no guarding.  Musculoskeletal: Normal range of motion.  No lower extremity edema or calf tenderness.  Lymphadenopathy:    He has no cervical adenopathy.  Neurological: He is alert and oriented to person, place, and time.  The John Singleton is alert, attentive, and oriented x 3. Speech is clear. Cranial nerve II-VII grossly intact. Negative pronator drift. Sensation intact. Strength 5/5 in all extremities. Reflexes 2+  and symmetric at biceps, triceps, knees, and ankles. Rapid alternating movement and fine finger movements intact.    Skin: Skin is warm and dry. Capillary refill takes less than 2 seconds. He is not diaphoretic.  Psychiatric: His behavior is normal. Judgment and thought content normal.  Nursing note and vitals reviewed.    ED Treatments / Results  Labs (all labs ordered are listed, but only abnormal results are displayed) Labs Reviewed  COMPREHENSIVE METABOLIC PANEL - Abnormal; Notable for the following components:      Result Value   Sodium 132 (*)    Potassium 3.2 (*)    CO2 18 (*)    Glucose, Bld 266 (*)    Creatinine, Ser 1.25 (*)    Calcium 8.3 (*)    Albumin 3.3 (*)    AST 47 (*)    All other components within normal limits  CBC WITH DIFFERENTIAL/PLATELET - Abnormal; Notable for the following components:   WBC 16.7 (*)    RBC 2.59 (*)    Hemoglobin 9.1 (*)    HCT 26.3 (*)    MCV 101.5 (*)    MCH 35.1 (*)    RDW 17.8 (*)    Platelets 7 (*)    Neutro Abs 0.3 (*)    Lymphs Abs 4.7 (*)    All other components within normal limits  CBG MONITORING, ED - Abnormal; Notable for the  following components:   Glucose-Capillary 261 (*)    All other components within normal limits  PATHOLOGIST SMEAR REVIEW  I-STAT TROPONIN, ED    EKG EKG Interpretation  Date/Time:  Wednesday Jun 12 2017 09:15:08 EDT Ventricular Rate:  100 PR Interval:    QRS Duration: 136 QT Interval:  395 QTC Calculation: 510 R Axis:   71 Text Interpretation:  Sinus tachycardia Multiple ventricular premature complexes Right bundle branch block Baseline wander No old tracing to compare Confirmed by Francine Graven [phone]) on 06/12/2017 12:00:40 PM   Radiology Dg Chest 2 View  Result Date: 06/12/2017 CLINICAL DATA:  Syncope, history multiple myeloma, prior stem-cell transplant, diabetes mellitus, hypertension EXAM: CHEST - 2 VIEW COMPARISON:  02/19/2017 FINDINGS: Lordotic positioning. Normal heart size, mediastinal contours, and pulmonary vascularity. Lungs clear. No pleural effusion or pneumothorax. Bones demineralized with scattered degenerative disc disease changes thoracic spine. IMPRESSION: No acute abnormalities. Electronically Signed   By: Lavonia Dana M.D.   On: 06/12/2017 11:09    Procedures Procedures (including critical care time)  Medications Ordered in ED Medications  sodium chloride 0.9 % bolus 1,000 mL (1,000 mLs Intravenous New Bag/Given 06/12/17 1130)     Initial Impression / Assessment and Plan / ED Course  I have reviewed the triage vital signs and the nursing notes.  Pertinent labs & imaging results that were available during my care of the John Singleton were reviewed by me and considered in my medical decision making (see chart for details).     John Singleton initially presented to the ED for evaluation of cyst like structure to the left side of the tongue.  While in triage John Singleton had an apparent syncopal episode and was out for 30 seconds.  John Singleton was also hypotensive at that time with blood pressures of 80 systolic.  John Singleton regained consciousness and was back to his baseline per  John Singleton.  John Singleton does have history of multiple myeloma in remission.  Recent lab work was reviewed.  On my examination John Singleton vital signs are reassuring.  There is no tachycardia, hypotension or fever noted.  John Singleton does  have cystlike structure to the left side of his tongue.  There is no purulent drainage.  There is no erythema noted.  Mildly tender to palpation.  Oropharynx is clear.  Managing secretions tolerating his airway.  Heart regular rate and rhythm.  Lungs clear to auscultation bilaterally.  No focal abdominal tenderness.  Neurovascular intact in all extremity's.  No focal neuro deficit noted on my examination.  Lab notified me concerning John Singleton's CBC.  White count is 16,000.  Hemoglobin is 9.1.  Platelets at 7.  These were compared to John Singleton's prior 2 months ago.  Hemoglobin has decreased.  Increase in the white blood cell count.  Platelets have significantly decreased from a 90 to 7.  John Singleton's electrolytes seem consistent with dehydration.  Mild hyponatremia 132.  Mild hypokalemia at 3.2.  Bicarb is 18.  Normal gap.  Creatinine mildly elevated at 1.25.  Negative troponin.  Lab ascending for pathological smear review they state that the WBCs were concerning.  Chest imaging showed no acute abnormalities.  EKG shows sinus tachycardia right bundle branch block.  John Singleton also had PVCs.  No old tracing to compare.  John Singleton actively denies chest pain or shortness of breath.  John Singleton has mildly orthostatic.  John Singleton given fluids.  This is likely secondary to dehydration.  John Singleton [facility] syncope rule score is 2.  John Singleton is not a low risk group for serious outcome.  I do not feel the John Singleton would benefit from hospital admission.  Will consult hospital medicine for admission with hematology oncology consultation.  With hospital team at our facility who states that John Singleton would likely benefit from bone marrow aspiration.  They recommended that I contact hematology oncology and possible  transfer to [facility].  I spoke with Dr. Delton Coombes.  He felt that John Singleton would benefit from transfer to leukemia center.   I spoke with Dr. Florene Glen at [facility] health with the heme-onc team there.  He is accepted John Singleton in transfer.  John Singleton notified and informed of plan and is comfortable transfer to [facility].  John Singleton remains hemodynamically stable this time.  Awaiting bed assignment and transfer.  EMTALA will be filled out by attending physician.  Final Clinical Impressions(s) / ED Diagnoses   Final diagnoses:  Syncope and collapse  Thrombocytopenia ([place])  Lesion of tongue    ED Discharge Orders    None       Doristine Devoid, PA-C 06/12/17 [place], [place], DO 06/17/17 [phone]

## 2017-06-13 DIAGNOSIS — D61818 Other pancytopenia: Secondary | ICD-10-CM | POA: Insufficient documentation

## 2017-06-13 DIAGNOSIS — C9100 Acute lymphoblastic leukemia not having achieved remission: Secondary | ICD-10-CM | POA: Insufficient documentation

## 2017-06-13 LAB — PATHOLOGIST SMEAR REVIEW

## 2017-06-14 MED ORDER — SODIUM CHLORIDE 0.9 % IJ SOLN
20.00 | INTRAMUSCULAR | Status: DC
Start: ? — End: 2017-06-14

## 2017-06-14 MED ORDER — CALCIUM-VITAMIN D3 250-125 MG-UNIT PO TABS
2.00 | ORAL_TABLET | ORAL | Status: DC
Start: 2017-06-14 — End: 2017-06-14

## 2017-06-14 MED ORDER — SODIUM CHLORIDE 0.9 % IJ SOLN
20.00 | INTRAMUSCULAR | Status: DC
Start: 2017-07-31 — End: 2017-06-14

## 2017-06-14 MED ORDER — GENERIC EXTERNAL MEDICATION
1.00 | Status: DC
Start: 2017-06-14 — End: 2017-06-14

## 2017-06-14 MED ORDER — ACYCLOVIR 800 MG PO TABS
800.00 | ORAL_TABLET | ORAL | Status: DC
Start: 2017-06-14 — End: 2017-06-14

## 2017-06-14 MED ORDER — MAGNESIUM OXIDE 400 MG PO TABS
400.00 | ORAL_TABLET | ORAL | Status: DC
Start: 2017-06-14 — End: 2017-06-14

## 2017-06-14 MED ORDER — CHOLECALCIFEROL 25 MCG (1000 UT) PO TABS
1000.00 | ORAL_TABLET | ORAL | Status: DC
Start: 2017-06-14 — End: 2017-06-14

## 2017-06-14 MED ORDER — ONDANSETRON HCL 4 MG/2ML IJ SOLN
4.00 | INTRAMUSCULAR | Status: DC
Start: ? — End: 2017-06-14

## 2017-06-14 MED ORDER — SODIUM CHLORIDE 0.9 % IV SOLN
250.00 | INTRAVENOUS | Status: DC
Start: ? — End: 2017-06-14

## 2017-06-14 MED ORDER — FLUCONAZOLE 200 MG PO TABS
400.00 | ORAL_TABLET | ORAL | Status: DC
Start: 2017-06-14 — End: 2017-06-14

## 2017-06-14 MED ORDER — DIAZEPAM 5 MG PO TABS
5.00 | ORAL_TABLET | ORAL | Status: DC
Start: ? — End: 2017-06-14

## 2017-06-14 MED ORDER — GENERIC EXTERNAL MEDICATION
2.00 g | Status: DC
Start: 2017-06-14 — End: 2017-06-14

## 2017-06-27 ENCOUNTER — Inpatient Hospital Stay (HOSPITAL_COMMUNITY): Payer: Medicare Other | Attending: Oncology

## 2017-07-26 ENCOUNTER — Ambulatory Visit (HOSPITAL_COMMUNITY): Payer: Medicare Other

## 2017-07-30 ENCOUNTER — Non-Acute Institutional Stay (SKILLED_NURSING_FACILITY): Payer: Medicare Other | Admitting: Internal Medicine

## 2017-07-30 ENCOUNTER — Encounter: Payer: Self-pay | Admitting: Internal Medicine

## 2017-07-30 ENCOUNTER — Other Ambulatory Visit (HOSPITAL_COMMUNITY): Payer: Self-pay | Admitting: *Deleted

## 2017-07-30 ENCOUNTER — Encounter (HOSPITAL_COMMUNITY): Payer: Self-pay | Admitting: *Deleted

## 2017-07-30 ENCOUNTER — Inpatient Hospital Stay
Admission: RE | Admit: 2017-07-30 | Discharge: 2017-08-18 | Disposition: A | Discharge status: Home / Self Care | Payer: Medicare Other | Source: Ambulatory Visit | Attending: Internal Medicine | Admitting: Internal Medicine

## 2017-07-30 DIAGNOSIS — R601 Generalized edema: Secondary | ICD-10-CM | POA: Diagnosis not present

## 2017-07-30 DIAGNOSIS — R739 Hyperglycemia, unspecified: Secondary | ICD-10-CM | POA: Diagnosis not present

## 2017-07-30 DIAGNOSIS — Z9229 Personal history of other drug therapy: Secondary | ICD-10-CM | POA: Diagnosis not present

## 2017-07-30 DIAGNOSIS — E1165 Type 2 diabetes mellitus with hyperglycemia: Secondary | ICD-10-CM | POA: Diagnosis not present

## 2017-07-30 DIAGNOSIS — R740 Nonspecific elevation of levels of transaminase and lactic acid dehydrogenase [LDH]: Secondary | ICD-10-CM | POA: Diagnosis not present

## 2017-07-30 DIAGNOSIS — I10 Essential (primary) hypertension: Secondary | ICD-10-CM | POA: Diagnosis not present

## 2017-07-30 DIAGNOSIS — Z794 Long term (current) use of insulin: Secondary | ICD-10-CM | POA: Diagnosis not present

## 2017-07-30 DIAGNOSIS — R7881 Bacteremia: Secondary | ICD-10-CM | POA: Diagnosis not present

## 2017-07-30 DIAGNOSIS — R262 Difficulty in walking, not elsewhere classified: Secondary | ICD-10-CM | POA: Diagnosis not present

## 2017-07-30 DIAGNOSIS — M6281 Muscle weakness (generalized): Secondary | ICD-10-CM | POA: Diagnosis not present

## 2017-07-30 DIAGNOSIS — D61818 Other pancytopenia: Secondary | ICD-10-CM

## 2017-07-30 DIAGNOSIS — D6481 Anemia due to antineoplastic chemotherapy: Secondary | ICD-10-CM | POA: Diagnosis not present

## 2017-07-30 DIAGNOSIS — D61810 Antineoplastic chemotherapy induced pancytopenia: Secondary | ICD-10-CM | POA: Diagnosis not present

## 2017-07-30 DIAGNOSIS — I951 Orthostatic hypotension: Secondary | ICD-10-CM

## 2017-07-30 DIAGNOSIS — R5381 Other malaise: Secondary | ICD-10-CM | POA: Diagnosis not present

## 2017-07-30 DIAGNOSIS — Q998 Other specified chromosome abnormalities: Secondary | ICD-10-CM | POA: Diagnosis not present

## 2017-07-30 DIAGNOSIS — R918 Other nonspecific abnormal finding of lung field: Secondary | ICD-10-CM | POA: Diagnosis not present

## 2017-07-30 DIAGNOSIS — Z95828 Presence of other vascular implants and grafts: Secondary | ICD-10-CM | POA: Diagnosis not present

## 2017-07-30 DIAGNOSIS — E118 Type 2 diabetes mellitus with unspecified complications: Secondary | ICD-10-CM

## 2017-07-30 DIAGNOSIS — K148 Other diseases of tongue: Secondary | ICD-10-CM | POA: Diagnosis not present

## 2017-07-30 DIAGNOSIS — R188 Other ascites: Secondary | ICD-10-CM | POA: Diagnosis not present

## 2017-07-30 DIAGNOSIS — D709 Neutropenia, unspecified: Secondary | ICD-10-CM | POA: Diagnosis not present

## 2017-07-30 DIAGNOSIS — Z862 Personal history of diseases of the blood and blood-forming organs and certain disorders involving the immune mechanism: Secondary | ICD-10-CM | POA: Diagnosis not present

## 2017-07-30 DIAGNOSIS — C9001 Multiple myeloma in remission: Secondary | ICD-10-CM | POA: Diagnosis not present

## 2017-07-30 DIAGNOSIS — R5081 Fever presenting with conditions classified elsewhere: Secondary | ICD-10-CM | POA: Diagnosis not present

## 2017-07-30 DIAGNOSIS — R21 Rash and other nonspecific skin eruption: Secondary | ICD-10-CM | POA: Diagnosis not present

## 2017-07-30 DIAGNOSIS — B952 Enterococcus as the cause of diseases classified elsewhere: Secondary | ICD-10-CM | POA: Diagnosis not present

## 2017-07-30 DIAGNOSIS — C9000 Multiple myeloma not having achieved remission: Secondary | ICD-10-CM | POA: Diagnosis not present

## 2017-07-30 DIAGNOSIS — T451X5A Adverse effect of antineoplastic and immunosuppressive drugs, initial encounter: Secondary | ICD-10-CM | POA: Diagnosis not present

## 2017-07-30 DIAGNOSIS — Z792 Long term (current) use of antibiotics: Secondary | ICD-10-CM | POA: Diagnosis not present

## 2017-07-30 DIAGNOSIS — C9100 Acute lymphoblastic leukemia not having achieved remission: Secondary | ICD-10-CM

## 2017-07-30 DIAGNOSIS — Z79899 Other long term (current) drug therapy: Secondary | ICD-10-CM | POA: Diagnosis not present

## 2017-07-30 DIAGNOSIS — Z743 Need for continuous supervision: Secondary | ICD-10-CM | POA: Diagnosis not present

## 2017-07-30 DIAGNOSIS — E785 Hyperlipidemia, unspecified: Secondary | ICD-10-CM | POA: Diagnosis not present

## 2017-07-30 DIAGNOSIS — E0965 Drug or chemical induced diabetes mellitus with hyperglycemia: Secondary | ICD-10-CM | POA: Diagnosis not present

## 2017-07-30 DIAGNOSIS — R279 Unspecified lack of coordination: Secondary | ICD-10-CM | POA: Diagnosis not present

## 2017-07-30 DIAGNOSIS — J90 Pleural effusion, not elsewhere classified: Secondary | ICD-10-CM | POA: Diagnosis not present

## 2017-07-30 DIAGNOSIS — T380X5A Adverse effect of glucocorticoids and synthetic analogues, initial encounter: Secondary | ICD-10-CM | POA: Diagnosis not present

## 2017-07-30 DIAGNOSIS — C9101 Acute lymphoblastic leukemia, in remission: Secondary | ICD-10-CM | POA: Diagnosis not present

## 2017-07-30 DIAGNOSIS — R000 Tachycardia, unspecified: Secondary | ICD-10-CM | POA: Diagnosis not present

## 2017-07-30 DIAGNOSIS — Z9484 Stem cells transplant status: Secondary | ICD-10-CM | POA: Diagnosis not present

## 2017-07-30 MED ORDER — SODIUM CHLORIDE 0.9 % IJ SOLN
20.00 | INTRAMUSCULAR | Status: DC
Start: ? — End: 2017-07-30

## 2017-07-30 MED ORDER — GENERIC EXTERNAL MEDICATION
5.00 | Status: DC
Start: ? — End: 2017-07-30

## 2017-07-30 MED ORDER — CALCIUM CARBONATE ANTACID 750 MG PO CHEW
750.00 | CHEWABLE_TABLET | ORAL | Status: DC
Start: ? — End: 2017-07-30

## 2017-07-30 MED ORDER — INSULIN LISPRO 100 UNIT/ML ~~LOC~~ SOLN
4.00 | SUBCUTANEOUS | Status: DC
Start: 2017-07-30 — End: 2017-07-30

## 2017-07-30 MED ORDER — SENNOSIDES-DOCUSATE SODIUM 8.6-50 MG PO TABS
1.00 | ORAL_TABLET | ORAL | Status: DC
Start: 2017-07-30 — End: 2017-07-30

## 2017-07-30 MED ORDER — GENERIC EXTERNAL MEDICATION
20.00 | Status: DC
Start: 2017-07-30 — End: 2017-07-30

## 2017-07-30 MED ORDER — GENERIC EXTERNAL MEDICATION
1.00 | Status: DC
Start: ? — End: 2017-07-30

## 2017-07-30 MED ORDER — GENERIC EXTERNAL MEDICATION
80.00 | Status: DC
Start: ? — End: 2017-07-30

## 2017-07-30 MED ORDER — ONDANSETRON 8 MG PO TBDP
8.00 | ORAL_TABLET | ORAL | Status: DC
Start: ? — End: 2017-07-30

## 2017-07-30 MED ORDER — OXYMETAZOLINE HCL 0.05 % NA SOLN
2.00 | NASAL | Status: DC
Start: ? — End: 2017-07-30

## 2017-07-30 MED ORDER — DEXTROSE 10 % IV SOLN
125.00 | INTRAVENOUS | Status: DC
Start: ? — End: 2017-07-30

## 2017-07-30 MED ORDER — METOPROLOL TARTRATE 25 MG PO TABS
12.50 | ORAL_TABLET | ORAL | Status: DC
Start: 2017-07-30 — End: 2017-07-30

## 2017-07-30 MED ORDER — ALBUTEROL SULFATE (2.5 MG/3ML) 0.083% IN NEBU
2.50 | INHALATION_SOLUTION | RESPIRATORY_TRACT | Status: DC
Start: ? — End: 2017-07-30

## 2017-07-30 MED ORDER — MIDODRINE HCL 5 MG PO TABS
5.00 | ORAL_TABLET | ORAL | Status: DC
Start: 2017-07-30 — End: 2017-07-30

## 2017-07-30 MED ORDER — SODIUM CHLORIDE 0.9 % IJ SOLN
20.00 | INTRAMUSCULAR | Status: DC
Start: 2017-07-31 — End: 2017-07-30

## 2017-07-30 NOTE — Progress Notes (Signed)
Provider: Veleta Miners MD  Location:   [place] Room Number: 129/P Place of Service:  SNF (31)  PCP: [name], MD Patient Care Team: [name], MD as PCP - General (Internal Medicine)  Extended Emergency Contact Information Primary Emergency Contact: Janann August, [place] [address] Johnnette Litter of [place] Phone: [phone] Mobile Phone: [phone] Relation: Brother Secondary Emergency Contact: Carlynn Purl, [place] [address] Johnnette Litter of [place] Phone: [phone] Relation: Brother  Code Status: Full Code Goals of Care: Advanced Directive information Advanced Directives 07/30/2017  Does Patient Have a Medical Advance Directive? Yes  Type of Advance Directive (No Data)  Does patient want to make changes to medical advance directive? No - Patient declined  Would patient like information on creating a medical advance directive? -      Chief Complaint  Patient presents with  . New Admit To SNF    Patient is being seen for New Admission Visit    HPI: Patient is a 57 y.o. male seen today for admission to SNF for therapy.  Patient was admitted to the [facility] From 05/22 - 07/09 .  Patient has a history of IgG With Kappa, Multiple Myeloma S/P Autologous SCT on Lenalidomide, DM type 2 And HTN.  He was admitted in the [facility] after having episode of Syncope in the ED when he was waiting to get evaluated for Tongue Lesion.His Lab Work also showed Abnormal Blast and Plasma cells. Bone Marrow Biopsy revealed Acute B Cell ALL and he was started on Chemotherapy.  He did also have Neutropenic Fever. Blood Culture were Positive for Vancomycin Sensitive Enterococcus which was treated With IV Vanco and then Po  Moxifloxacin. He is now discharged on Acyclovir and Bactrim. He also had issues with Anasarca thought to be due to Hypoalbuminemia. He underwent Paracentesis and Left Thoracocentesis with no return of his  symptoms.he was not discharged on Diuretics. Tongue Lesion on his  tongue was biopsied and was negative for any Malignancy. He also had micronodules new in [facility].  Which needs follow-up in 3 to 4 months. Patient also had some issues with orthostatic hypertension due to hypo-albuminemia Controlled with Midodrine. He also had work-up for his right knee pain.X-ray showed no acute fracture.  Synovial fluid collected was unremarkable. He also had hyperglycemia and was started on Lantus.  His metformin was discontinued. His lisinopril and Crestor  was discontinued due to hypotension and Transaminates.  Due to his prolonged hospitalization patient was very weak and was unable to walk without assist.  He is now in SNF for therapy.  He will follow as outpatient with his oncology for further therapy chemotherapy. Patient lives alone but has lot of family support.  He plans to go home once he is more independent Past Medical History:  Diagnosis Date  . B12 deficiency 10/23/2015  . Cancer ([place])   . Diabetes mellitus without complication ([place])   . HTN (hypertension)   . Multiple myeloma ([place])   . Multiple myeloma in remission ([place]) 07/30/2015  . Pneumonia   . Sepsis(995.91)   . Stem cells transplant status [facility])    Past Surgical History:  Procedure Laterality Date  . LIMBAL STEM CELL TRANSPLANT    . NOSE SURGERY     "when I was a child"    reports that he has never smoked. He has never used smokeless tobacco. He reports that  he does not drink alcohol or use drugs. Social History   Socioeconomic History  . Marital status: Divorced    Spouse name: Not on file  . Number of children: Not on file  . Years of education: Not on file  . Highest education level: Not on file  Occupational History  . Not on file  Social Needs  . Financial resource strain: Not on file  . Food insecurity:    Worry: Not on file    Inability: Not on file  . Transportation needs:    Medical: Not on file    Non-medical:  Not on file  Tobacco Use  . Smoking status: Never Smoker  . Smokeless tobacco: Never Used  Substance and Sexual Activity  . Alcohol use: No    Comment: occasional  . Drug use: No  . Sexual activity: Yes    Birth control/protection: None  Lifestyle  . Physical activity:    Days per week: Not on file    Minutes per session: Not on file  . Stress: Not on file  Relationships  . Social connections:    Talks on phone: Not on file    Gets together: Not on file    Attends religious service: Not on file    Active member of club or organization: Not on file    Attends meetings of clubs or organizations: Not on file    Relationship status: Not on file  . Intimate partner violence:    Fear of current or ex partner: Not on file    Emotionally abused: Not on file    Physically abused: Not on file    Forced sexual activity: Not on file  Other Topics Concern  . Not on file  Social History Narrative  . Not on file    Functional Status Survey:    History reviewed. No pertinent family history.  Health Maintenance  Topic Date Due  . URINE MICROALBUMIN  03/15/1971  . PNEUMOCOCCAL POLYSACCHARIDE VACCINE (1) 08/30/2017 (Originally 03/15/1963)  . FOOT EXAM  08/30/2017 (Originally 03/15/1971)  . HEMOGLOBIN A1C  08/30/2017 (Originally 05/10/1961)  . OPHTHALMOLOGY EXAM  08/30/2017 (Originally 03/15/1971)  . COLONOSCOPY  08/30/2017 (Originally 03/15/2011)  . TETANUS/TDAP  08/30/2017 (Originally 03/14/1980)  . Hepatitis C Screening  08/30/2017 (Originally [birth year])  . HIV Screening  08/30/2017 (Originally 03/14/1976)  . INFLUENZA VACCINE  08/22/2017    No Known Allergies  Allergies as of 07/30/2017   No Known Allergies     Medication List    Notice   This visit is during an admission. Changes to the med list made in this visit will be reflected in the After Visit Summary of the admission.     Review of Systems  Review of Systems  Constitutional: Negative for activity change, appetite  change, chills, diaphoresis, fatigue and fever.  HENT: Negative for mouth sores, postnasal drip, rhinorrhea, sinus pain and sore throat.   Respiratory: Negative for apnea, cough, chest tightness, shortness of breath and wheezing.   Cardiovascular: Negative for chest pain, palpitations and leg swelling.  Gastrointestinal: Negative for abdominal distention, abdominal pain, constipation, diarrhea, nausea and vomiting.  Genitourinary: Negative for dysuria and frequency.  Musculoskeletal: Negative for arthralgias, joint swelling and myalgias.  Skin: Negative for rash.  Neurological: Negative for dizziness, syncope, weakness, light-headedness and numbness.  Psychiatric/Behavioral: Negative for behavioral problems, confusion and sleep disturbance.     Vitals:   08/04/17 1850  BP: 117/73  Pulse: 97  Resp: 16  Temp: 97.7 F (36.5  C)   There is no height or weight on file to calculate BMI. Physical Exam  Constitutional: Oriented to person, place, and time. Well-developed and well-nourished.  HENT:  Head: Normocephalic.  Mouth/Throat: Oropharynx is clear and moist.  Eyes: Pupils are equal, round, and reactive to light.  Neck: Neck supple.  Cardiovascular: Normal rate and normal heart sounds.  No murmur heard. Pulmonary/Chest: Effort normal and breath sounds normal. No respiratory distress. No wheezes. She has no rales.  Abdominal: Soft. Bowel sounds are normal. No distension. There is no tenderness. There is no rebound.  Musculoskeletal: No edema.  Lymphadenopathy: none Neurological: Alert and oriented to person, place, and time.  Skin: Skin is warm and dry.  Psychiatric: Normal mood and affect. Behavior is normal. Thought content normal.    Labs reviewed: Basic Metabolic Panel: Recent Labs    09/20/16 0929 04/10/17 0858 06/12/17 0929  NA 135 136 132*  K 4.4 3.7 3.2*  CL 102 103 103  CO2 24 24 18*  GLUCOSE 188* 161* 266*  BUN '15 14 18  '$ CREATININE 1.18 1.06 1.25*  CALCIUM  8.9 8.4* 8.3*   Liver Function Tests: Recent Labs    09/20/16 0929 04/10/17 0858 06/12/17 0929  AST 41 40 47*  ALT 53 55 38  ALKPHOS 83 81 105  BILITOT 0.9 0.9 0.9  PROT 7.4 7.1 7.3  ALBUMIN 4.1 3.9 3.3*   No results for input(s): LIPASE, AMYLASE in the last 8760 hours. No results for input(s): AMMONIA in the last 8760 hours. CBC: Recent Labs    09/20/16 0929 04/10/17 0858 06/12/17 0929  WBC 2.7* 2.3* 16.7*  NEUTROABS 1.0* 0.9* 0.3*  HGB 13.1 12.2* 9.1*  HCT 37.1* 37.0* 26.3*  MCV 99.2 102.8* 101.5*  PLT 90* 99* 7*   Cardiac Enzymes: No results for input(s): CKTOTAL, CKMB, CKMBINDEX, TROPONINI in the last 8760 hours. BNP: Invalid input(s): POCBNP No results found for: HGBA1C No results found for: TSH Lab Results  Component Value Date   VITAMINB12 317 04/10/2017   Lab Results  Component Value Date   FOLATE 20.8 10/21/2015   Lab Results  Component Value Date   IRON 63 10/21/2015   TIBC 357 10/21/2015   FERRITIN 231 10/21/2015    Imaging and Procedures obtained prior to SNF admission: Dg Chest 2 View  Result Date: 06/12/2017 CLINICAL DATA:  Syncope, history multiple myeloma, prior stem-cell transplant, diabetes mellitus, hypertension EXAM: CHEST - 2 VIEW COMPARISON:  02/19/2017 FINDINGS: Lordotic positioning. Normal heart size, mediastinal contours, and pulmonary vascularity. Lungs clear. No pleural effusion or pneumothorax. Bones demineralized with scattered degenerative disc disease changes thoracic spine. IMPRESSION: No acute abnormalities. Electronically Signed   By: Lavonia Dana M.D.   On: 06/12/2017 11:09    Assessment/Plan  B-cell acute lymphoblastic leukemia (ALL)  Patient is to follow with his oncology  Pancytopenia  He is on acyclovir and Bactrim His blood work follow-up was ordered for her oncology recommendations  Type 2 diabetes mellitus  Accu-Cheks Will continue Lantus for now We will also start him back on his metformin 500 twice  daily Postural hypotension Continue on midodrine Will slowly taper when patient becomes more independent with therapy  Left upper lobe lesion Recommendation follow-up in 3 to 4 months with CT Scan History of enterococcus bacteremia Treated completely with vancomycin in the hospital Weakness with deconditioning Patient will start therapy He plans to go home  Family/ staff Communication:   Labs/tests ordered: Total time spent in this patient care encounter was  45_ minutes; greater than 50% of the visit spent counseling patient, reviewing records , Labs and coordinating care for problems addressed at this encounter.

## 2017-07-30 NOTE — Progress Notes (Signed)
I spoke with Darliss Cheney, RN at [facility] and she states that patient was discharged today to [facility].  Prior to his discharge they discontinued his PICC line.  They no longer need [place] to provide PICC management.  Patient is to still come for lab work on 7/15 as previously scheduled and he will return to Dr. Florene Glen on 7/23.  [place] notified of patient's appointment.

## 2017-08-04 ENCOUNTER — Encounter: Payer: Self-pay | Admitting: Internal Medicine

## 2017-08-04 DIAGNOSIS — I951 Orthostatic hypotension: Secondary | ICD-10-CM | POA: Insufficient documentation

## 2017-08-05 ENCOUNTER — Other Ambulatory Visit (HOSPITAL_COMMUNITY)
Admission: RE | Admit: 2017-08-05 | Discharge: 2017-08-05 | Disposition: A | Discharge status: Home / Self Care | Payer: Medicare Other | Source: Skilled Nursing Facility | Attending: Internal Medicine | Admitting: Internal Medicine

## 2017-08-05 ENCOUNTER — Other Ambulatory Visit (HOSPITAL_COMMUNITY): Payer: Medicare Other

## 2017-08-05 DIAGNOSIS — C9001 Multiple myeloma in remission: Secondary | ICD-10-CM | POA: Insufficient documentation

## 2017-08-05 LAB — COMPREHENSIVE METABOLIC PANEL
ALBUMIN: 2.7 g/dL — AB (ref 3.5–5.0)
ALK PHOS: 399 U/L — AB (ref 38–126)
ALT: 50 U/L — AB (ref 0–44)
AST: 36 U/L (ref 15–41)
Anion gap: 8 (ref 5–15)
BILIRUBIN TOTAL: 1.4 mg/dL — AB (ref 0.3–1.2)
BUN: 13 mg/dL (ref 6–20)
CALCIUM: 8.6 mg/dL — AB (ref 8.9–10.3)
CO2: 28 mmol/L (ref 22–32)
Chloride: 104 mmol/L (ref 98–111)
Creatinine, Ser: 0.73 mg/dL (ref 0.61–1.24)
GFR calc Af Amer: >60 mL/min (ref >60)
GFR calc non Af Amer: >60 mL/min (ref >60)
Glucose, Bld: 148 mg/dL — ABNORMAL HIGH (ref 70–99)
Potassium: 3.7 mmol/L (ref 3.5–5.1)
Sodium: 140 mmol/L (ref 135–145)
TOTAL PROTEIN: 6.1 g/dL — AB (ref 6.5–8.1)

## 2017-08-05 LAB — CBC WITH DIFFERENTIAL/PLATELET
BASOS ABS: 0 10*3/uL (ref 0.0–0.1)
Basophils Relative: 0 %
Eosinophils Absolute: 0.1 10*3/uL (ref 0.0–0.7)
Eosinophils Relative: 2 %
HEMATOCRIT: 25.5 % — AB (ref 39.0–52.0)
HEMOGLOBIN: 7.8 g/dL — AB (ref 13.0–17.0)
Lymphocytes Relative: 6 %
Lymphs Abs: 0.4 10*3/uL — ABNORMAL LOW (ref 0.7–4.0)
MCH: 30.6 pg (ref 26.0–34.0)
MCHC: 30.6 g/dL (ref 30.0–36.0)
MCV: 100 fL (ref 78.0–100.0)
Monocytes Absolute: 1.2 10*3/uL — ABNORMAL HIGH (ref 0.1–1.0)
Monocytes Relative: 17 %
NEUTROS ABS: 5.3 10*3/uL (ref 1.7–7.7)
Neutrophils Relative %: 75 %
Platelets: 133 10*3/uL — ABNORMAL LOW (ref 150–400)
RBC: 2.55 MIL/uL — AB (ref 4.22–5.81)
RDW: 24 % — ABNORMAL HIGH (ref 11.5–15.5)
WBC: 7 10*3/uL (ref 4.0–10.5)

## 2017-08-05 LAB — MAGNESIUM: Magnesium: 1.7 mg/dL (ref 1.7–2.4)

## 2017-08-06 ENCOUNTER — Other Ambulatory Visit (HOSPITAL_COMMUNITY): Payer: Self-pay | Admitting: *Deleted

## 2017-08-06 ENCOUNTER — Inpatient Hospital Stay (HOSPITAL_COMMUNITY): Payer: Medicare Other | Attending: Internal Medicine

## 2017-08-06 DIAGNOSIS — C9001 Multiple myeloma in remission: Secondary | ICD-10-CM

## 2017-08-06 LAB — PREPARE RBC (CROSSMATCH)

## 2017-08-06 LAB — ABO/RH: ABO/RH(D): O POS

## 2017-08-07 ENCOUNTER — Encounter (HOSPITAL_COMMUNITY): Payer: Self-pay

## 2017-08-07 ENCOUNTER — Inpatient Hospital Stay (HOSPITAL_COMMUNITY): Payer: Medicare Other

## 2017-08-07 VITALS — BP 132/85 | HR 87 | Temp 98.0°F | Resp 18 | Wt 184.3 lb

## 2017-08-07 DIAGNOSIS — D696 Thrombocytopenia, unspecified: Secondary | ICD-10-CM

## 2017-08-07 DIAGNOSIS — D6481 Anemia due to antineoplastic chemotherapy: Secondary | ICD-10-CM

## 2017-08-07 DIAGNOSIS — C9001 Multiple myeloma in remission: Secondary | ICD-10-CM

## 2017-08-07 DIAGNOSIS — T451X5A Adverse effect of antineoplastic and immunosuppressive drugs, initial encounter: Secondary | ICD-10-CM

## 2017-08-07 MED ORDER — ACETAMINOPHEN 325 MG PO TABS
650.0000 mg | ORAL_TABLET | Freq: Once | ORAL | Status: AC
  Administered 2017-08-07: 650 mg via ORAL

## 2017-08-07 MED ORDER — SODIUM CHLORIDE 0.9 % IV SOLN
INTRAVENOUS | Status: DC
  Administered 2017-08-07: 09:00:00 via INTRAVENOUS

## 2017-08-07 MED ORDER — DIPHENHYDRAMINE HCL 25 MG PO CAPS
25.0000 mg | ORAL_CAPSULE | Freq: Once | ORAL | Status: AC
  Administered 2017-08-07: 25 mg via ORAL

## 2017-08-07 MED ORDER — ACETAMINOPHEN 325 MG PO TABS
ORAL_TABLET | ORAL | Status: AC
  Filled 2017-08-07: qty 2

## 2017-08-07 MED ORDER — DIPHENHYDRAMINE HCL 25 MG PO CAPS
ORAL_CAPSULE | ORAL | Status: AC
  Filled 2017-08-07: qty 1

## 2017-08-07 MED ORDER — SODIUM CHLORIDE 0.9% FLUSH
10.0000 mL | Freq: Once | INTRAVENOUS | Status: AC
  Administered 2017-08-07: 10 mL via INTRAVENOUS

## 2017-08-07 NOTE — Patient Instructions (Signed)
[facility]  Discharge Instructions:  You received 2 units of blood today.  _______________________________________________________________  Thank you for choosing [facility] at [facility] to provide your oncology and hematology care.  To afford each patient quality time with our providers, please arrive at least 15 minutes before your scheduled appointment.  You need to re-schedule your appointment if you arrive 10 or more minutes late.  We strive to give you quality time with our providers, and arriving late affects you and other patients whose appointments are after yours.  Also, if you no show three or more times for appointments you may be dismissed from the clinic.  Again, thank you for choosing [facility] at [facility]. Our hope is that these requests will allow you access to exceptional care and in a timely manner. _______________________________________________________________  If you have questions after your visit, please contact our office at [phone] between the hours of 8:30 a.m. and 5:00 p.m. Voicemails left after 4:30 p.m. will not be returned until the following business day. _______________________________________________________________  For prescription refill requests, have your pharmacy contact our office. _______________________________________________________________  Recommendations made by the consultant and any test results will be sent to your referring physician. _______________________________________________________________ 

## 2017-08-07 NOTE — Progress Notes (Signed)
Patient tolerated blood transfusion with no complaints voiced.  Peripheral IV site clean and dry with no bruising or swelling noted at site.  Band aid applied.  VSS with discharge and left by wheelchair back to rehab.  No s/s of distress noted.

## 2017-08-08 LAB — TYPE AND SCREEN
ABO/RH(D): O POS
Antibody Screen: NEGATIVE
Unit division: 0
Unit division: 0

## 2017-08-08 LAB — BPAM RBC
Blood Product Expiration Date: 201908172359
Blood Product Expiration Date: 201908172359
ISSUE DATE / TIME: 201907170927
ISSUE DATE / TIME: 201907171148
UNIT TYPE AND RH: 5100
UNIT TYPE AND RH: 5100

## 2017-08-13 DIAGNOSIS — C9100 Acute lymphoblastic leukemia not having achieved remission: Secondary | ICD-10-CM | POA: Diagnosis not present

## 2017-08-13 DIAGNOSIS — C9001 Multiple myeloma in remission: Secondary | ICD-10-CM | POA: Diagnosis not present

## 2017-08-13 DIAGNOSIS — Q998 Other specified chromosome abnormalities: Secondary | ICD-10-CM | POA: Diagnosis not present

## 2017-08-13 DIAGNOSIS — Z9229 Personal history of other drug therapy: Secondary | ICD-10-CM | POA: Diagnosis not present

## 2017-08-13 DIAGNOSIS — R7881 Bacteremia: Secondary | ICD-10-CM | POA: Diagnosis not present

## 2017-08-13 DIAGNOSIS — C9101 Acute lymphoblastic leukemia, in remission: Secondary | ICD-10-CM | POA: Diagnosis not present

## 2017-08-13 DIAGNOSIS — Z9484 Stem cells transplant status: Secondary | ICD-10-CM | POA: Diagnosis not present

## 2017-08-14 ENCOUNTER — Other Ambulatory Visit: Payer: Self-pay | Admitting: *Deleted

## 2017-08-14 NOTE — Patient Outreach (Signed)
[facility]) Care Management  08/14/2017  GURMAN ASHLAND [birth year] [id number]   Met with Tammy, [facility] nurse, she reports that she expects patient to discharge soon, as he has had his outpatient procedures. She does not anticipate any [facility] CM needs.  Onsite meeting with Stephens November, SW at facility. She reports that patient is doing well and going home with his twin brother.   No [facility] community CM needs identified at this time.  Will sign off. Royetta Crochet. Laymond Purser, RN, BSN, [place] [phone]) Business Cell  [phone]) Toll Free Office

## 2017-08-15 ENCOUNTER — Encounter: Payer: Self-pay | Admitting: Internal Medicine

## 2017-08-15 ENCOUNTER — Non-Acute Institutional Stay (SKILLED_NURSING_FACILITY): Payer: Medicare Other | Admitting: Internal Medicine

## 2017-08-15 DIAGNOSIS — D6481 Anemia due to antineoplastic chemotherapy: Secondary | ICD-10-CM | POA: Diagnosis not present

## 2017-08-15 DIAGNOSIS — E118 Type 2 diabetes mellitus with unspecified complications: Secondary | ICD-10-CM | POA: Diagnosis not present

## 2017-08-15 DIAGNOSIS — D61818 Other pancytopenia: Secondary | ICD-10-CM | POA: Diagnosis not present

## 2017-08-15 DIAGNOSIS — T451X5A Adverse effect of antineoplastic and immunosuppressive drugs, initial encounter: Secondary | ICD-10-CM | POA: Diagnosis not present

## 2017-08-15 DIAGNOSIS — C9100 Acute lymphoblastic leukemia not having achieved remission: Secondary | ICD-10-CM

## 2017-08-15 NOTE — Progress Notes (Signed)
Location:   [place] Room Number: 129/P Place of Service:  SNF (31) Provider:  Veleta Miners MD  [name], MD  Patient Care Team: [name], MD as PCP - General (Internal Medicine)  Extended Emergency Contact Information Primary Emergency Contact: Janann August, [place] [address] Johnnette Litter of [place] Phone: [phone] Mobile Phone: [phone] Relation: Brother Secondary Emergency Contact: Carlynn Purl, [place] [address] Johnnette Litter of [place] Phone: [phone] Relation: Brother  Code Status:  Full Code Goals of care: Advanced Directive information Advanced Directives 08/15/2017  Does Patient Have a Medical Advance Directive? Yes  Type of Advance Directive (No Data)  Does patient want to make changes to medical advance directive? No - Patient declined  Would patient like information on creating a medical advance directive? -     Chief Complaint  Patient presents with  . Acute Visit    Patient is being seen for Discharge from the Facility.    HPI:  Pt is a 57 y.o. male seen today for an acute visit for BS management and discharge from the Facility. Patient was admitted to the [facility] From 05/22 - 07/09   Patient has a history of IgG With Kappa, Multiple Myeloma S/P Autologous SCT on Lenalidomide, DM type 2 And HTN.   He was admitted in the [facility] after having episode of Syncope in the [facility] ED when he was waiting to get evaluated for Tongue Lesion.His Lab Work  showed Abnormal Blast and Plasma cells. Bone Marrow Biopsy revealed Acute B Cell ALL and he was started on Chemotherapy.  He developed  Neutropenic Fever. Blood Culture were Positive for Vancomycin Sensitive Enterococcus which was treated With IV Vanco and then Po  Moxifloxacin. He was discharged on Acyclovir and Bactrim. He also had issues with Anasarca thought to be due to Hypoalbuminemia. He underwent Paracentesis and Left  Thoracocentesis with no return of his symptoms.he was not discharged on Diuretics. Tongue Lesion on his  tongue was biopsied and was negative for any Malignancy. He also had micronodules which were new in [facility].  Which needs follow-up in 3 to 4 months. Patient  had some issues with orthostatic hypertension due to hypo-albuminemia Controlled with Midodrine. He also had work-up for his right knee pain.X-ray showed no acute fracture.  Synovial fluid collected was unremarkable. He Uncontrolled BS in the Hospital due ot being on High Dose Of Steroids  and was started on Lantus.  His metformin was discontinued. His lisinopril and Crestor  was discontinued due to hypotension and Transaminates.  Due to his prolonged hospitalization patient was very weak and was unable to walk without assist.  He was discharged to SNF for therapy. In the facility patient did very well with therapy.  He is now walking with a Lorenzo and no assist.  He stated independent in his ADLs. His main concern is that he is still on insulin.  We were unable to taper him off Lantus. He is working with the nurses to learn how to give himself a shot. He also did not have any return symptoms of shortness of breath and abdominal distention.  He did have anemia on his labs and and received 2 units of blood transfusion in the cancer center. Patient plan to go home.  He has very supportive family.    Past Medical History:  Diagnosis Date  . B12 deficiency 10/23/2015  .  Cancer ([place])   . Diabetes mellitus without complication ([place])   . HTN (hypertension)   . Multiple myeloma ([place])   . Multiple myeloma in remission ([place]) 07/30/2015  . Pneumonia   . Sepsis(995.91)   . Stem cells transplant status [facility])    Past Surgical History:  Procedure Laterality Date  . LIMBAL STEM CELL TRANSPLANT    . NOSE SURGERY     "when I was a child"    No Known Allergies  Outpatient Encounter Medications as of 08/15/2017  Medication Sig  . acyclovir  (ZOVIRAX) 800 MG tablet Take 800 mg by mouth 2 (two) times daily.  Roseanne Kaufman [place]-Castor Oil OINT Apply as needed for flatus four times a day prn  . calcium carbonate (TUMS EX) 750 MG chewable tablet Chew 1 tablet by mouth 3 (three) times daily as needed for heartburn.  . Calcium Carbonate-Vitamin D3 600-400 MG-UNIT TABS Take 1,500 mg by mouth 2 (two) times daily.  . insulin glargine (LANTUS) 100 UNIT/ML injection Inject 20 Units into the skin every evening.  . insulin lispro (HUMALOG KWIKPEN) 100 UNIT/ML KiwkPen Give per sliding scale  . magnesium oxide (MAG-OX) 400 MG tablet Take 400 mg by mouth 2 (two) times daily.   . metFORMIN (GLUCOPHAGE) 500 MG tablet Take 500 mg by mouth 2 (two) times daily with a meal.  . metoprolol tartrate (LOPRESSOR) 25 MG tablet Take 12.5 mg by mouth 2 (two) times daily.  . midodrine (PROAMATINE) 5 MG tablet Take 5 mg by mouth 3 (three) times daily with meals.  . Multiple Vitamin (MULTIVITAMIN WITH MINERALS) TABS tablet Take 1 tablet by mouth daily.  . ondansetron (ZOFRAN) 8 MG tablet Take 8 mg by mouth every 8 (eight) hours as needed for nausea or vomiting.  . polyvinyl alcohol (LIQUIFILM TEARS) 1.4 % ophthalmic solution Place 1 drop into both eyes every 6 (six) hours as needed for dry eyes.  Marland Kitchen sennosides-docusate sodium (SENOKOT-S) 8.6-50 MG tablet Take 1 tablet by mouth 2 (two) times daily.  . simethicone (MYLICON) 40 PJ/0.9TO drops Take 40 mg by mouth 4 (four) times daily as needed for flatulence.  . sodium chloride (OCEAN) 0.65 % SOLN nasal spray Place 1 spray into both nostrils as needed for congestion.  . sulfamethoxazole-trimethoprim (BACTRIM DS,SEPTRA DS) 800-160 MG tablet Take 1 tablet by mouth daily. Take on Mon., Wed., Fri.  . [DISCONTINUED] aspirin 325 MG EC tablet Take 325 mg by mouth daily.   No facility-administered encounter medications on file as of 08/15/2017.      Review of Systems  Review of Systems  Constitutional: Negative for activity  change, appetite change, chills, diaphoresis, fatigue and fever.  HENT: Negative for mouth sores, postnasal drip, rhinorrhea, sinus pain and sore throat.   Respiratory: Negative for apnea, cough, chest tightness, shortness of breath and wheezing.   Cardiovascular: Negative for chest pain, palpitations and leg swelling.  Gastrointestinal: Negative for abdominal distention, abdominal pain, constipation, diarrhea, nausea and vomiting.  Genitourinary: Negative for dysuria and frequency.  Musculoskeletal: Negative for arthralgias, joint swelling and myalgias.  Skin: Negative for rash.  Neurological: Negative for dizziness, syncope, weakness, light-headedness and numbness.  Psychiatric/Behavioral: Negative for behavioral problems, confusion and sleep disturbance.     Immunization History  Administered Date(s) Administered  . Influenza,inj,Quad PF,6+ Mos 10/28/2015, 10/23/2016   Pertinent  Health Maintenance Due  Topic Date Due  . FOOT EXAM  08/30/2017 (Originally 03[birth year]15/1971)  . HEMOGLOBIN A1C  08/30/2017 (Originally Aug 24, 1960)  . OPHTHALMOLOGY EXAM  08/30/2017 (Originally  03/15/1971)  . COLONOSCOPY  08/30/2017 (Originally 03/15/2011)  . URINE MICROALBUMIN  09/15/2017 (Originally 03/15/1971)  . INFLUENZA VACCINE  08/22/2017   No flowsheet data found. Functional Status Survey:    Vitals:   08/15/17 1443  BP: 119/76  Pulse: 97  Resp: 20  Temp: 97.8 F (36.6 C)   There is no height or weight on file to calculate BMI. Physical Exam  Constitutional: He is oriented to person, place, and time. He appears well-developed and well-nourished.  HENT:  Head: Normocephalic.  Mouth/Throat: Oropharynx is clear and moist.  Eyes: Pupils are equal, round, and reactive to light.  Neck: Neck supple.  Cardiovascular: Normal rate and regular rhythm.  No murmur heard. Pulmonary/Chest: Effort normal and breath sounds normal. No stridor. No respiratory distress. He has no wheezes.  Abdominal: Soft. Bowel  sounds are normal. He exhibits no distension and no mass. There is no tenderness. There is no guarding.  Musculoskeletal: He exhibits no edema.  Neurological: He is alert and oriented to person, place, and time.  Skin: Skin is warm and dry.  Psychiatric: He has a normal mood and affect. His behavior is normal. Thought content normal.    Labs reviewed: Recent Labs    04/10/17 0858 06/12/17 0929 08/05/17 0549  NA 136 132* 140  K 3.7 3.2* 3.7  CL 103 103 104  CO2 24 18* 28  GLUCOSE 161* 266* 148*  BUN 14 18 13   CREATININE 1.06 1.25* 0.73  CALCIUM 8.4* 8.3* 8.6*  MG  --   --  1.7   Recent Labs    04/10/17 0858 06/12/17 0929 08/05/17 0549  AST 40 47* 36  ALT 55 38 50*  ALKPHOS 81 105 399*  BILITOT 0.9 0.9 1.4*  PROT 7.1 7.3 6.1*  ALBUMIN 3.9 3.3* 2.7*   Recent Labs    04/10/17 0858 06/12/17 0929 08/05/17 0549  WBC 2.3* 16.7* 7.0  NEUTROABS 0.9* 0.3* 5.3  HGB 12.2* 9.1* 7.8*  HCT 37.0* 26.3* 25.5*  MCV 102.8* 101.5* 100.0  PLT 99* 7* 133*   No results found for: TSH No results found for: HGBA1C No results found for: CHOL, HDL, LDLCALC, LDLDIRECT, TRIG, CHOLHDL  Significant Diagnostic Results in last 30 days:  No results found.  Assessment/Plan  B-cell acute lymphoblastic leukemia (ALL)  Patient is going to  follow with his oncology. Plan is to Restart his Chemo  Pancytopenia  He is on acyclovir and Bactrim Repeat Blood Workdid show Resolve of Neutropenia Will Follow with [place]  Type 2 diabetes mellitus  His Blood sugars are running From 150 in morning to more then 200 at Night Increase his metformin 1000 mg twice daily Discontinue sliding scale Decrease Lantus to 10 units Have discussed with patient and discharge nurse that he would need insulin for few days and has Learn how to give himself shots Patient with follow-up with his PCP to eventually taper off his insulin His A1C was 9.4 in the Hospital in 05/19  Increased Alk Phos Patient  has stayed completely asymptomatic Repeat hepatic panel Follow up With Cancer center  Postural hypotension On Midodrine. Managed By Oncology History of enterococcus bacteremia Treated completely with vancomycin in the hospital Weakness with deconditioning Patient has done very well with therapy Is walking with a Lorentz Patient would be discharged home  Left upper lobe lesion Recommendation follow-up in 3 to 4 months with CT Scan     Family/ staff Communication:   Labs/tests ordered:   Discharge Planning of More  then 78 Min

## 2017-08-16 ENCOUNTER — Encounter (HOSPITAL_COMMUNITY)
Admission: RE | Admit: 2017-08-16 | Discharge: 2017-08-16 | Disposition: A | Discharge status: Home / Self Care | Payer: Medicare Other | Source: Skilled Nursing Facility | Attending: Internal Medicine | Admitting: Internal Medicine

## 2017-08-16 DIAGNOSIS — E0965 Drug or chemical induced diabetes mellitus with hyperglycemia: Secondary | ICD-10-CM | POA: Insufficient documentation

## 2017-08-16 DIAGNOSIS — I951 Orthostatic hypotension: Secondary | ICD-10-CM | POA: Insufficient documentation

## 2017-08-16 DIAGNOSIS — C9100 Acute lymphoblastic leukemia not having achieved remission: Secondary | ICD-10-CM | POA: Insufficient documentation

## 2017-08-16 DIAGNOSIS — D61810 Antineoplastic chemotherapy induced pancytopenia: Secondary | ICD-10-CM | POA: Insufficient documentation

## 2017-08-16 LAB — CBC
HEMATOCRIT: 31.9 % — AB (ref 39.0–52.0)
HEMOGLOBIN: 10.3 g/dL — AB (ref 13.0–17.0)
MCH: 32.3 pg (ref 26.0–34.0)
MCHC: 32.3 g/dL (ref 30.0–36.0)
MCV: 100 fL (ref 78.0–100.0)
Platelets: 164 10*3/uL (ref 150–400)
RBC: 3.19 MIL/uL — AB (ref 4.22–5.81)
RDW: 19.5 % — ABNORMAL HIGH (ref 11.5–15.5)
WBC: 8.3 10*3/uL (ref 4.0–10.5)

## 2017-08-16 LAB — HEPATIC FUNCTION PANEL
ALBUMIN: 3.2 g/dL — AB (ref 3.5–5.0)
ALT: 43 U/L (ref 0–44)
AST: 42 U/L — AB (ref 15–41)
Alkaline Phosphatase: 327 U/L — ABNORMAL HIGH (ref 38–126)
BILIRUBIN DIRECT: 0.4 mg/dL — AB (ref 0.0–0.2)
BILIRUBIN TOTAL: 1 mg/dL (ref 0.3–1.2)
Indirect Bilirubin: 0.6 mg/dL (ref 0.3–0.9)
Total Protein: 6.8 g/dL (ref 6.5–8.1)

## 2017-08-16 LAB — BASIC METABOLIC PANEL
Anion gap: 8 (ref 5–15)
BUN: 15 mg/dL (ref 6–20)
CHLORIDE: 102 mmol/L (ref 98–111)
CO2: 28 mmol/L (ref 22–32)
Calcium: 9 mg/dL (ref 8.9–10.3)
Creatinine, Ser: 0.74 mg/dL (ref 0.61–1.24)
GFR calc Af Amer: >60 mL/min (ref >60)
GFR calc non Af Amer: >60 mL/min (ref >60)
GLUCOSE: 129 mg/dL — AB (ref 70–99)
POTASSIUM: 4.1 mmol/L (ref 3.5–5.1)
Sodium: 138 mmol/L (ref 135–145)

## 2017-08-19 DIAGNOSIS — Z794 Long term (current) use of insulin: Secondary | ICD-10-CM | POA: Diagnosis not present

## 2017-08-19 DIAGNOSIS — M6281 Muscle weakness (generalized): Secondary | ICD-10-CM | POA: Diagnosis not present

## 2017-08-19 DIAGNOSIS — E119 Type 2 diabetes mellitus without complications: Secondary | ICD-10-CM | POA: Diagnosis not present

## 2017-08-19 DIAGNOSIS — I951 Orthostatic hypotension: Secondary | ICD-10-CM | POA: Diagnosis not present

## 2017-08-19 DIAGNOSIS — I10 Essential (primary) hypertension: Secondary | ICD-10-CM | POA: Diagnosis not present

## 2017-08-19 DIAGNOSIS — D61810 Antineoplastic chemotherapy induced pancytopenia: Secondary | ICD-10-CM | POA: Diagnosis not present

## 2017-08-19 DIAGNOSIS — C9001 Multiple myeloma in remission: Secondary | ICD-10-CM | POA: Diagnosis not present

## 2017-08-19 DIAGNOSIS — C9100 Acute lymphoblastic leukemia not having achieved remission: Secondary | ICD-10-CM | POA: Diagnosis not present

## 2017-08-21 DIAGNOSIS — C9100 Acute lymphoblastic leukemia not having achieved remission: Secondary | ICD-10-CM | POA: Diagnosis not present

## 2017-08-22 DIAGNOSIS — I951 Orthostatic hypotension: Secondary | ICD-10-CM | POA: Diagnosis not present

## 2017-08-22 DIAGNOSIS — I10 Essential (primary) hypertension: Secondary | ICD-10-CM | POA: Diagnosis not present

## 2017-08-22 DIAGNOSIS — C9100 Acute lymphoblastic leukemia not having achieved remission: Secondary | ICD-10-CM | POA: Diagnosis not present

## 2017-08-22 DIAGNOSIS — E119 Type 2 diabetes mellitus without complications: Secondary | ICD-10-CM | POA: Diagnosis not present

## 2017-08-22 DIAGNOSIS — M6281 Muscle weakness (generalized): Secondary | ICD-10-CM | POA: Diagnosis not present

## 2017-08-22 DIAGNOSIS — D61810 Antineoplastic chemotherapy induced pancytopenia: Secondary | ICD-10-CM | POA: Diagnosis not present

## 2017-08-23 DIAGNOSIS — C9001 Multiple myeloma in remission: Secondary | ICD-10-CM | POA: Diagnosis not present

## 2017-08-23 DIAGNOSIS — E119 Type 2 diabetes mellitus without complications: Secondary | ICD-10-CM | POA: Diagnosis not present

## 2017-08-23 DIAGNOSIS — C9100 Acute lymphoblastic leukemia not having achieved remission: Secondary | ICD-10-CM | POA: Diagnosis not present

## 2017-08-23 DIAGNOSIS — I10 Essential (primary) hypertension: Secondary | ICD-10-CM | POA: Diagnosis not present

## 2017-08-26 ENCOUNTER — Ambulatory Visit (HOSPITAL_COMMUNITY): Payer: Medicare Other

## 2017-08-26 DIAGNOSIS — I951 Orthostatic hypotension: Secondary | ICD-10-CM | POA: Diagnosis not present

## 2017-08-26 DIAGNOSIS — M6281 Muscle weakness (generalized): Secondary | ICD-10-CM | POA: Diagnosis not present

## 2017-08-26 DIAGNOSIS — I10 Essential (primary) hypertension: Secondary | ICD-10-CM | POA: Diagnosis not present

## 2017-08-26 DIAGNOSIS — C9100 Acute lymphoblastic leukemia not having achieved remission: Secondary | ICD-10-CM | POA: Diagnosis not present

## 2017-08-26 DIAGNOSIS — D61810 Antineoplastic chemotherapy induced pancytopenia: Secondary | ICD-10-CM | POA: Diagnosis not present

## 2017-08-26 DIAGNOSIS — E119 Type 2 diabetes mellitus without complications: Secondary | ICD-10-CM | POA: Diagnosis not present

## 2017-08-27 DIAGNOSIS — I4510 Unspecified right bundle-branch block: Secondary | ICD-10-CM | POA: Diagnosis not present

## 2017-08-27 DIAGNOSIS — J9811 Atelectasis: Secondary | ICD-10-CM | POA: Diagnosis not present

## 2017-08-27 DIAGNOSIS — R000 Tachycardia, unspecified: Secondary | ICD-10-CM | POA: Diagnosis not present

## 2017-08-27 DIAGNOSIS — Z79899 Other long term (current) drug therapy: Secondary | ICD-10-CM | POA: Diagnosis not present

## 2017-08-27 DIAGNOSIS — Z794 Long term (current) use of insulin: Secondary | ICD-10-CM | POA: Diagnosis not present

## 2017-08-27 DIAGNOSIS — E119 Type 2 diabetes mellitus without complications: Secondary | ICD-10-CM | POA: Diagnosis not present

## 2017-08-27 DIAGNOSIS — I951 Orthostatic hypotension: Secondary | ICD-10-CM | POA: Diagnosis not present

## 2017-08-27 DIAGNOSIS — C9100 Acute lymphoblastic leukemia not having achieved remission: Secondary | ICD-10-CM | POA: Diagnosis not present

## 2017-08-27 DIAGNOSIS — C9000 Multiple myeloma not having achieved remission: Secondary | ICD-10-CM | POA: Diagnosis not present

## 2017-08-27 DIAGNOSIS — R918 Other nonspecific abnormal finding of lung field: Secondary | ICD-10-CM | POA: Diagnosis not present

## 2017-08-29 DIAGNOSIS — D61810 Antineoplastic chemotherapy induced pancytopenia: Secondary | ICD-10-CM | POA: Diagnosis not present

## 2017-08-29 DIAGNOSIS — C9100 Acute lymphoblastic leukemia not having achieved remission: Secondary | ICD-10-CM | POA: Diagnosis not present

## 2017-08-29 DIAGNOSIS — E119 Type 2 diabetes mellitus without complications: Secondary | ICD-10-CM | POA: Diagnosis not present

## 2017-08-29 DIAGNOSIS — M6281 Muscle weakness (generalized): Secondary | ICD-10-CM | POA: Diagnosis not present

## 2017-08-29 DIAGNOSIS — I10 Essential (primary) hypertension: Secondary | ICD-10-CM | POA: Diagnosis not present

## 2017-08-29 DIAGNOSIS — I951 Orthostatic hypotension: Secondary | ICD-10-CM | POA: Diagnosis not present

## 2017-08-30 DIAGNOSIS — E119 Type 2 diabetes mellitus without complications: Secondary | ICD-10-CM | POA: Diagnosis not present

## 2017-08-30 DIAGNOSIS — I10 Essential (primary) hypertension: Secondary | ICD-10-CM | POA: Diagnosis not present

## 2017-08-30 DIAGNOSIS — I951 Orthostatic hypotension: Secondary | ICD-10-CM | POA: Diagnosis not present

## 2017-08-30 DIAGNOSIS — C9100 Acute lymphoblastic leukemia not having achieved remission: Secondary | ICD-10-CM | POA: Diagnosis not present

## 2017-08-30 DIAGNOSIS — M6281 Muscle weakness (generalized): Secondary | ICD-10-CM | POA: Diagnosis not present

## 2017-08-30 DIAGNOSIS — D61810 Antineoplastic chemotherapy induced pancytopenia: Secondary | ICD-10-CM | POA: Diagnosis not present

## 2017-09-02 DIAGNOSIS — I10 Essential (primary) hypertension: Secondary | ICD-10-CM | POA: Diagnosis not present

## 2017-09-02 DIAGNOSIS — D61810 Antineoplastic chemotherapy induced pancytopenia: Secondary | ICD-10-CM | POA: Diagnosis not present

## 2017-09-02 DIAGNOSIS — M6281 Muscle weakness (generalized): Secondary | ICD-10-CM | POA: Diagnosis not present

## 2017-09-02 DIAGNOSIS — E119 Type 2 diabetes mellitus without complications: Secondary | ICD-10-CM | POA: Diagnosis not present

## 2017-09-02 DIAGNOSIS — I951 Orthostatic hypotension: Secondary | ICD-10-CM | POA: Diagnosis not present

## 2017-09-02 DIAGNOSIS — C9100 Acute lymphoblastic leukemia not having achieved remission: Secondary | ICD-10-CM | POA: Diagnosis not present

## 2017-09-05 DIAGNOSIS — E119 Type 2 diabetes mellitus without complications: Secondary | ICD-10-CM | POA: Diagnosis not present

## 2017-09-05 DIAGNOSIS — I951 Orthostatic hypotension: Secondary | ICD-10-CM | POA: Diagnosis not present

## 2017-09-05 DIAGNOSIS — I10 Essential (primary) hypertension: Secondary | ICD-10-CM | POA: Diagnosis not present

## 2017-09-05 DIAGNOSIS — M6281 Muscle weakness (generalized): Secondary | ICD-10-CM | POA: Diagnosis not present

## 2017-09-05 DIAGNOSIS — D61810 Antineoplastic chemotherapy induced pancytopenia: Secondary | ICD-10-CM | POA: Diagnosis not present

## 2017-09-05 DIAGNOSIS — C9100 Acute lymphoblastic leukemia not having achieved remission: Secondary | ICD-10-CM | POA: Diagnosis not present

## 2017-09-09 DIAGNOSIS — I951 Orthostatic hypotension: Secondary | ICD-10-CM | POA: Diagnosis not present

## 2017-09-09 DIAGNOSIS — M6281 Muscle weakness (generalized): Secondary | ICD-10-CM | POA: Diagnosis not present

## 2017-09-09 DIAGNOSIS — D61810 Antineoplastic chemotherapy induced pancytopenia: Secondary | ICD-10-CM | POA: Diagnosis not present

## 2017-09-09 DIAGNOSIS — E119 Type 2 diabetes mellitus without complications: Secondary | ICD-10-CM | POA: Diagnosis not present

## 2017-09-09 DIAGNOSIS — C9100 Acute lymphoblastic leukemia not having achieved remission: Secondary | ICD-10-CM | POA: Diagnosis not present

## 2017-09-09 DIAGNOSIS — I10 Essential (primary) hypertension: Secondary | ICD-10-CM | POA: Diagnosis not present

## 2017-09-10 DIAGNOSIS — R739 Hyperglycemia, unspecified: Secondary | ICD-10-CM | POA: Diagnosis not present

## 2017-09-10 DIAGNOSIS — T380X5A Adverse effect of glucocorticoids and synthetic analogues, initial encounter: Secondary | ICD-10-CM | POA: Diagnosis not present

## 2017-09-10 DIAGNOSIS — R918 Other nonspecific abnormal finding of lung field: Secondary | ICD-10-CM | POA: Diagnosis not present

## 2017-09-10 DIAGNOSIS — I10 Essential (primary) hypertension: Secondary | ICD-10-CM | POA: Diagnosis not present

## 2017-09-10 DIAGNOSIS — C9100 Acute lymphoblastic leukemia not having achieved remission: Secondary | ICD-10-CM | POA: Diagnosis not present

## 2017-09-10 DIAGNOSIS — R000 Tachycardia, unspecified: Secondary | ICD-10-CM | POA: Diagnosis not present

## 2017-09-10 DIAGNOSIS — E785 Hyperlipidemia, unspecified: Secondary | ICD-10-CM | POA: Diagnosis not present

## 2017-09-10 DIAGNOSIS — R791 Abnormal coagulation profile: Secondary | ICD-10-CM | POA: Diagnosis not present

## 2017-09-10 DIAGNOSIS — C9001 Multiple myeloma in remission: Secondary | ICD-10-CM | POA: Diagnosis not present

## 2017-09-10 DIAGNOSIS — D696 Thrombocytopenia, unspecified: Secondary | ICD-10-CM | POA: Diagnosis not present

## 2017-09-10 DIAGNOSIS — I951 Orthostatic hypotension: Secondary | ICD-10-CM | POA: Diagnosis not present

## 2017-09-10 DIAGNOSIS — Z9484 Stem cells transplant status: Secondary | ICD-10-CM | POA: Diagnosis not present

## 2017-09-13 DIAGNOSIS — C9100 Acute lymphoblastic leukemia not having achieved remission: Secondary | ICD-10-CM | POA: Diagnosis not present

## 2017-09-13 DIAGNOSIS — E119 Type 2 diabetes mellitus without complications: Secondary | ICD-10-CM | POA: Diagnosis not present

## 2017-09-13 DIAGNOSIS — D61810 Antineoplastic chemotherapy induced pancytopenia: Secondary | ICD-10-CM | POA: Diagnosis not present

## 2017-09-13 DIAGNOSIS — I10 Essential (primary) hypertension: Secondary | ICD-10-CM | POA: Diagnosis not present

## 2017-09-13 DIAGNOSIS — M6281 Muscle weakness (generalized): Secondary | ICD-10-CM | POA: Diagnosis not present

## 2017-09-13 DIAGNOSIS — I951 Orthostatic hypotension: Secondary | ICD-10-CM | POA: Diagnosis not present

## 2017-09-16 DIAGNOSIS — M6281 Muscle weakness (generalized): Secondary | ICD-10-CM | POA: Diagnosis not present

## 2017-09-16 DIAGNOSIS — E119 Type 2 diabetes mellitus without complications: Secondary | ICD-10-CM | POA: Diagnosis not present

## 2017-09-16 DIAGNOSIS — D61810 Antineoplastic chemotherapy induced pancytopenia: Secondary | ICD-10-CM | POA: Diagnosis not present

## 2017-09-16 DIAGNOSIS — I951 Orthostatic hypotension: Secondary | ICD-10-CM | POA: Diagnosis not present

## 2017-09-16 DIAGNOSIS — I10 Essential (primary) hypertension: Secondary | ICD-10-CM | POA: Diagnosis not present

## 2017-09-16 DIAGNOSIS — C9100 Acute lymphoblastic leukemia not having achieved remission: Secondary | ICD-10-CM | POA: Diagnosis not present

## 2017-09-17 DIAGNOSIS — C9100 Acute lymphoblastic leukemia not having achieved remission: Secondary | ICD-10-CM | POA: Diagnosis not present

## 2017-09-17 DIAGNOSIS — R918 Other nonspecific abnormal finding of lung field: Secondary | ICD-10-CM | POA: Diagnosis not present

## 2017-09-17 DIAGNOSIS — I10 Essential (primary) hypertension: Secondary | ICD-10-CM | POA: Diagnosis not present

## 2017-09-17 DIAGNOSIS — Z794 Long term (current) use of insulin: Secondary | ICD-10-CM | POA: Diagnosis not present

## 2017-09-17 DIAGNOSIS — E785 Hyperlipidemia, unspecified: Secondary | ICD-10-CM | POA: Diagnosis not present

## 2017-09-17 DIAGNOSIS — I951 Orthostatic hypotension: Secondary | ICD-10-CM | POA: Diagnosis not present

## 2017-09-17 DIAGNOSIS — C9101 Acute lymphoblastic leukemia, in remission: Secondary | ICD-10-CM | POA: Diagnosis not present

## 2017-09-17 DIAGNOSIS — Z9484 Stem cells transplant status: Secondary | ICD-10-CM | POA: Diagnosis not present

## 2017-09-17 DIAGNOSIS — E119 Type 2 diabetes mellitus without complications: Secondary | ICD-10-CM | POA: Diagnosis not present

## 2017-09-17 DIAGNOSIS — T380X5A Adverse effect of glucocorticoids and synthetic analogues, initial encounter: Secondary | ICD-10-CM | POA: Diagnosis not present

## 2017-09-17 DIAGNOSIS — E538 Deficiency of other specified B group vitamins: Secondary | ICD-10-CM | POA: Diagnosis not present

## 2017-09-17 DIAGNOSIS — C9001 Multiple myeloma in remission: Secondary | ICD-10-CM | POA: Diagnosis not present

## 2017-09-17 DIAGNOSIS — R000 Tachycardia, unspecified: Secondary | ICD-10-CM | POA: Diagnosis not present

## 2017-09-19 DIAGNOSIS — Z9484 Stem cells transplant status: Secondary | ICD-10-CM | POA: Diagnosis not present

## 2017-09-19 DIAGNOSIS — C9100 Acute lymphoblastic leukemia not having achieved remission: Secondary | ICD-10-CM | POA: Diagnosis not present

## 2017-09-19 DIAGNOSIS — B49 Unspecified mycosis: Secondary | ICD-10-CM | POA: Diagnosis not present

## 2017-09-20 DIAGNOSIS — E119 Type 2 diabetes mellitus without complications: Secondary | ICD-10-CM | POA: Diagnosis not present

## 2017-09-20 DIAGNOSIS — I10 Essential (primary) hypertension: Secondary | ICD-10-CM | POA: Diagnosis not present

## 2017-09-20 DIAGNOSIS — I951 Orthostatic hypotension: Secondary | ICD-10-CM | POA: Diagnosis not present

## 2017-09-20 DIAGNOSIS — D61810 Antineoplastic chemotherapy induced pancytopenia: Secondary | ICD-10-CM | POA: Diagnosis not present

## 2017-09-20 DIAGNOSIS — M6281 Muscle weakness (generalized): Secondary | ICD-10-CM | POA: Diagnosis not present

## 2017-09-20 DIAGNOSIS — C9100 Acute lymphoblastic leukemia not having achieved remission: Secondary | ICD-10-CM | POA: Diagnosis not present

## 2017-09-24 DIAGNOSIS — E1165 Type 2 diabetes mellitus with hyperglycemia: Secondary | ICD-10-CM | POA: Diagnosis not present

## 2017-09-24 DIAGNOSIS — Z9484 Stem cells transplant status: Secondary | ICD-10-CM | POA: Diagnosis not present

## 2017-09-24 DIAGNOSIS — C9001 Multiple myeloma in remission: Secondary | ICD-10-CM | POA: Diagnosis not present

## 2017-09-24 DIAGNOSIS — R918 Other nonspecific abnormal finding of lung field: Secondary | ICD-10-CM | POA: Diagnosis not present

## 2017-09-24 DIAGNOSIS — E538 Deficiency of other specified B group vitamins: Secondary | ICD-10-CM | POA: Diagnosis not present

## 2017-09-24 DIAGNOSIS — I10 Essential (primary) hypertension: Secondary | ICD-10-CM | POA: Diagnosis not present

## 2017-09-24 DIAGNOSIS — T380X5D Adverse effect of glucocorticoids and synthetic analogues, subsequent encounter: Secondary | ICD-10-CM | POA: Diagnosis not present

## 2017-09-24 DIAGNOSIS — Z95828 Presence of other vascular implants and grafts: Secondary | ICD-10-CM | POA: Diagnosis not present

## 2017-09-24 DIAGNOSIS — Z794 Long term (current) use of insulin: Secondary | ICD-10-CM | POA: Diagnosis not present

## 2017-09-24 DIAGNOSIS — C9100 Acute lymphoblastic leukemia not having achieved remission: Secondary | ICD-10-CM | POA: Diagnosis not present

## 2017-09-24 DIAGNOSIS — E785 Hyperlipidemia, unspecified: Secondary | ICD-10-CM | POA: Diagnosis not present

## 2017-09-24 DIAGNOSIS — I951 Orthostatic hypotension: Secondary | ICD-10-CM | POA: Diagnosis not present

## 2017-09-24 DIAGNOSIS — R000 Tachycardia, unspecified: Secondary | ICD-10-CM | POA: Diagnosis not present

## 2017-09-25 DIAGNOSIS — E119 Type 2 diabetes mellitus without complications: Secondary | ICD-10-CM | POA: Diagnosis not present

## 2017-09-25 DIAGNOSIS — C9100 Acute lymphoblastic leukemia not having achieved remission: Secondary | ICD-10-CM | POA: Diagnosis not present

## 2017-09-25 DIAGNOSIS — M6281 Muscle weakness (generalized): Secondary | ICD-10-CM | POA: Diagnosis not present

## 2017-09-25 DIAGNOSIS — I951 Orthostatic hypotension: Secondary | ICD-10-CM | POA: Diagnosis not present

## 2017-09-25 DIAGNOSIS — I10 Essential (primary) hypertension: Secondary | ICD-10-CM | POA: Diagnosis not present

## 2017-09-25 DIAGNOSIS — D61810 Antineoplastic chemotherapy induced pancytopenia: Secondary | ICD-10-CM | POA: Diagnosis not present

## 2017-09-26 ENCOUNTER — Ambulatory Visit (HOSPITAL_COMMUNITY): Payer: Medicare Other

## 2017-09-27 DIAGNOSIS — I951 Orthostatic hypotension: Secondary | ICD-10-CM | POA: Diagnosis not present

## 2017-09-27 DIAGNOSIS — E119 Type 2 diabetes mellitus without complications: Secondary | ICD-10-CM | POA: Diagnosis not present

## 2017-09-27 DIAGNOSIS — D61810 Antineoplastic chemotherapy induced pancytopenia: Secondary | ICD-10-CM | POA: Diagnosis not present

## 2017-09-27 DIAGNOSIS — C9100 Acute lymphoblastic leukemia not having achieved remission: Secondary | ICD-10-CM | POA: Diagnosis not present

## 2017-09-27 DIAGNOSIS — M6281 Muscle weakness (generalized): Secondary | ICD-10-CM | POA: Diagnosis not present

## 2017-09-27 DIAGNOSIS — I10 Essential (primary) hypertension: Secondary | ICD-10-CM | POA: Diagnosis not present

## 2017-09-30 DIAGNOSIS — I10 Essential (primary) hypertension: Secondary | ICD-10-CM | POA: Diagnosis not present

## 2017-09-30 DIAGNOSIS — Z7189 Other specified counseling: Secondary | ICD-10-CM | POA: Diagnosis not present

## 2017-09-30 DIAGNOSIS — M6281 Muscle weakness (generalized): Secondary | ICD-10-CM | POA: Diagnosis not present

## 2017-09-30 DIAGNOSIS — C9001 Multiple myeloma in remission: Secondary | ICD-10-CM | POA: Diagnosis not present

## 2017-09-30 DIAGNOSIS — B49 Unspecified mycosis: Secondary | ICD-10-CM | POA: Diagnosis not present

## 2017-09-30 DIAGNOSIS — I951 Orthostatic hypotension: Secondary | ICD-10-CM | POA: Diagnosis not present

## 2017-09-30 DIAGNOSIS — C9100 Acute lymphoblastic leukemia not having achieved remission: Secondary | ICD-10-CM | POA: Diagnosis not present

## 2017-09-30 DIAGNOSIS — Z01818 Encounter for other preprocedural examination: Secondary | ICD-10-CM | POA: Diagnosis not present

## 2017-09-30 DIAGNOSIS — D61810 Antineoplastic chemotherapy induced pancytopenia: Secondary | ICD-10-CM | POA: Diagnosis not present

## 2017-09-30 DIAGNOSIS — Z9484 Stem cells transplant status: Secondary | ICD-10-CM | POA: Diagnosis not present

## 2017-09-30 DIAGNOSIS — E119 Type 2 diabetes mellitus without complications: Secondary | ICD-10-CM | POA: Diagnosis not present

## 2017-10-01 DIAGNOSIS — M25561 Pain in right knee: Secondary | ICD-10-CM | POA: Diagnosis not present

## 2017-10-01 DIAGNOSIS — Z794 Long term (current) use of insulin: Secondary | ICD-10-CM | POA: Diagnosis not present

## 2017-10-01 DIAGNOSIS — Z79899 Other long term (current) drug therapy: Secondary | ICD-10-CM | POA: Diagnosis not present

## 2017-10-01 DIAGNOSIS — Z8579 Personal history of other malignant neoplasms of lymphoid, hematopoietic and related tissues: Secondary | ICD-10-CM | POA: Diagnosis not present

## 2017-10-01 DIAGNOSIS — D61810 Antineoplastic chemotherapy induced pancytopenia: Secondary | ICD-10-CM | POA: Diagnosis present

## 2017-10-01 DIAGNOSIS — C9100 Acute lymphoblastic leukemia not having achieved remission: Secondary | ICD-10-CM | POA: Diagnosis not present

## 2017-10-01 DIAGNOSIS — Z823 Family history of stroke: Secondary | ICD-10-CM | POA: Diagnosis not present

## 2017-10-01 DIAGNOSIS — Z9484 Stem cells transplant status: Secondary | ICD-10-CM | POA: Diagnosis not present

## 2017-10-01 DIAGNOSIS — C9101 Acute lymphoblastic leukemia, in remission: Secondary | ICD-10-CM | POA: Diagnosis present

## 2017-10-01 DIAGNOSIS — E538 Deficiency of other specified B group vitamins: Secondary | ICD-10-CM | POA: Diagnosis present

## 2017-10-01 DIAGNOSIS — J158 Pneumonia due to other specified bacteria: Secondary | ICD-10-CM | POA: Diagnosis present

## 2017-10-01 DIAGNOSIS — D61818 Other pancytopenia: Secondary | ICD-10-CM | POA: Diagnosis not present

## 2017-10-01 DIAGNOSIS — I10 Essential (primary) hypertension: Secondary | ICD-10-CM | POA: Diagnosis present

## 2017-10-01 DIAGNOSIS — I951 Orthostatic hypotension: Secondary | ICD-10-CM | POA: Diagnosis present

## 2017-10-01 DIAGNOSIS — E785 Hyperlipidemia, unspecified: Secondary | ICD-10-CM | POA: Diagnosis present

## 2017-10-01 DIAGNOSIS — B488 Other specified mycoses: Secondary | ICD-10-CM | POA: Diagnosis present

## 2017-10-01 DIAGNOSIS — Z833 Family history of diabetes mellitus: Secondary | ICD-10-CM | POA: Diagnosis not present

## 2017-10-01 DIAGNOSIS — E119 Type 2 diabetes mellitus without complications: Secondary | ICD-10-CM | POA: Diagnosis present

## 2017-10-01 DIAGNOSIS — Z5111 Encounter for antineoplastic chemotherapy: Secondary | ICD-10-CM | POA: Diagnosis not present

## 2017-10-01 DIAGNOSIS — R739 Hyperglycemia, unspecified: Secondary | ICD-10-CM | POA: Diagnosis not present

## 2017-10-07 DIAGNOSIS — E119 Type 2 diabetes mellitus without complications: Secondary | ICD-10-CM | POA: Diagnosis not present

## 2017-10-07 DIAGNOSIS — I951 Orthostatic hypotension: Secondary | ICD-10-CM | POA: Diagnosis not present

## 2017-10-07 DIAGNOSIS — D61810 Antineoplastic chemotherapy induced pancytopenia: Secondary | ICD-10-CM | POA: Diagnosis not present

## 2017-10-07 DIAGNOSIS — I10 Essential (primary) hypertension: Secondary | ICD-10-CM | POA: Diagnosis not present

## 2017-10-07 DIAGNOSIS — C9100 Acute lymphoblastic leukemia not having achieved remission: Secondary | ICD-10-CM | POA: Diagnosis not present

## 2017-10-07 DIAGNOSIS — M6281 Muscle weakness (generalized): Secondary | ICD-10-CM | POA: Diagnosis not present

## 2017-10-10 DIAGNOSIS — I10 Essential (primary) hypertension: Secondary | ICD-10-CM | POA: Diagnosis not present

## 2017-10-10 DIAGNOSIS — C9100 Acute lymphoblastic leukemia not having achieved remission: Secondary | ICD-10-CM | POA: Diagnosis not present

## 2017-10-10 DIAGNOSIS — D61810 Antineoplastic chemotherapy induced pancytopenia: Secondary | ICD-10-CM | POA: Diagnosis not present

## 2017-10-10 DIAGNOSIS — E119 Type 2 diabetes mellitus without complications: Secondary | ICD-10-CM | POA: Diagnosis not present

## 2017-10-10 DIAGNOSIS — I951 Orthostatic hypotension: Secondary | ICD-10-CM | POA: Diagnosis not present

## 2017-10-10 DIAGNOSIS — M6281 Muscle weakness (generalized): Secondary | ICD-10-CM | POA: Diagnosis not present

## 2017-10-11 DIAGNOSIS — E538 Deficiency of other specified B group vitamins: Secondary | ICD-10-CM | POA: Diagnosis not present

## 2017-10-11 DIAGNOSIS — Z5112 Encounter for antineoplastic immunotherapy: Secondary | ICD-10-CM | POA: Diagnosis not present

## 2017-10-11 DIAGNOSIS — C9100 Acute lymphoblastic leukemia not having achieved remission: Secondary | ICD-10-CM | POA: Diagnosis not present

## 2017-10-14 DIAGNOSIS — I951 Orthostatic hypotension: Secondary | ICD-10-CM | POA: Diagnosis not present

## 2017-10-14 DIAGNOSIS — C9100 Acute lymphoblastic leukemia not having achieved remission: Secondary | ICD-10-CM | POA: Diagnosis not present

## 2017-10-14 DIAGNOSIS — E119 Type 2 diabetes mellitus without complications: Secondary | ICD-10-CM | POA: Diagnosis not present

## 2017-10-14 DIAGNOSIS — D61810 Antineoplastic chemotherapy induced pancytopenia: Secondary | ICD-10-CM | POA: Diagnosis not present

## 2017-10-14 DIAGNOSIS — I10 Essential (primary) hypertension: Secondary | ICD-10-CM | POA: Diagnosis not present

## 2017-10-14 DIAGNOSIS — M6281 Muscle weakness (generalized): Secondary | ICD-10-CM | POA: Diagnosis not present

## 2017-10-16 DIAGNOSIS — I10 Essential (primary) hypertension: Secondary | ICD-10-CM | POA: Diagnosis not present

## 2017-10-17 DIAGNOSIS — C9100 Acute lymphoblastic leukemia not having achieved remission: Secondary | ICD-10-CM | POA: Diagnosis not present

## 2017-10-17 DIAGNOSIS — E119 Type 2 diabetes mellitus without complications: Secondary | ICD-10-CM | POA: Diagnosis not present

## 2017-10-17 DIAGNOSIS — D61810 Antineoplastic chemotherapy induced pancytopenia: Secondary | ICD-10-CM | POA: Diagnosis not present

## 2017-10-17 DIAGNOSIS — I951 Orthostatic hypotension: Secondary | ICD-10-CM | POA: Diagnosis not present

## 2017-10-17 DIAGNOSIS — M6281 Muscle weakness (generalized): Secondary | ICD-10-CM | POA: Diagnosis not present

## 2017-10-17 DIAGNOSIS — I10 Essential (primary) hypertension: Secondary | ICD-10-CM | POA: Diagnosis not present

## 2017-10-18 DIAGNOSIS — E0965 Drug or chemical induced diabetes mellitus with hyperglycemia: Secondary | ICD-10-CM | POA: Diagnosis not present

## 2017-10-18 DIAGNOSIS — D709 Neutropenia, unspecified: Secondary | ICD-10-CM | POA: Diagnosis not present

## 2017-10-18 DIAGNOSIS — R000 Tachycardia, unspecified: Secondary | ICD-10-CM | POA: Diagnosis not present

## 2017-10-18 DIAGNOSIS — Z959 Presence of cardiac and vascular implant and graft, unspecified: Secondary | ICD-10-CM | POA: Diagnosis not present

## 2017-10-18 DIAGNOSIS — R739 Hyperglycemia, unspecified: Secondary | ICD-10-CM | POA: Diagnosis not present

## 2017-10-18 DIAGNOSIS — E538 Deficiency of other specified B group vitamins: Secondary | ICD-10-CM | POA: Diagnosis not present

## 2017-10-18 DIAGNOSIS — T380X5A Adverse effect of glucocorticoids and synthetic analogues, initial encounter: Secondary | ICD-10-CM | POA: Diagnosis not present

## 2017-10-18 DIAGNOSIS — Z9484 Stem cells transplant status: Secondary | ICD-10-CM | POA: Diagnosis not present

## 2017-10-18 DIAGNOSIS — C9100 Acute lymphoblastic leukemia not having achieved remission: Secondary | ICD-10-CM | POA: Diagnosis not present

## 2017-10-18 DIAGNOSIS — E785 Hyperlipidemia, unspecified: Secondary | ICD-10-CM | POA: Diagnosis not present

## 2017-10-18 DIAGNOSIS — Z95828 Presence of other vascular implants and grafts: Secondary | ICD-10-CM | POA: Diagnosis not present

## 2017-10-18 DIAGNOSIS — I10 Essential (primary) hypertension: Secondary | ICD-10-CM | POA: Diagnosis not present

## 2017-10-18 DIAGNOSIS — C9001 Multiple myeloma in remission: Secondary | ICD-10-CM | POA: Diagnosis not present

## 2017-10-18 DIAGNOSIS — I951 Orthostatic hypotension: Secondary | ICD-10-CM | POA: Diagnosis not present

## 2017-10-18 DIAGNOSIS — Z5111 Encounter for antineoplastic chemotherapy: Secondary | ICD-10-CM | POA: Diagnosis not present

## 2017-10-18 DIAGNOSIS — T380X5D Adverse effect of glucocorticoids and synthetic analogues, subsequent encounter: Secondary | ICD-10-CM | POA: Diagnosis not present

## 2017-10-18 DIAGNOSIS — C9000 Multiple myeloma not having achieved remission: Secondary | ICD-10-CM | POA: Diagnosis not present

## 2017-10-25 DIAGNOSIS — I10 Essential (primary) hypertension: Secondary | ICD-10-CM | POA: Diagnosis not present

## 2017-10-25 DIAGNOSIS — Z9484 Stem cells transplant status: Secondary | ICD-10-CM | POA: Diagnosis not present

## 2017-10-25 DIAGNOSIS — B49 Unspecified mycosis: Secondary | ICD-10-CM | POA: Diagnosis not present

## 2017-10-25 DIAGNOSIS — C9100 Acute lymphoblastic leukemia not having achieved remission: Secondary | ICD-10-CM | POA: Diagnosis not present

## 2017-10-25 DIAGNOSIS — C9000 Multiple myeloma not having achieved remission: Secondary | ICD-10-CM | POA: Diagnosis not present

## 2017-10-25 DIAGNOSIS — E785 Hyperlipidemia, unspecified: Secondary | ICD-10-CM | POA: Diagnosis not present

## 2017-10-25 DIAGNOSIS — C9001 Multiple myeloma in remission: Secondary | ICD-10-CM | POA: Diagnosis not present

## 2017-10-25 DIAGNOSIS — R000 Tachycardia, unspecified: Secondary | ICD-10-CM | POA: Diagnosis not present

## 2017-10-25 DIAGNOSIS — T380X5A Adverse effect of glucocorticoids and synthetic analogues, initial encounter: Secondary | ICD-10-CM | POA: Diagnosis not present

## 2017-10-25 DIAGNOSIS — R739 Hyperglycemia, unspecified: Secondary | ICD-10-CM | POA: Diagnosis not present

## 2017-10-25 DIAGNOSIS — Z79899 Other long term (current) drug therapy: Secondary | ICD-10-CM | POA: Diagnosis not present

## 2017-10-25 DIAGNOSIS — I951 Orthostatic hypotension: Secondary | ICD-10-CM | POA: Diagnosis not present

## 2017-10-25 DIAGNOSIS — E538 Deficiency of other specified B group vitamins: Secondary | ICD-10-CM | POA: Diagnosis not present

## 2017-10-25 DIAGNOSIS — Z5111 Encounter for antineoplastic chemotherapy: Secondary | ICD-10-CM | POA: Diagnosis not present

## 2017-10-25 DIAGNOSIS — Z794 Long term (current) use of insulin: Secondary | ICD-10-CM | POA: Diagnosis not present

## 2017-10-26 DIAGNOSIS — C9001 Multiple myeloma in remission: Secondary | ICD-10-CM | POA: Diagnosis not present

## 2017-10-26 DIAGNOSIS — C9100 Acute lymphoblastic leukemia not having achieved remission: Secondary | ICD-10-CM | POA: Diagnosis not present

## 2017-10-26 DIAGNOSIS — Z79899 Other long term (current) drug therapy: Secondary | ICD-10-CM | POA: Diagnosis not present

## 2017-10-26 DIAGNOSIS — Z9484 Stem cells transplant status: Secondary | ICD-10-CM | POA: Diagnosis not present

## 2017-10-28 ENCOUNTER — Ambulatory Visit (HOSPITAL_COMMUNITY): Payer: Medicare Other

## 2017-10-28 DIAGNOSIS — C9001 Multiple myeloma in remission: Secondary | ICD-10-CM | POA: Diagnosis not present

## 2017-10-28 DIAGNOSIS — C9100 Acute lymphoblastic leukemia not having achieved remission: Secondary | ICD-10-CM | POA: Diagnosis not present

## 2017-10-28 DIAGNOSIS — Z79899 Other long term (current) drug therapy: Secondary | ICD-10-CM | POA: Diagnosis not present

## 2017-10-28 DIAGNOSIS — Z9484 Stem cells transplant status: Secondary | ICD-10-CM | POA: Diagnosis not present

## 2017-10-29 DIAGNOSIS — R739 Hyperglycemia, unspecified: Secondary | ICD-10-CM | POA: Diagnosis not present

## 2017-10-29 DIAGNOSIS — I10 Essential (primary) hypertension: Secondary | ICD-10-CM | POA: Diagnosis not present

## 2017-10-29 DIAGNOSIS — T380X5D Adverse effect of glucocorticoids and synthetic analogues, subsequent encounter: Secondary | ICD-10-CM | POA: Diagnosis not present

## 2017-10-29 DIAGNOSIS — J168 Pneumonia due to other specified infectious organisms: Secondary | ICD-10-CM | POA: Diagnosis not present

## 2017-10-29 DIAGNOSIS — C9000 Multiple myeloma not having achieved remission: Secondary | ICD-10-CM | POA: Diagnosis not present

## 2017-10-29 DIAGNOSIS — R000 Tachycardia, unspecified: Secondary | ICD-10-CM | POA: Diagnosis not present

## 2017-10-29 DIAGNOSIS — D708 Other neutropenia: Secondary | ICD-10-CM | POA: Diagnosis not present

## 2017-10-29 DIAGNOSIS — Z23 Encounter for immunization: Secondary | ICD-10-CM | POA: Diagnosis not present

## 2017-10-29 DIAGNOSIS — E538 Deficiency of other specified B group vitamins: Secondary | ICD-10-CM | POA: Diagnosis not present

## 2017-10-29 DIAGNOSIS — Z95828 Presence of other vascular implants and grafts: Secondary | ICD-10-CM | POA: Diagnosis not present

## 2017-10-29 DIAGNOSIS — Z5111 Encounter for antineoplastic chemotherapy: Secondary | ICD-10-CM | POA: Diagnosis not present

## 2017-10-29 DIAGNOSIS — I951 Orthostatic hypotension: Secondary | ICD-10-CM | POA: Diagnosis not present

## 2017-10-29 DIAGNOSIS — T380X5A Adverse effect of glucocorticoids and synthetic analogues, initial encounter: Secondary | ICD-10-CM | POA: Diagnosis not present

## 2017-10-29 DIAGNOSIS — E785 Hyperlipidemia, unspecified: Secondary | ICD-10-CM | POA: Diagnosis not present

## 2017-10-29 DIAGNOSIS — D709 Neutropenia, unspecified: Secondary | ICD-10-CM | POA: Diagnosis not present

## 2017-10-29 DIAGNOSIS — Z9484 Stem cells transplant status: Secondary | ICD-10-CM | POA: Diagnosis not present

## 2017-10-29 DIAGNOSIS — C9100 Acute lymphoblastic leukemia not having achieved remission: Secondary | ICD-10-CM | POA: Diagnosis not present

## 2017-10-29 DIAGNOSIS — C9001 Multiple myeloma in remission: Secondary | ICD-10-CM | POA: Diagnosis not present

## 2017-10-29 DIAGNOSIS — Z79899 Other long term (current) drug therapy: Secondary | ICD-10-CM | POA: Diagnosis not present

## 2017-10-29 DIAGNOSIS — C9101 Acute lymphoblastic leukemia, in remission: Secondary | ICD-10-CM | POA: Diagnosis not present

## 2017-11-05 DIAGNOSIS — E785 Hyperlipidemia, unspecified: Secondary | ICD-10-CM | POA: Diagnosis not present

## 2017-11-05 DIAGNOSIS — Z8579 Personal history of other malignant neoplasms of lymphoid, hematopoietic and related tissues: Secondary | ICD-10-CM | POA: Diagnosis not present

## 2017-11-05 DIAGNOSIS — T380X5A Adverse effect of glucocorticoids and synthetic analogues, initial encounter: Secondary | ICD-10-CM | POA: Diagnosis not present

## 2017-11-05 DIAGNOSIS — E538 Deficiency of other specified B group vitamins: Secondary | ICD-10-CM | POA: Diagnosis not present

## 2017-11-05 DIAGNOSIS — C9000 Multiple myeloma not having achieved remission: Secondary | ICD-10-CM | POA: Diagnosis not present

## 2017-11-05 DIAGNOSIS — C9100 Acute lymphoblastic leukemia not having achieved remission: Secondary | ICD-10-CM | POA: Diagnosis not present

## 2017-11-05 DIAGNOSIS — Z5111 Encounter for antineoplastic chemotherapy: Secondary | ICD-10-CM | POA: Diagnosis not present

## 2017-11-05 DIAGNOSIS — I951 Orthostatic hypotension: Secondary | ICD-10-CM | POA: Diagnosis not present

## 2017-11-05 DIAGNOSIS — Z8619 Personal history of other infectious and parasitic diseases: Secondary | ICD-10-CM | POA: Diagnosis not present

## 2017-11-05 DIAGNOSIS — D708 Other neutropenia: Secondary | ICD-10-CM | POA: Diagnosis not present

## 2017-11-05 DIAGNOSIS — R739 Hyperglycemia, unspecified: Secondary | ICD-10-CM | POA: Diagnosis not present

## 2017-11-05 DIAGNOSIS — I10 Essential (primary) hypertension: Secondary | ICD-10-CM | POA: Diagnosis not present

## 2017-11-07 DIAGNOSIS — E119 Type 2 diabetes mellitus without complications: Secondary | ICD-10-CM | POA: Diagnosis not present

## 2017-11-07 DIAGNOSIS — I10 Essential (primary) hypertension: Secondary | ICD-10-CM | POA: Diagnosis not present

## 2017-11-08 DIAGNOSIS — Z005 Encounter for examination of potential donor of organ and tissue: Secondary | ICD-10-CM | POA: Diagnosis not present

## 2017-11-15 DIAGNOSIS — Z5111 Encounter for antineoplastic chemotherapy: Secondary | ICD-10-CM | POA: Diagnosis not present

## 2017-11-15 DIAGNOSIS — R000 Tachycardia, unspecified: Secondary | ICD-10-CM | POA: Diagnosis present

## 2017-11-15 DIAGNOSIS — Z95828 Presence of other vascular implants and grafts: Secondary | ICD-10-CM | POA: Diagnosis not present

## 2017-11-15 DIAGNOSIS — I10 Essential (primary) hypertension: Secondary | ICD-10-CM | POA: Diagnosis present

## 2017-11-15 DIAGNOSIS — E785 Hyperlipidemia, unspecified: Secondary | ICD-10-CM | POA: Diagnosis present

## 2017-11-15 DIAGNOSIS — J168 Pneumonia due to other specified infectious organisms: Secondary | ICD-10-CM | POA: Diagnosis present

## 2017-11-15 DIAGNOSIS — Z8579 Personal history of other malignant neoplasms of lymphoid, hematopoietic and related tissues: Secondary | ICD-10-CM | POA: Diagnosis not present

## 2017-11-15 DIAGNOSIS — D61810 Antineoplastic chemotherapy induced pancytopenia: Secondary | ICD-10-CM | POA: Diagnosis present

## 2017-11-15 DIAGNOSIS — D61818 Other pancytopenia: Secondary | ICD-10-CM | POA: Diagnosis present

## 2017-11-15 DIAGNOSIS — C9100 Acute lymphoblastic leukemia not having achieved remission: Secondary | ICD-10-CM | POA: Diagnosis not present

## 2017-11-15 DIAGNOSIS — T451X5A Adverse effect of antineoplastic and immunosuppressive drugs, initial encounter: Secondary | ICD-10-CM | POA: Diagnosis present

## 2017-11-15 DIAGNOSIS — C9101 Acute lymphoblastic leukemia, in remission: Secondary | ICD-10-CM | POA: Diagnosis present

## 2017-11-15 DIAGNOSIS — Z794 Long term (current) use of insulin: Secondary | ICD-10-CM | POA: Diagnosis not present

## 2017-11-15 DIAGNOSIS — B488 Other specified mycoses: Secondary | ICD-10-CM | POA: Diagnosis present

## 2017-11-15 DIAGNOSIS — I4510 Unspecified right bundle-branch block: Secondary | ICD-10-CM | POA: Diagnosis not present

## 2017-11-15 DIAGNOSIS — E119 Type 2 diabetes mellitus without complications: Secondary | ICD-10-CM | POA: Diagnosis present

## 2017-11-20 DIAGNOSIS — C9100 Acute lymphoblastic leukemia not having achieved remission: Secondary | ICD-10-CM | POA: Diagnosis not present

## 2017-11-20 DIAGNOSIS — Z5111 Encounter for antineoplastic chemotherapy: Secondary | ICD-10-CM | POA: Diagnosis not present

## 2017-11-22 DIAGNOSIS — Z959 Presence of cardiac and vascular implant and graft, unspecified: Secondary | ICD-10-CM | POA: Diagnosis not present

## 2017-11-22 DIAGNOSIS — C9101 Acute lymphoblastic leukemia, in remission: Secondary | ICD-10-CM | POA: Diagnosis not present

## 2017-11-22 DIAGNOSIS — Z79899 Other long term (current) drug therapy: Secondary | ICD-10-CM | POA: Diagnosis not present

## 2017-11-22 DIAGNOSIS — C9100 Acute lymphoblastic leukemia not having achieved remission: Secondary | ICD-10-CM | POA: Diagnosis not present

## 2017-11-25 DIAGNOSIS — E538 Deficiency of other specified B group vitamins: Secondary | ICD-10-CM | POA: Diagnosis not present

## 2017-11-25 DIAGNOSIS — E119 Type 2 diabetes mellitus without complications: Secondary | ICD-10-CM | POA: Diagnosis not present

## 2017-11-25 DIAGNOSIS — C9100 Acute lymphoblastic leukemia not having achieved remission: Secondary | ICD-10-CM | POA: Diagnosis not present

## 2017-11-28 ENCOUNTER — Ambulatory Visit (HOSPITAL_COMMUNITY): Payer: Medicare Other

## 2017-11-29 DIAGNOSIS — C9100 Acute lymphoblastic leukemia not having achieved remission: Secondary | ICD-10-CM | POA: Diagnosis not present

## 2017-11-29 DIAGNOSIS — E785 Hyperlipidemia, unspecified: Secondary | ICD-10-CM | POA: Diagnosis not present

## 2017-11-29 DIAGNOSIS — R739 Hyperglycemia, unspecified: Secondary | ICD-10-CM | POA: Diagnosis not present

## 2017-12-06 DIAGNOSIS — I10 Essential (primary) hypertension: Secondary | ICD-10-CM | POA: Diagnosis not present

## 2017-12-06 DIAGNOSIS — D61818 Other pancytopenia: Secondary | ICD-10-CM | POA: Diagnosis not present

## 2017-12-06 DIAGNOSIS — R000 Tachycardia, unspecified: Secondary | ICD-10-CM | POA: Diagnosis not present

## 2017-12-06 DIAGNOSIS — Z959 Presence of cardiac and vascular implant and graft, unspecified: Secondary | ICD-10-CM | POA: Diagnosis not present

## 2017-12-06 DIAGNOSIS — E538 Deficiency of other specified B group vitamins: Secondary | ICD-10-CM | POA: Diagnosis not present

## 2017-12-06 DIAGNOSIS — R739 Hyperglycemia, unspecified: Secondary | ICD-10-CM | POA: Diagnosis not present

## 2017-12-06 DIAGNOSIS — Z5111 Encounter for antineoplastic chemotherapy: Secondary | ICD-10-CM | POA: Diagnosis not present

## 2017-12-06 DIAGNOSIS — C9101 Acute lymphoblastic leukemia, in remission: Secondary | ICD-10-CM | POA: Diagnosis not present

## 2017-12-06 DIAGNOSIS — E785 Hyperlipidemia, unspecified: Secondary | ICD-10-CM | POA: Diagnosis not present

## 2017-12-06 DIAGNOSIS — C9100 Acute lymphoblastic leukemia not having achieved remission: Secondary | ICD-10-CM | POA: Diagnosis not present

## 2017-12-06 DIAGNOSIS — D708 Other neutropenia: Secondary | ICD-10-CM | POA: Diagnosis not present

## 2017-12-06 DIAGNOSIS — R918 Other nonspecific abnormal finding of lung field: Secondary | ICD-10-CM | POA: Diagnosis not present

## 2017-12-06 DIAGNOSIS — T380X1A Poisoning by glucocorticoids and synthetic analogues, accidental (unintentional), initial encounter: Secondary | ICD-10-CM | POA: Diagnosis not present

## 2017-12-06 DIAGNOSIS — I951 Orthostatic hypotension: Secondary | ICD-10-CM | POA: Diagnosis not present

## 2017-12-13 DIAGNOSIS — E785 Hyperlipidemia, unspecified: Secondary | ICD-10-CM | POA: Diagnosis not present

## 2017-12-13 DIAGNOSIS — R000 Tachycardia, unspecified: Secondary | ICD-10-CM | POA: Diagnosis not present

## 2017-12-13 DIAGNOSIS — E1165 Type 2 diabetes mellitus with hyperglycemia: Secondary | ICD-10-CM | POA: Diagnosis not present

## 2017-12-13 DIAGNOSIS — C9101 Acute lymphoblastic leukemia, in remission: Secondary | ICD-10-CM | POA: Diagnosis not present

## 2017-12-13 DIAGNOSIS — E0965 Drug or chemical induced diabetes mellitus with hyperglycemia: Secondary | ICD-10-CM | POA: Diagnosis not present

## 2017-12-13 DIAGNOSIS — Z794 Long term (current) use of insulin: Secondary | ICD-10-CM | POA: Diagnosis not present

## 2017-12-13 DIAGNOSIS — C9000 Multiple myeloma not having achieved remission: Secondary | ICD-10-CM | POA: Diagnosis not present

## 2017-12-13 DIAGNOSIS — R21 Rash and other nonspecific skin eruption: Secondary | ICD-10-CM | POA: Diagnosis not present

## 2017-12-13 DIAGNOSIS — Z95828 Presence of other vascular implants and grafts: Secondary | ICD-10-CM | POA: Diagnosis not present

## 2017-12-13 DIAGNOSIS — T380X5A Adverse effect of glucocorticoids and synthetic analogues, initial encounter: Secondary | ICD-10-CM | POA: Diagnosis not present

## 2017-12-13 DIAGNOSIS — E538 Deficiency of other specified B group vitamins: Secondary | ICD-10-CM | POA: Diagnosis not present

## 2017-12-17 DIAGNOSIS — C9100 Acute lymphoblastic leukemia not having achieved remission: Secondary | ICD-10-CM | POA: Diagnosis not present

## 2017-12-17 DIAGNOSIS — C9101 Acute lymphoblastic leukemia, in remission: Secondary | ICD-10-CM | POA: Diagnosis not present

## 2017-12-17 DIAGNOSIS — Z79899 Other long term (current) drug therapy: Secondary | ICD-10-CM | POA: Diagnosis not present

## 2017-12-17 DIAGNOSIS — E785 Hyperlipidemia, unspecified: Secondary | ICD-10-CM | POA: Diagnosis not present

## 2017-12-17 DIAGNOSIS — Z9484 Stem cells transplant status: Secondary | ICD-10-CM | POA: Diagnosis not present

## 2017-12-17 DIAGNOSIS — R000 Tachycardia, unspecified: Secondary | ICD-10-CM | POA: Diagnosis not present

## 2017-12-17 DIAGNOSIS — E538 Deficiency of other specified B group vitamins: Secondary | ICD-10-CM | POA: Diagnosis not present

## 2017-12-17 DIAGNOSIS — C9000 Multiple myeloma not having achieved remission: Secondary | ICD-10-CM | POA: Diagnosis not present

## 2017-12-17 DIAGNOSIS — E1165 Type 2 diabetes mellitus with hyperglycemia: Secondary | ICD-10-CM | POA: Diagnosis not present

## 2017-12-17 DIAGNOSIS — R739 Hyperglycemia, unspecified: Secondary | ICD-10-CM | POA: Diagnosis not present

## 2017-12-27 ENCOUNTER — Ambulatory Visit (HOSPITAL_COMMUNITY): Payer: Medicare Other

## 2017-12-27 DIAGNOSIS — C9101 Acute lymphoblastic leukemia, in remission: Secondary | ICD-10-CM | POA: Diagnosis not present

## 2017-12-27 DIAGNOSIS — E785 Hyperlipidemia, unspecified: Secondary | ICD-10-CM | POA: Diagnosis not present

## 2017-12-27 DIAGNOSIS — D696 Thrombocytopenia, unspecified: Secondary | ICD-10-CM | POA: Diagnosis not present

## 2017-12-27 DIAGNOSIS — I10 Essential (primary) hypertension: Secondary | ICD-10-CM | POA: Diagnosis not present

## 2017-12-27 DIAGNOSIS — E538 Deficiency of other specified B group vitamins: Secondary | ICD-10-CM | POA: Diagnosis not present

## 2017-12-27 DIAGNOSIS — Z95828 Presence of other vascular implants and grafts: Secondary | ICD-10-CM | POA: Diagnosis not present

## 2017-12-27 DIAGNOSIS — R739 Hyperglycemia, unspecified: Secondary | ICD-10-CM | POA: Diagnosis not present

## 2017-12-27 DIAGNOSIS — T380X5D Adverse effect of glucocorticoids and synthetic analogues, subsequent encounter: Secondary | ICD-10-CM | POA: Diagnosis not present

## 2017-12-27 DIAGNOSIS — Z79899 Other long term (current) drug therapy: Secondary | ICD-10-CM | POA: Diagnosis not present

## 2017-12-27 DIAGNOSIS — R000 Tachycardia, unspecified: Secondary | ICD-10-CM | POA: Diagnosis not present

## 2017-12-27 DIAGNOSIS — I951 Orthostatic hypotension: Secondary | ICD-10-CM | POA: Diagnosis not present

## 2017-12-27 DIAGNOSIS — C9000 Multiple myeloma not having achieved remission: Secondary | ICD-10-CM | POA: Diagnosis not present

## 2017-12-27 DIAGNOSIS — C9100 Acute lymphoblastic leukemia not having achieved remission: Secondary | ICD-10-CM | POA: Diagnosis not present

## 2017-12-31 DIAGNOSIS — Z0000 Encounter for general adult medical examination without abnormal findings: Secondary | ICD-10-CM | POA: Diagnosis not present

## 2017-12-31 DIAGNOSIS — E785 Hyperlipidemia, unspecified: Secondary | ICD-10-CM | POA: Diagnosis not present

## 2017-12-31 DIAGNOSIS — E119 Type 2 diabetes mellitus without complications: Secondary | ICD-10-CM | POA: Diagnosis not present

## 2017-12-31 DIAGNOSIS — I10 Essential (primary) hypertension: Secondary | ICD-10-CM | POA: Diagnosis not present

## 2017-12-31 DIAGNOSIS — C9100 Acute lymphoblastic leukemia not having achieved remission: Secondary | ICD-10-CM | POA: Diagnosis not present

## 2017-12-31 DIAGNOSIS — Z1331 Encounter for screening for depression: Secondary | ICD-10-CM | POA: Diagnosis not present

## 2017-12-31 DIAGNOSIS — C9001 Multiple myeloma in remission: Secondary | ICD-10-CM | POA: Diagnosis not present

## 2017-12-31 DIAGNOSIS — Z1389 Encounter for screening for other disorder: Secondary | ICD-10-CM | POA: Diagnosis not present

## 2018-01-02 ENCOUNTER — Other Ambulatory Visit: Payer: Self-pay

## 2018-01-02 ENCOUNTER — Encounter (HOSPITAL_COMMUNITY): Payer: Self-pay

## 2018-01-02 ENCOUNTER — Inpatient Hospital Stay (HOSPITAL_COMMUNITY): Payer: Medicare Other | Attending: Hematology

## 2018-01-02 DIAGNOSIS — C9001 Multiple myeloma in remission: Secondary | ICD-10-CM | POA: Insufficient documentation

## 2018-01-02 DIAGNOSIS — Z452 Encounter for adjustment and management of vascular access device: Secondary | ICD-10-CM | POA: Insufficient documentation

## 2018-01-02 MED ORDER — SODIUM CHLORIDE 0.9% FLUSH
10.0000 mL | INTRAVENOUS | Status: DC | PRN
  Administered 2018-01-02: 10 mL via INTRAVENOUS
  Filled 2018-01-02: qty 10

## 2018-01-02 MED ORDER — HEPARIN SOD (PORK) LOCK FLUSH 100 UNIT/ML IV SOLN: 500.0000 [IU] | Freq: Once | INTRAVENOUS | Status: DC

## 2018-01-02 NOTE — Progress Notes (Signed)
Patient was a walk in. He is our patient. However, he is a current patient @ [facility] going through prep for bone marrow transplant in January 2020. He came in stating that it felt like his port didn't feel right. He said he hiccupped through the night and around 8am the needle/port area starting not feeling right. We scheduled him for an appointment and assessed the port site. Port needle was not in the port. Patient has a double port on right chest. It appeared that the right port was the one most recently accessed. Upon assessment, the needle was 2 inches below the right port and sticking in his skin. Scant amount (drip) of drug, Blincyto, on skin around needle. Absorbent spill pad used to absorb scant (drip of) drainage. Area cleaned with soap and water. Site without redness, swelling, or tenderness. Site tissue the same color as surrounding skin. Patient has no complaints. [place] Lidia Collum NP with Dr. Florene Glen) notified of situation and she said to reaccess port and start pump. Patient to follow [facility] with [facility] tomorrow.  Left port accessed with 20 g huber needle. Blood return noted. Patient reconnected to ambulatory pump and previous settings resumed @ .6 ml per hour.   Mirna Mires presented for Portacath access and flush. Double Portacath located rt chest wall (left port) accessed with  H 20 needle. Good blood return present. Portacath flushed with 92ml NS. Procedure without incident. Patient tolerated procedure well.   Patient taking Sprycel everyday and hasn't missed any doses. Patient also reports no side effects from Sprycel.

## 2018-01-03 DIAGNOSIS — R9431 Abnormal electrocardiogram [ECG] [EKG]: Secondary | ICD-10-CM | POA: Diagnosis not present

## 2018-01-03 DIAGNOSIS — C9000 Multiple myeloma not having achieved remission: Secondary | ICD-10-CM | POA: Diagnosis not present

## 2018-01-03 DIAGNOSIS — E785 Hyperlipidemia, unspecified: Secondary | ICD-10-CM | POA: Diagnosis not present

## 2018-01-03 DIAGNOSIS — M6281 Muscle weakness (generalized): Secondary | ICD-10-CM | POA: Diagnosis not present

## 2018-01-03 DIAGNOSIS — R000 Tachycardia, unspecified: Secondary | ICD-10-CM | POA: Diagnosis not present

## 2018-01-03 DIAGNOSIS — Z87898 Personal history of other specified conditions: Secondary | ICD-10-CM | POA: Diagnosis not present

## 2018-01-03 DIAGNOSIS — I951 Orthostatic hypotension: Secondary | ICD-10-CM | POA: Diagnosis not present

## 2018-01-03 DIAGNOSIS — Z95828 Presence of other vascular implants and grafts: Secondary | ICD-10-CM | POA: Diagnosis not present

## 2018-01-03 DIAGNOSIS — T380X5A Adverse effect of glucocorticoids and synthetic analogues, initial encounter: Secondary | ICD-10-CM | POA: Diagnosis not present

## 2018-01-03 DIAGNOSIS — C9101 Acute lymphoblastic leukemia, in remission: Secondary | ICD-10-CM | POA: Diagnosis not present

## 2018-01-03 DIAGNOSIS — R739 Hyperglycemia, unspecified: Secondary | ICD-10-CM | POA: Diagnosis not present

## 2018-01-03 DIAGNOSIS — Z79899 Other long term (current) drug therapy: Secondary | ICD-10-CM | POA: Diagnosis not present

## 2018-01-03 DIAGNOSIS — C9100 Acute lymphoblastic leukemia not having achieved remission: Secondary | ICD-10-CM | POA: Diagnosis not present

## 2018-01-10 DIAGNOSIS — E785 Hyperlipidemia, unspecified: Secondary | ICD-10-CM | POA: Diagnosis not present

## 2018-01-10 DIAGNOSIS — C9100 Acute lymphoblastic leukemia not having achieved remission: Secondary | ICD-10-CM | POA: Diagnosis not present

## 2018-01-10 DIAGNOSIS — I10 Essential (primary) hypertension: Secondary | ICD-10-CM | POA: Diagnosis not present

## 2018-01-10 DIAGNOSIS — C9001 Multiple myeloma in remission: Secondary | ICD-10-CM | POA: Diagnosis not present

## 2018-01-10 DIAGNOSIS — Z79899 Other long term (current) drug therapy: Secondary | ICD-10-CM | POA: Diagnosis not present

## 2018-01-10 DIAGNOSIS — R9431 Abnormal electrocardiogram [ECG] [EKG]: Secondary | ICD-10-CM | POA: Diagnosis not present

## 2018-01-10 DIAGNOSIS — T380X5A Adverse effect of glucocorticoids and synthetic analogues, initial encounter: Secondary | ICD-10-CM | POA: Diagnosis not present

## 2018-01-10 DIAGNOSIS — E538 Deficiency of other specified B group vitamins: Secondary | ICD-10-CM | POA: Diagnosis not present

## 2018-01-10 DIAGNOSIS — R739 Hyperglycemia, unspecified: Secondary | ICD-10-CM | POA: Diagnosis not present

## 2018-01-10 DIAGNOSIS — C9101 Acute lymphoblastic leukemia, in remission: Secondary | ICD-10-CM | POA: Diagnosis not present

## 2018-01-10 DIAGNOSIS — Z959 Presence of cardiac and vascular implant and graft, unspecified: Secondary | ICD-10-CM | POA: Diagnosis not present

## 2018-01-10 DIAGNOSIS — I951 Orthostatic hypotension: Secondary | ICD-10-CM | POA: Diagnosis not present

## 2018-01-10 DIAGNOSIS — R000 Tachycardia, unspecified: Secondary | ICD-10-CM | POA: Diagnosis not present

## 2018-01-10 DIAGNOSIS — Z5111 Encounter for antineoplastic chemotherapy: Secondary | ICD-10-CM | POA: Diagnosis not present

## 2018-01-17 DIAGNOSIS — E538 Deficiency of other specified B group vitamins: Secondary | ICD-10-CM | POA: Diagnosis not present

## 2018-01-17 DIAGNOSIS — Z8579 Personal history of other malignant neoplasms of lymphoid, hematopoietic and related tissues: Secondary | ICD-10-CM | POA: Diagnosis not present

## 2018-01-17 DIAGNOSIS — Z79899 Other long term (current) drug therapy: Secondary | ICD-10-CM | POA: Diagnosis not present

## 2018-01-17 DIAGNOSIS — C9101 Acute lymphoblastic leukemia, in remission: Secondary | ICD-10-CM | POA: Diagnosis not present

## 2018-01-17 DIAGNOSIS — R739 Hyperglycemia, unspecified: Secondary | ICD-10-CM | POA: Diagnosis not present

## 2018-01-17 DIAGNOSIS — E785 Hyperlipidemia, unspecified: Secondary | ICD-10-CM | POA: Diagnosis not present

## 2018-01-17 DIAGNOSIS — I951 Orthostatic hypotension: Secondary | ICD-10-CM | POA: Diagnosis not present

## 2018-01-17 DIAGNOSIS — Z9484 Stem cells transplant status: Secondary | ICD-10-CM | POA: Diagnosis not present

## 2018-01-17 DIAGNOSIS — C9100 Acute lymphoblastic leukemia not having achieved remission: Secondary | ICD-10-CM | POA: Diagnosis not present

## 2018-01-17 DIAGNOSIS — T380X5D Adverse effect of glucocorticoids and synthetic analogues, subsequent encounter: Secondary | ICD-10-CM | POA: Diagnosis not present

## 2018-01-17 DIAGNOSIS — Z95828 Presence of other vascular implants and grafts: Secondary | ICD-10-CM | POA: Diagnosis not present

## 2018-01-17 DIAGNOSIS — R000 Tachycardia, unspecified: Secondary | ICD-10-CM | POA: Diagnosis not present

## 2018-01-24 DIAGNOSIS — Z794 Long term (current) use of insulin: Secondary | ICD-10-CM | POA: Diagnosis not present

## 2018-01-24 DIAGNOSIS — I10 Essential (primary) hypertension: Secondary | ICD-10-CM | POA: Diagnosis not present

## 2018-01-24 DIAGNOSIS — R601 Generalized edema: Secondary | ICD-10-CM | POA: Diagnosis not present

## 2018-01-24 DIAGNOSIS — E785 Hyperlipidemia, unspecified: Secondary | ICD-10-CM | POA: Diagnosis not present

## 2018-01-24 DIAGNOSIS — D518 Other vitamin B12 deficiency anemias: Secondary | ICD-10-CM | POA: Diagnosis not present

## 2018-01-24 DIAGNOSIS — Z95828 Presence of other vascular implants and grafts: Secondary | ICD-10-CM | POA: Diagnosis not present

## 2018-01-24 DIAGNOSIS — I951 Orthostatic hypotension: Secondary | ICD-10-CM | POA: Diagnosis not present

## 2018-01-24 DIAGNOSIS — Z8579 Personal history of other malignant neoplasms of lymphoid, hematopoietic and related tissues: Secondary | ICD-10-CM | POA: Diagnosis not present

## 2018-01-24 DIAGNOSIS — C9101 Acute lymphoblastic leukemia, in remission: Secondary | ICD-10-CM | POA: Diagnosis not present

## 2018-01-24 DIAGNOSIS — C9001 Multiple myeloma in remission: Secondary | ICD-10-CM | POA: Diagnosis not present

## 2018-01-24 DIAGNOSIS — Z79899 Other long term (current) drug therapy: Secondary | ICD-10-CM | POA: Diagnosis not present

## 2018-01-24 DIAGNOSIS — E1165 Type 2 diabetes mellitus with hyperglycemia: Secondary | ICD-10-CM | POA: Diagnosis not present

## 2018-01-24 DIAGNOSIS — C9100 Acute lymphoblastic leukemia not having achieved remission: Secondary | ICD-10-CM | POA: Diagnosis not present

## 2018-01-24 DIAGNOSIS — R739 Hyperglycemia, unspecified: Secondary | ICD-10-CM | POA: Diagnosis not present

## 2018-01-24 DIAGNOSIS — R740 Nonspecific elevation of levels of transaminase and lactic acid dehydrogenase [LDH]: Secondary | ICD-10-CM | POA: Diagnosis not present

## 2018-01-24 DIAGNOSIS — R000 Tachycardia, unspecified: Secondary | ICD-10-CM | POA: Diagnosis not present

## 2018-01-27 ENCOUNTER — Ambulatory Visit (HOSPITAL_COMMUNITY): Payer: Medicare Other | Admitting: Hematology

## 2018-01-27 ENCOUNTER — Inpatient Hospital Stay (HOSPITAL_COMMUNITY): Payer: Medicare Other | Attending: Hematology

## 2018-01-27 DIAGNOSIS — C9001 Multiple myeloma in remission: Secondary | ICD-10-CM | POA: Insufficient documentation

## 2018-01-27 DIAGNOSIS — C9100 Acute lymphoblastic leukemia not having achieved remission: Secondary | ICD-10-CM | POA: Insufficient documentation

## 2018-01-27 LAB — CBC WITH DIFFERENTIAL/PLATELET
ABS IMMATURE GRANULOCYTES: 0.04 10*3/uL (ref 0.00–0.07)
BASOS ABS: 0.1 10*3/uL (ref 0.0–0.1)
Basophils Relative: 1 %
EOS PCT: 4 %
Eosinophils Absolute: 0.2 10*3/uL (ref 0.0–0.5)
HCT: 31.1 % — ABNORMAL LOW (ref 39.0–52.0)
Hemoglobin: 10.7 g/dL — ABNORMAL LOW (ref 13.0–17.0)
Immature Granulocytes: 1 %
LYMPHS PCT: 17 %
Lymphs Abs: 0.9 10*3/uL (ref 0.7–4.0)
MCH: 37 pg — AB (ref 26.0–34.0)
MCHC: 34.4 g/dL (ref 30.0–36.0)
MCV: 107.6 fL — ABNORMAL HIGH (ref 80.0–100.0)
MONO ABS: 0.4 10*3/uL (ref 0.1–1.0)
Monocytes Relative: 8 %
NEUTROS ABS: 3.8 10*3/uL (ref 1.7–7.7)
NRBC: 0 % (ref 0.0–0.2)
Neutrophils Relative %: 69 %
Platelets: 159 10*3/uL (ref 150–400)
RBC: 2.89 MIL/uL — AB (ref 4.22–5.81)
RDW: 14.8 % (ref 11.5–15.5)
WBC: 5.4 10*3/uL (ref 4.0–10.5)

## 2018-01-27 LAB — COMPREHENSIVE METABOLIC PANEL
ALBUMIN: 4.2 g/dL (ref 3.5–5.0)
ALK PHOS: 87 U/L (ref 38–126)
ALT: 42 U/L (ref 0–44)
AST: 37 U/L (ref 15–41)
Anion gap: 10 (ref 5–15)
BILIRUBIN TOTAL: 0.5 mg/dL (ref 0.3–1.2)
BUN: 16 mg/dL (ref 6–20)
CALCIUM: 9 mg/dL (ref 8.9–10.3)
CO2: 23 mmol/L (ref 22–32)
CREATININE: 0.89 mg/dL (ref 0.61–1.24)
Chloride: 102 mmol/L (ref 98–111)
GFR calc non Af Amer: >60 mL/min (ref >60)
Glucose, Bld: 158 mg/dL — ABNORMAL HIGH (ref 70–99)
Potassium: 4 mmol/L (ref 3.5–5.1)
SODIUM: 135 mmol/L (ref 135–145)
Total Protein: 6.7 g/dL (ref 6.5–8.1)

## 2018-01-27 LAB — MAGNESIUM: Magnesium: 1.7 mg/dL (ref 1.7–2.4)

## 2018-01-28 DIAGNOSIS — J984 Other disorders of lung: Secondary | ICD-10-CM | POA: Diagnosis not present

## 2018-01-28 DIAGNOSIS — R918 Other nonspecific abnormal finding of lung field: Secondary | ICD-10-CM | POA: Diagnosis not present

## 2018-01-28 DIAGNOSIS — R0609 Other forms of dyspnea: Secondary | ICD-10-CM | POA: Diagnosis not present

## 2018-01-28 DIAGNOSIS — Z0189 Encounter for other specified special examinations: Secondary | ICD-10-CM | POA: Diagnosis not present

## 2018-01-28 DIAGNOSIS — C9101 Acute lymphoblastic leukemia, in remission: Secondary | ICD-10-CM | POA: Diagnosis not present

## 2018-01-28 DIAGNOSIS — I083 Combined rheumatic disorders of mitral, aortic and tricuspid valves: Secondary | ICD-10-CM | POA: Diagnosis not present

## 2018-01-29 DIAGNOSIS — E119 Type 2 diabetes mellitus without complications: Secondary | ICD-10-CM | POA: Diagnosis not present

## 2018-02-03 ENCOUNTER — Inpatient Hospital Stay (HOSPITAL_COMMUNITY): Payer: Medicare Other | Attending: Hematology | Admitting: Hematology

## 2018-02-03 ENCOUNTER — Encounter (HOSPITAL_COMMUNITY): Payer: Self-pay | Admitting: Hematology

## 2018-02-03 ENCOUNTER — Other Ambulatory Visit: Payer: Self-pay

## 2018-02-03 VITALS — BP 148/82 | HR 13 | Temp 97.9°F | Resp 20 | Wt 211.2 lb

## 2018-02-03 DIAGNOSIS — Z794 Long term (current) use of insulin: Secondary | ICD-10-CM | POA: Diagnosis not present

## 2018-02-03 DIAGNOSIS — C9001 Multiple myeloma in remission: Secondary | ICD-10-CM

## 2018-02-03 DIAGNOSIS — Z9484 Stem cells transplant status: Secondary | ICD-10-CM

## 2018-02-03 DIAGNOSIS — I10 Essential (primary) hypertension: Secondary | ICD-10-CM | POA: Diagnosis not present

## 2018-02-03 DIAGNOSIS — E119 Type 2 diabetes mellitus without complications: Secondary | ICD-10-CM | POA: Diagnosis not present

## 2018-02-03 DIAGNOSIS — E538 Deficiency of other specified B group vitamins: Secondary | ICD-10-CM

## 2018-02-03 DIAGNOSIS — C9100 Acute lymphoblastic leukemia not having achieved remission: Secondary | ICD-10-CM

## 2018-02-03 DIAGNOSIS — Z79899 Other long term (current) drug therapy: Secondary | ICD-10-CM | POA: Diagnosis not present

## 2018-02-03 NOTE — Assessment & Plan Note (Signed)
1.  IgG kappa multiple myeloma, stage III: -VRD x4 cycles followed by auto HSCT on 08/07/2012. -he was on Revlimid maintenance 15 mg daily. -Unfortunately he developed B cell ALL.  2.  B-cell ALL: -FISH negative for t(9;22) but ALL fusion profile positive for BCR/ABL (P190) fusion. -Status post induction according to CALGB 12379 regimen with recovery marrow on 08/13/2017 consistent with remission. -Brief period of POMP maintenance therapy followed by Blincyto on 10/01/2017 plus Sprycel 20 mg (reduced dose secondary to concomitant Vfend and Blincyto) -Underwent bone marrow biopsy on 01/24/2018 - He is being worked up for allogenic transplant.  His youngest brother was apparently matched. -He follows up with [facility]ius [facility] at [facility]. -We reviewed blood work from last week.  No gross abnormalities. -We will see him back in 6 months for follow-up.

## 2018-02-03 NOTE — Progress Notes (Signed)
[place], [place] [id number]   CLINIC:  Medical Oncology/Hematology  PCP:  [name], MD [place] [address] [phone]   REASON FOR VISIT: Follow-up for IgG kappa multiple myeloma s/p autologous stem cell transplant (2014) AND vitamin B12 deficiency AND ALL  CURRENT THERAPY: Observation and vitamin B12 inj monthly AND going for transplant  BRIEF ONCOLOGIC HISTORY:    Multiple myeloma in remission ([place])   08/07/2012 Bone Marrow Transplant    Bone marrow transplant at [facility]:  Mr. Poffenberger 58 y.o. male returns for routine follow-up for ALL. He is doing good with no complaints at this time. Denies any nausea, vomiting, or diarrhea. Denies any new pains. Had not noticed any recent bleeding such as epistaxis, hematuria or hematochezia. Denies recent chest pain on exertion, shortness of breath on minimal exertion, pre-syncopal episodes, or palpitations. Denies any numbness or tingling in hands or feet. Denies any recent fevers, infections, or recent hospitalizations.  He reports his appetite and energy level at 100%. He is maintaining his weight well at this time.     REVIEW OF SYSTEMS:  Review of Systems  All other systems reviewed and are negative.    PAST MEDICAL/SURGICAL HISTORY:  Past Medical History:  Diagnosis Date  . B12 deficiency 10/23/2015  . Cancer ([place])   . Diabetes mellitus without complication ([place])   . HTN (hypertension)   . Multiple myeloma ([place])   . Multiple myeloma in remission ([place]) 07/30/2015  . Pneumonia   . Sepsis(995.91)   . Stem cells transplant status [facility])    Past Surgical History:  Procedure Laterality Date  . LIMBAL STEM CELL TRANSPLANT    . NOSE SURGERY     "when I was a child"     SOCIAL HISTORY:  Social History   Socioeconomic History  . Marital status: Divorced    Spouse name: Not on file  . Number of children: Not on file  . Years of  education: Not on file  . Highest education level: Not on file  Occupational History  . Not on file  Social Needs  . Financial resource strain: Not on file  . Food insecurity:    Worry: Not on file    Inability: Not on file  . Transportation needs:    Medical: Not on file    Non-medical: Not on file  Tobacco Use  . Smoking status: Never Smoker  . Smokeless tobacco: Never Used  Substance and Sexual Activity  . Alcohol use: No    Comment: occasional  . Drug use: No  . Sexual activity: Yes    Birth control/protection: None  Lifestyle  . Physical activity:    Days per week: Not on file    Minutes per session: Not on file  . Stress: Not on file  Relationships  . Social connections:    Talks on phone: Not on file    Gets together: Not on file    Attends religious service: Not on file    Active member of club or organization: Not on file    Attends meetings of clubs or organizations: Not on file    Relationship status: Not on file  . Intimate partner violence:    Fear of current or ex partner: Not on file    Emotionally abused: Not on file    Physically abused: Not on file    Forced sexual activity: Not on file  Other Topics Concern  . Not on file  Social History Narrative  . Not on file    FAMILY HISTORY:  History reviewed. No pertinent family history.  CURRENT MEDICATIONS:  Outpatient Encounter Medications as of 02/03/2018  Medication Sig  . Calcium Carbonate-Vitamin D3 600-400 MG-UNIT TABS Take 1,500 mg by mouth 2 (two) times daily.  . dasatinib (SPRYCEL) 20 MG tablet Take by mouth.  . insulin glargine (LANTUS) 100 UNIT/ML injection Inject 20 Units into the skin every evening.  . insulin lispro (HUMALOG KWIKPEN) 100 UNIT/ML KiwkPen Give per sliding scale  . magnesium oxide (MAG-OX) 400 MG tablet Take 400 mg by mouth 2 (two) times daily.   . metFORMIN (GLUCOPHAGE) 1000 MG tablet Take by mouth.  . Multiple Vitamin (MULTIVITAMIN WITH MINERALS) TABS tablet Take 1  tablet by mouth daily.  . simvastatin (ZOCOR) 20 MG tablet **HOLD** until follow up with your physician. Previous instructions: Take 1 tablet (20 mg total) by mouth daily.  Marland Kitchen voriconazole (VFEND) 200 MG tablet Take one tablet (242m) by mouth twice daily  . albuterol (PROVENTIL HFA;VENTOLIN HFA) 108 (90 Base) MCG/ACT inhaler Inhale into the lungs.  . metoprolol tartrate (LOPRESSOR) 25 MG tablet Take 12.5 mg by mouth 2 (two) times daily.  .Marland Kitchenvoriconazole (VFEND) 50 MG tablet Take 2 tablets (1055m with one 20023mablet to equal 300m3mice daily  . [DISCONTINUED] midodrine (PROAMATINE) 5 MG tablet Take 5 mg by mouth 3 (three) times daily with meals.  . [DISCONTINUED] ramipril (ALTACE) 5 MG capsule **HOLD** until follow up with physician. Previous instructions: take 1 capsule (5 mg total) by mouth daily.   No facility-administered encounter medications on file as of 02/03/2018.     ALLERGIES:  No Known Allergies   PHYSICAL EXAM:  ECOG Performance status: 1  Vitals:   02/03/18 1100  BP: (!) 148/82  Pulse: (!) 13  Resp: 20  Temp: 97.9 F (36.6 C)  SpO2: 99%   Filed Weights   02/03/18 1100  Weight: 211 lb 4 oz (95.8 kg)    Physical Exam Constitutional:      Appearance: Normal appearance. He is normal weight.  Musculoskeletal: Normal range of motion.  Skin:    General: Skin is warm and dry.  Neurological:     Mental Status: He is alert and oriented to person, place, and time. Mental status is at baseline.  Psychiatric:        Mood and Affect: Mood normal.        Behavior: Behavior normal.        Thought Content: Thought content normal.        Judgment: Judgment normal.      LABORATORY DATA:  I have reviewed the labs as listed.  CBC    Component Value Date/Time   WBC 5.4 01/27/2018 1309   RBC 2.89 (L) 01/27/2018 1309   HGB 10.7 (L) 01/27/2018 1309   HCT 31.1 (L) 01/27/2018 1309   PLT 159 01/27/2018 1309   MCV 107.6 (H) 01/27/2018 1309   MCH 37.0 (H) 01/27/2018 1309    MCHC 34.4 01/27/2018 1309   RDW 14.8 01/27/2018 1309   LYMPHSABS 0.9 01/27/2018 1309   MONOABS 0.4 01/27/2018 1309   EOSABS 0.2 01/27/2018 1309   BASOSABS 0.1 01/27/2018 1309   CMP Latest Ref Rng & Units 01/27/2018 08/16/2017 08/05/2017  Glucose 70 - 99 mg/dL 158(H) 129(H) 148(H)  BUN 6 - 20 mg/dL _0 Creatinine 0.61 - 1.24 mg/dL 0.89 0.74 0.73  Sodium  135 - 145 mmol/L 135 138 140  Potassium 3.5 - 5.1 mmol/L 4.0 4.1 3.7  Chloride 98 - 111 mmol/L 102 102 104  CO2 22 - 32 mmol/L _0 Calcium 8.9 - 10.3 mg/dL 9.0 9.0 8.6(L)  Total Protein 6.5 - 8.1 g/dL 6.7 6.8 6.1(L)  Total Bilirubin 0.3 - 1.2 mg/dL 0.5 1.0 1.4(H)  Alkaline Phos 38 - 126 U/L 87 327(H) 399(H)  AST 15 - 41 U/L 37 42(H) 36  ALT 0 - 44 U/L 42 43 50(H)       DIAGNOSTIC IMAGING:  I have independently reviewed the scans and discussed with the patient.   I have reviewed Francene Finders, NP's note and agree with the documentation.  I personally performed a face-to-face visit, made revisions and my assessment and plan is as follows.    ASSESSMENT & PLAN:   Multiple myeloma in remission ([place]) 1.  IgG kappa multiple myeloma, stage III: -VRD x4 cycles followed by auto HSCT on 08/07/2012. -he was on Revlimid maintenance 15 mg daily. -Unfortunately he developed B cell ALL.  2.  B-cell ALL: -FISH negative for t(9;22) but ALL fusion profile positive for BCR/ABL (P190) fusion. -Status post induction according to CALGB 12929 regimen with recovery marrow on 08/13/2017 consistent with remission. -Brief period of POMP maintenance therapy followed by Blincyto on 10/01/2017 plus Sprycel 20 mg (reduced dose secondary to concomitant Vfend and Blincyto) -Underwent bone marrow biopsy on 01/24/2018 - He is being worked up for allogenic transplant.  His youngest brother was apparently matched. -He follows up with Junius [facility] at [facility]. -We reviewed blood work from last week.  No gross abnormalities. -We will see him back  in 6 months for follow-up.         Orders placed this encounter:  Orders Placed This Encounter  Procedures  . Lactate dehydrogenase  . Protein electrophoresis, serum  . Kappa/lambda light chains  . CBC with Differential/Platelet  . Comprehensive metabolic panel      Derek Jack, MD [place] [phone]

## 2018-02-03 NOTE — Patient Instructions (Signed)
[facility] Discharge Instructions     Thank you for choosing [facility] at [facility] to provide your oncology and hematology care.  To afford each patient quality time with our provider, please arrive at least 15 minutes before your scheduled appointment time.   If you have a lab appointment with the Cancer Center please come in thru the  Main Entrance and check in at the main information desk  You need to re-schedule your appointment should you arrive 10 or more minutes late.  We strive to give you quality time with our providers, and arriving late affects you and other patients whose appointments are after yours.  Also, if you no show three or more times for appointments you may be dismissed from the clinic at the providers discretion.     Again, thank you for choosing [facility].  Our hope is that these requests will decrease the amount of time that you wait before being seen by our physicians.       _____________________________________________________________  Should you have questions after your visit to  Cancer Center, please contact our office at [phone] between the hours of 8:00 a.m. and 4:30 p.m.  Voicemails left after 4:00 p.m. will not be returned until the following business day.  For prescription refill requests, have your pharmacy contact our office and allow 72 hours.    Cancer Center Support Programs:   > Cancer Support Group  2nd Tuesday of the month 1pm-2pm, Journey Room    

## 2018-02-07 DIAGNOSIS — C9101 Acute lymphoblastic leukemia, in remission: Secondary | ICD-10-CM | POA: Diagnosis not present

## 2018-02-07 DIAGNOSIS — E781 Pure hyperglyceridemia: Secondary | ICD-10-CM | POA: Diagnosis not present

## 2018-02-07 DIAGNOSIS — E538 Deficiency of other specified B group vitamins: Secondary | ICD-10-CM | POA: Diagnosis not present

## 2018-02-07 DIAGNOSIS — C9001 Multiple myeloma in remission: Secondary | ICD-10-CM | POA: Diagnosis not present

## 2018-02-07 DIAGNOSIS — Z79899 Other long term (current) drug therapy: Secondary | ICD-10-CM | POA: Diagnosis not present

## 2018-02-07 DIAGNOSIS — T380X5A Adverse effect of glucocorticoids and synthetic analogues, initial encounter: Secondary | ICD-10-CM | POA: Diagnosis not present

## 2018-02-07 DIAGNOSIS — R739 Hyperglycemia, unspecified: Secondary | ICD-10-CM | POA: Diagnosis not present

## 2018-02-07 DIAGNOSIS — R000 Tachycardia, unspecified: Secondary | ICD-10-CM | POA: Diagnosis not present

## 2018-02-07 DIAGNOSIS — E785 Hyperlipidemia, unspecified: Secondary | ICD-10-CM | POA: Diagnosis not present

## 2018-02-07 DIAGNOSIS — Z9484 Stem cells transplant status: Secondary | ICD-10-CM | POA: Diagnosis not present

## 2018-02-07 DIAGNOSIS — I951 Orthostatic hypotension: Secondary | ICD-10-CM | POA: Diagnosis not present

## 2018-02-14 DIAGNOSIS — T380X5D Adverse effect of glucocorticoids and synthetic analogues, subsequent encounter: Secondary | ICD-10-CM | POA: Diagnosis not present

## 2018-02-14 DIAGNOSIS — R000 Tachycardia, unspecified: Secondary | ICD-10-CM | POA: Diagnosis not present

## 2018-02-14 DIAGNOSIS — E538 Deficiency of other specified B group vitamins: Secondary | ICD-10-CM | POA: Diagnosis not present

## 2018-02-14 DIAGNOSIS — C9101 Acute lymphoblastic leukemia, in remission: Secondary | ICD-10-CM | POA: Diagnosis not present

## 2018-02-14 DIAGNOSIS — E785 Hyperlipidemia, unspecified: Secondary | ICD-10-CM | POA: Diagnosis not present

## 2018-02-14 DIAGNOSIS — I951 Orthostatic hypotension: Secondary | ICD-10-CM | POA: Diagnosis not present

## 2018-02-14 DIAGNOSIS — R030 Elevated blood-pressure reading, without diagnosis of hypertension: Secondary | ICD-10-CM | POA: Diagnosis not present

## 2018-02-14 DIAGNOSIS — Z95828 Presence of other vascular implants and grafts: Secondary | ICD-10-CM | POA: Diagnosis not present

## 2018-02-14 DIAGNOSIS — Z79899 Other long term (current) drug therapy: Secondary | ICD-10-CM | POA: Diagnosis not present

## 2018-02-14 DIAGNOSIS — C9001 Multiple myeloma in remission: Secondary | ICD-10-CM | POA: Diagnosis not present

## 2018-02-14 DIAGNOSIS — E1165 Type 2 diabetes mellitus with hyperglycemia: Secondary | ICD-10-CM | POA: Diagnosis not present

## 2018-02-14 DIAGNOSIS — Z794 Long term (current) use of insulin: Secondary | ICD-10-CM | POA: Diagnosis not present

## 2018-02-14 DIAGNOSIS — Z9484 Stem cells transplant status: Secondary | ICD-10-CM | POA: Diagnosis not present

## 2018-02-19 DIAGNOSIS — R918 Other nonspecific abnormal finding of lung field: Secondary | ICD-10-CM | POA: Diagnosis not present

## 2018-02-19 DIAGNOSIS — R942 Abnormal results of pulmonary function studies: Secondary | ICD-10-CM | POA: Diagnosis not present

## 2018-02-19 DIAGNOSIS — Z01818 Encounter for other preprocedural examination: Secondary | ICD-10-CM | POA: Diagnosis not present

## 2018-02-21 DIAGNOSIS — C9101 Acute lymphoblastic leukemia, in remission: Secondary | ICD-10-CM | POA: Diagnosis not present

## 2018-02-21 DIAGNOSIS — I951 Orthostatic hypotension: Secondary | ICD-10-CM | POA: Diagnosis not present

## 2018-02-21 DIAGNOSIS — R000 Tachycardia, unspecified: Secondary | ICD-10-CM | POA: Diagnosis not present

## 2018-02-21 DIAGNOSIS — C9000 Multiple myeloma not having achieved remission: Secondary | ICD-10-CM | POA: Diagnosis not present

## 2018-02-21 DIAGNOSIS — E1165 Type 2 diabetes mellitus with hyperglycemia: Secondary | ICD-10-CM | POA: Diagnosis not present

## 2018-02-21 DIAGNOSIS — Z79899 Other long term (current) drug therapy: Secondary | ICD-10-CM | POA: Diagnosis not present

## 2018-02-21 DIAGNOSIS — E785 Hyperlipidemia, unspecified: Secondary | ICD-10-CM | POA: Diagnosis not present

## 2018-02-21 DIAGNOSIS — T380X5A Adverse effect of glucocorticoids and synthetic analogues, initial encounter: Secondary | ICD-10-CM | POA: Diagnosis not present

## 2018-02-21 DIAGNOSIS — E538 Deficiency of other specified B group vitamins: Secondary | ICD-10-CM | POA: Diagnosis not present

## 2018-02-21 DIAGNOSIS — Z9484 Stem cells transplant status: Secondary | ICD-10-CM | POA: Diagnosis not present

## 2018-02-21 DIAGNOSIS — R739 Hyperglycemia, unspecified: Secondary | ICD-10-CM | POA: Diagnosis not present

## 2018-02-21 DIAGNOSIS — R030 Elevated blood-pressure reading, without diagnosis of hypertension: Secondary | ICD-10-CM | POA: Diagnosis not present

## 2018-02-21 DIAGNOSIS — C9001 Multiple myeloma in remission: Secondary | ICD-10-CM | POA: Diagnosis not present

## 2018-02-28 DIAGNOSIS — E538 Deficiency of other specified B group vitamins: Secondary | ICD-10-CM | POA: Diagnosis not present

## 2018-02-28 DIAGNOSIS — Z7984 Long term (current) use of oral hypoglycemic drugs: Secondary | ICD-10-CM | POA: Diagnosis not present

## 2018-02-28 DIAGNOSIS — R030 Elevated blood-pressure reading, without diagnosis of hypertension: Secondary | ICD-10-CM | POA: Diagnosis not present

## 2018-02-28 DIAGNOSIS — C9001 Multiple myeloma in remission: Secondary | ICD-10-CM | POA: Diagnosis not present

## 2018-02-28 DIAGNOSIS — C9101 Acute lymphoblastic leukemia, in remission: Secondary | ICD-10-CM | POA: Diagnosis not present

## 2018-02-28 DIAGNOSIS — E785 Hyperlipidemia, unspecified: Secondary | ICD-10-CM | POA: Diagnosis not present

## 2018-02-28 DIAGNOSIS — Z9484 Stem cells transplant status: Secondary | ICD-10-CM | POA: Diagnosis not present

## 2018-02-28 DIAGNOSIS — R942 Abnormal results of pulmonary function studies: Secondary | ICD-10-CM | POA: Diagnosis not present

## 2018-02-28 DIAGNOSIS — C9100 Acute lymphoblastic leukemia not having achieved remission: Secondary | ICD-10-CM | POA: Diagnosis not present

## 2018-02-28 DIAGNOSIS — E1165 Type 2 diabetes mellitus with hyperglycemia: Secondary | ICD-10-CM | POA: Diagnosis not present

## 2018-02-28 DIAGNOSIS — C9000 Multiple myeloma not having achieved remission: Secondary | ICD-10-CM | POA: Diagnosis not present

## 2018-02-28 DIAGNOSIS — Z79899 Other long term (current) drug therapy: Secondary | ICD-10-CM | POA: Diagnosis not present

## 2018-02-28 DIAGNOSIS — R000 Tachycardia, unspecified: Secondary | ICD-10-CM | POA: Diagnosis not present

## 2018-03-03 DIAGNOSIS — R942 Abnormal results of pulmonary function studies: Secondary | ICD-10-CM | POA: Diagnosis not present

## 2018-03-03 DIAGNOSIS — C9100 Acute lymphoblastic leukemia not having achieved remission: Secondary | ICD-10-CM | POA: Diagnosis not present

## 2018-03-03 DIAGNOSIS — J984 Other disorders of lung: Secondary | ICD-10-CM | POA: Diagnosis not present

## 2018-03-07 DIAGNOSIS — Z9484 Stem cells transplant status: Secondary | ICD-10-CM | POA: Diagnosis not present

## 2018-03-07 DIAGNOSIS — Z7984 Long term (current) use of oral hypoglycemic drugs: Secondary | ICD-10-CM | POA: Diagnosis not present

## 2018-03-07 DIAGNOSIS — Z79899 Other long term (current) drug therapy: Secondary | ICD-10-CM | POA: Diagnosis not present

## 2018-03-07 DIAGNOSIS — E785 Hyperlipidemia, unspecified: Secondary | ICD-10-CM | POA: Diagnosis not present

## 2018-03-07 DIAGNOSIS — R030 Elevated blood-pressure reading, without diagnosis of hypertension: Secondary | ICD-10-CM | POA: Diagnosis not present

## 2018-03-07 DIAGNOSIS — R000 Tachycardia, unspecified: Secondary | ICD-10-CM | POA: Diagnosis not present

## 2018-03-07 DIAGNOSIS — E538 Deficiency of other specified B group vitamins: Secondary | ICD-10-CM | POA: Diagnosis not present

## 2018-03-07 DIAGNOSIS — E1165 Type 2 diabetes mellitus with hyperglycemia: Secondary | ICD-10-CM | POA: Diagnosis not present

## 2018-03-07 DIAGNOSIS — C9101 Acute lymphoblastic leukemia, in remission: Secondary | ICD-10-CM | POA: Diagnosis not present

## 2018-03-07 DIAGNOSIS — R739 Hyperglycemia, unspecified: Secondary | ICD-10-CM | POA: Diagnosis not present

## 2018-03-07 DIAGNOSIS — T380X5A Adverse effect of glucocorticoids and synthetic analogues, initial encounter: Secondary | ICD-10-CM | POA: Diagnosis not present

## 2018-03-07 DIAGNOSIS — C9100 Acute lymphoblastic leukemia not having achieved remission: Secondary | ICD-10-CM | POA: Diagnosis not present

## 2018-03-07 DIAGNOSIS — C9001 Multiple myeloma in remission: Secondary | ICD-10-CM | POA: Diagnosis not present

## 2018-03-07 DIAGNOSIS — Z451 Encounter for adjustment and management of infusion pump: Secondary | ICD-10-CM | POA: Diagnosis not present

## 2018-03-07 DIAGNOSIS — Z7901 Long term (current) use of anticoagulants: Secondary | ICD-10-CM | POA: Diagnosis not present

## 2018-03-07 DIAGNOSIS — C9000 Multiple myeloma not having achieved remission: Secondary | ICD-10-CM | POA: Diagnosis not present

## 2018-03-07 DIAGNOSIS — R942 Abnormal results of pulmonary function studies: Secondary | ICD-10-CM | POA: Diagnosis not present

## 2018-03-17 DIAGNOSIS — Z9484 Stem cells transplant status: Secondary | ICD-10-CM | POA: Diagnosis not present

## 2018-03-17 DIAGNOSIS — C9001 Multiple myeloma in remission: Secondary | ICD-10-CM | POA: Diagnosis not present

## 2018-03-17 DIAGNOSIS — Z01818 Encounter for other preprocedural examination: Secondary | ICD-10-CM | POA: Diagnosis not present

## 2018-03-17 DIAGNOSIS — B49 Unspecified mycosis: Secondary | ICD-10-CM | POA: Diagnosis not present

## 2018-03-17 DIAGNOSIS — C9101 Acute lymphoblastic leukemia, in remission: Secondary | ICD-10-CM | POA: Diagnosis not present

## 2018-03-17 DIAGNOSIS — C9100 Acute lymphoblastic leukemia not having achieved remission: Secondary | ICD-10-CM | POA: Diagnosis not present

## 2018-03-17 DIAGNOSIS — R942 Abnormal results of pulmonary function studies: Secondary | ICD-10-CM | POA: Diagnosis not present

## 2018-03-21 DIAGNOSIS — Z5111 Encounter for antineoplastic chemotherapy: Secondary | ICD-10-CM | POA: Diagnosis not present

## 2018-03-21 DIAGNOSIS — I951 Orthostatic hypotension: Secondary | ICD-10-CM | POA: Diagnosis not present

## 2018-03-21 DIAGNOSIS — C9000 Multiple myeloma not having achieved remission: Secondary | ICD-10-CM | POA: Diagnosis not present

## 2018-03-21 DIAGNOSIS — T380X5A Adverse effect of glucocorticoids and synthetic analogues, initial encounter: Secondary | ICD-10-CM | POA: Diagnosis not present

## 2018-03-21 DIAGNOSIS — C9101 Acute lymphoblastic leukemia, in remission: Secondary | ICD-10-CM | POA: Diagnosis not present

## 2018-03-21 DIAGNOSIS — Z794 Long term (current) use of insulin: Secondary | ICD-10-CM | POA: Diagnosis not present

## 2018-03-21 DIAGNOSIS — R739 Hyperglycemia, unspecified: Secondary | ICD-10-CM | POA: Diagnosis not present

## 2018-03-21 DIAGNOSIS — Z959 Presence of cardiac and vascular implant and graft, unspecified: Secondary | ICD-10-CM | POA: Diagnosis not present

## 2018-03-21 DIAGNOSIS — E785 Hyperlipidemia, unspecified: Secondary | ICD-10-CM | POA: Diagnosis not present

## 2018-03-21 DIAGNOSIS — Z7984 Long term (current) use of oral hypoglycemic drugs: Secondary | ICD-10-CM | POA: Diagnosis not present

## 2018-03-21 DIAGNOSIS — Z95828 Presence of other vascular implants and grafts: Secondary | ICD-10-CM | POA: Diagnosis not present

## 2018-03-21 DIAGNOSIS — Z9484 Stem cells transplant status: Secondary | ICD-10-CM | POA: Diagnosis not present

## 2018-03-21 DIAGNOSIS — I10 Essential (primary) hypertension: Secondary | ICD-10-CM | POA: Diagnosis not present

## 2018-03-28 DIAGNOSIS — T380X5A Adverse effect of glucocorticoids and synthetic analogues, initial encounter: Secondary | ICD-10-CM | POA: Diagnosis not present

## 2018-03-28 DIAGNOSIS — R739 Hyperglycemia, unspecified: Secondary | ICD-10-CM | POA: Diagnosis not present

## 2018-03-28 DIAGNOSIS — E785 Hyperlipidemia, unspecified: Secondary | ICD-10-CM | POA: Diagnosis not present

## 2018-03-28 DIAGNOSIS — E538 Deficiency of other specified B group vitamins: Secondary | ICD-10-CM | POA: Diagnosis not present

## 2018-03-28 DIAGNOSIS — Z959 Presence of cardiac and vascular implant and graft, unspecified: Secondary | ICD-10-CM | POA: Diagnosis not present

## 2018-03-28 DIAGNOSIS — Z5111 Encounter for antineoplastic chemotherapy: Secondary | ICD-10-CM | POA: Diagnosis not present

## 2018-03-28 DIAGNOSIS — Z794 Long term (current) use of insulin: Secondary | ICD-10-CM | POA: Diagnosis not present

## 2018-03-28 DIAGNOSIS — Z79899 Other long term (current) drug therapy: Secondary | ICD-10-CM | POA: Diagnosis not present

## 2018-03-28 DIAGNOSIS — R000 Tachycardia, unspecified: Secondary | ICD-10-CM | POA: Diagnosis not present

## 2018-03-28 DIAGNOSIS — I10 Essential (primary) hypertension: Secondary | ICD-10-CM | POA: Diagnosis not present

## 2018-03-28 DIAGNOSIS — C9100 Acute lymphoblastic leukemia not having achieved remission: Secondary | ICD-10-CM | POA: Diagnosis not present

## 2018-03-28 DIAGNOSIS — Z9484 Stem cells transplant status: Secondary | ICD-10-CM | POA: Diagnosis not present

## 2018-03-28 DIAGNOSIS — E1165 Type 2 diabetes mellitus with hyperglycemia: Secondary | ICD-10-CM | POA: Diagnosis not present

## 2018-04-02 DIAGNOSIS — C9001 Multiple myeloma in remission: Secondary | ICD-10-CM | POA: Diagnosis not present

## 2018-04-02 DIAGNOSIS — E119 Type 2 diabetes mellitus without complications: Secondary | ICD-10-CM | POA: Diagnosis not present

## 2018-04-02 DIAGNOSIS — E1010 Type 1 diabetes mellitus with ketoacidosis without coma: Secondary | ICD-10-CM | POA: Diagnosis not present

## 2018-04-02 DIAGNOSIS — C9100 Acute lymphoblastic leukemia not having achieved remission: Secondary | ICD-10-CM | POA: Diagnosis not present

## 2018-04-02 DIAGNOSIS — I10 Essential (primary) hypertension: Secondary | ICD-10-CM | POA: Diagnosis not present

## 2018-04-04 DIAGNOSIS — R740 Nonspecific elevation of levels of transaminase and lactic acid dehydrogenase [LDH]: Secondary | ICD-10-CM | POA: Diagnosis not present

## 2018-04-04 DIAGNOSIS — R000 Tachycardia, unspecified: Secondary | ICD-10-CM | POA: Diagnosis not present

## 2018-04-04 DIAGNOSIS — R911 Solitary pulmonary nodule: Secondary | ICD-10-CM | POA: Diagnosis not present

## 2018-04-04 DIAGNOSIS — E785 Hyperlipidemia, unspecified: Secondary | ICD-10-CM | POA: Diagnosis not present

## 2018-04-04 DIAGNOSIS — D696 Thrombocytopenia, unspecified: Secondary | ICD-10-CM | POA: Diagnosis not present

## 2018-04-04 DIAGNOSIS — C9100 Acute lymphoblastic leukemia not having achieved remission: Secondary | ICD-10-CM | POA: Diagnosis not present

## 2018-04-04 DIAGNOSIS — Z95828 Presence of other vascular implants and grafts: Secondary | ICD-10-CM | POA: Diagnosis not present

## 2018-04-04 DIAGNOSIS — C9101 Acute lymphoblastic leukemia, in remission: Secondary | ICD-10-CM | POA: Diagnosis not present

## 2018-04-04 DIAGNOSIS — T380X5D Adverse effect of glucocorticoids and synthetic analogues, subsequent encounter: Secondary | ICD-10-CM | POA: Diagnosis not present

## 2018-04-04 DIAGNOSIS — R601 Generalized edema: Secondary | ICD-10-CM | POA: Diagnosis not present

## 2018-04-04 DIAGNOSIS — E1165 Type 2 diabetes mellitus with hyperglycemia: Secondary | ICD-10-CM | POA: Diagnosis not present

## 2018-04-04 DIAGNOSIS — E538 Deficiency of other specified B group vitamins: Secondary | ICD-10-CM | POA: Diagnosis not present

## 2018-04-11 DIAGNOSIS — Z9889 Other specified postprocedural states: Secondary | ICD-10-CM | POA: Diagnosis not present

## 2018-04-11 DIAGNOSIS — Z95828 Presence of other vascular implants and grafts: Secondary | ICD-10-CM | POA: Diagnosis not present

## 2018-04-11 DIAGNOSIS — R000 Tachycardia, unspecified: Secondary | ICD-10-CM | POA: Diagnosis not present

## 2018-04-11 DIAGNOSIS — C9100 Acute lymphoblastic leukemia not having achieved remission: Secondary | ICD-10-CM | POA: Diagnosis not present

## 2018-04-11 DIAGNOSIS — Z87898 Personal history of other specified conditions: Secondary | ICD-10-CM | POA: Diagnosis not present

## 2018-04-11 DIAGNOSIS — R739 Hyperglycemia, unspecified: Secondary | ICD-10-CM | POA: Diagnosis not present

## 2018-04-11 DIAGNOSIS — T380X5A Adverse effect of glucocorticoids and synthetic analogues, initial encounter: Secondary | ICD-10-CM | POA: Diagnosis not present

## 2018-04-11 DIAGNOSIS — Z723 Lack of physical exercise: Secondary | ICD-10-CM | POA: Diagnosis not present

## 2018-04-11 DIAGNOSIS — I10 Essential (primary) hypertension: Secondary | ICD-10-CM | POA: Diagnosis not present

## 2018-04-11 DIAGNOSIS — E785 Hyperlipidemia, unspecified: Secondary | ICD-10-CM | POA: Diagnosis not present

## 2018-04-11 DIAGNOSIS — R601 Generalized edema: Secondary | ICD-10-CM | POA: Diagnosis not present

## 2018-04-11 DIAGNOSIS — C9101 Acute lymphoblastic leukemia, in remission: Secondary | ICD-10-CM | POA: Diagnosis not present

## 2018-04-11 DIAGNOSIS — Z8579 Personal history of other malignant neoplasms of lymphoid, hematopoietic and related tissues: Secondary | ICD-10-CM | POA: Diagnosis not present

## 2018-04-11 DIAGNOSIS — Z5111 Encounter for antineoplastic chemotherapy: Secondary | ICD-10-CM | POA: Diagnosis not present

## 2018-04-11 DIAGNOSIS — Z959 Presence of cardiac and vascular implant and graft, unspecified: Secondary | ICD-10-CM | POA: Diagnosis not present

## 2018-04-11 DIAGNOSIS — Z794 Long term (current) use of insulin: Secondary | ICD-10-CM | POA: Diagnosis not present

## 2018-04-11 DIAGNOSIS — E538 Deficiency of other specified B group vitamins: Secondary | ICD-10-CM | POA: Diagnosis not present

## 2018-04-11 DIAGNOSIS — E1165 Type 2 diabetes mellitus with hyperglycemia: Secondary | ICD-10-CM | POA: Diagnosis not present

## 2018-04-11 DIAGNOSIS — Z79899 Other long term (current) drug therapy: Secondary | ICD-10-CM | POA: Diagnosis not present

## 2018-04-18 DIAGNOSIS — R739 Hyperglycemia, unspecified: Secondary | ICD-10-CM | POA: Diagnosis not present

## 2018-04-18 DIAGNOSIS — E1165 Type 2 diabetes mellitus with hyperglycemia: Secondary | ICD-10-CM | POA: Diagnosis not present

## 2018-04-18 DIAGNOSIS — Z95828 Presence of other vascular implants and grafts: Secondary | ICD-10-CM | POA: Diagnosis not present

## 2018-04-18 DIAGNOSIS — I951 Orthostatic hypotension: Secondary | ICD-10-CM | POA: Diagnosis not present

## 2018-04-18 DIAGNOSIS — C9100 Acute lymphoblastic leukemia not having achieved remission: Secondary | ICD-10-CM | POA: Diagnosis not present

## 2018-04-18 DIAGNOSIS — C9001 Multiple myeloma in remission: Secondary | ICD-10-CM | POA: Diagnosis not present

## 2018-04-18 DIAGNOSIS — Z959 Presence of cardiac and vascular implant and graft, unspecified: Secondary | ICD-10-CM | POA: Diagnosis not present

## 2018-04-18 DIAGNOSIS — I10 Essential (primary) hypertension: Secondary | ICD-10-CM | POA: Diagnosis not present

## 2018-04-18 DIAGNOSIS — E785 Hyperlipidemia, unspecified: Secondary | ICD-10-CM | POA: Diagnosis not present

## 2018-04-18 DIAGNOSIS — Z87898 Personal history of other specified conditions: Secondary | ICD-10-CM | POA: Diagnosis not present

## 2018-04-18 DIAGNOSIS — C9102 Acute lymphoblastic leukemia, in relapse: Secondary | ICD-10-CM | POA: Diagnosis not present

## 2018-04-18 DIAGNOSIS — T451X5S Adverse effect of antineoplastic and immunosuppressive drugs, sequela: Secondary | ICD-10-CM | POA: Diagnosis not present

## 2018-04-18 DIAGNOSIS — Z79899 Other long term (current) drug therapy: Secondary | ICD-10-CM | POA: Diagnosis not present

## 2018-04-18 DIAGNOSIS — R918 Other nonspecific abnormal finding of lung field: Secondary | ICD-10-CM | POA: Diagnosis not present

## 2018-04-18 DIAGNOSIS — T380X5A Adverse effect of glucocorticoids and synthetic analogues, initial encounter: Secondary | ICD-10-CM | POA: Diagnosis not present

## 2018-04-18 DIAGNOSIS — T380X5D Adverse effect of glucocorticoids and synthetic analogues, subsequent encounter: Secondary | ICD-10-CM | POA: Diagnosis not present

## 2018-04-18 DIAGNOSIS — E538 Deficiency of other specified B group vitamins: Secondary | ICD-10-CM | POA: Diagnosis not present

## 2018-04-18 DIAGNOSIS — Z723 Lack of physical exercise: Secondary | ICD-10-CM | POA: Diagnosis not present

## 2018-04-18 DIAGNOSIS — R000 Tachycardia, unspecified: Secondary | ICD-10-CM | POA: Diagnosis not present

## 2018-04-18 DIAGNOSIS — Z794 Long term (current) use of insulin: Secondary | ICD-10-CM | POA: Diagnosis not present

## 2018-04-23 DIAGNOSIS — C9100 Acute lymphoblastic leukemia not having achieved remission: Secondary | ICD-10-CM | POA: Diagnosis not present

## 2018-04-24 DIAGNOSIS — C9102 Acute lymphoblastic leukemia, in relapse: Secondary | ICD-10-CM | POA: Diagnosis not present

## 2018-04-24 DIAGNOSIS — C9100 Acute lymphoblastic leukemia not having achieved remission: Secondary | ICD-10-CM | POA: Diagnosis not present

## 2018-04-25 DIAGNOSIS — Z959 Presence of cardiac and vascular implant and graft, unspecified: Secondary | ICD-10-CM | POA: Diagnosis not present

## 2018-04-25 DIAGNOSIS — R918 Other nonspecific abnormal finding of lung field: Secondary | ICD-10-CM | POA: Diagnosis not present

## 2018-04-25 DIAGNOSIS — Z5111 Encounter for antineoplastic chemotherapy: Secondary | ICD-10-CM | POA: Diagnosis not present

## 2018-04-25 DIAGNOSIS — I517 Cardiomegaly: Secondary | ICD-10-CM | POA: Diagnosis not present

## 2018-04-25 DIAGNOSIS — T380X5A Adverse effect of glucocorticoids and synthetic analogues, initial encounter: Secondary | ICD-10-CM | POA: Diagnosis not present

## 2018-04-25 DIAGNOSIS — E538 Deficiency of other specified B group vitamins: Secondary | ICD-10-CM | POA: Diagnosis not present

## 2018-04-25 DIAGNOSIS — C9000 Multiple myeloma not having achieved remission: Secondary | ICD-10-CM | POA: Diagnosis not present

## 2018-04-25 DIAGNOSIS — R000 Tachycardia, unspecified: Secondary | ICD-10-CM | POA: Diagnosis not present

## 2018-04-25 DIAGNOSIS — E1165 Type 2 diabetes mellitus with hyperglycemia: Secondary | ICD-10-CM | POA: Diagnosis not present

## 2018-04-25 DIAGNOSIS — R739 Hyperglycemia, unspecified: Secondary | ICD-10-CM | POA: Diagnosis not present

## 2018-04-25 DIAGNOSIS — R601 Generalized edema: Secondary | ICD-10-CM | POA: Diagnosis not present

## 2018-04-25 DIAGNOSIS — C9102 Acute lymphoblastic leukemia, in relapse: Secondary | ICD-10-CM | POA: Diagnosis not present

## 2018-04-25 DIAGNOSIS — I10 Essential (primary) hypertension: Secondary | ICD-10-CM | POA: Diagnosis not present

## 2018-04-25 DIAGNOSIS — E785 Hyperlipidemia, unspecified: Secondary | ICD-10-CM | POA: Diagnosis not present

## 2018-04-25 DIAGNOSIS — I5020 Unspecified systolic (congestive) heart failure: Secondary | ICD-10-CM | POA: Diagnosis not present

## 2018-04-29 DIAGNOSIS — R918 Other nonspecific abnormal finding of lung field: Secondary | ICD-10-CM | POA: Diagnosis not present

## 2018-04-29 DIAGNOSIS — E785 Hyperlipidemia, unspecified: Secondary | ICD-10-CM | POA: Diagnosis not present

## 2018-04-29 DIAGNOSIS — C9102 Acute lymphoblastic leukemia, in relapse: Secondary | ICD-10-CM | POA: Diagnosis not present

## 2018-04-29 DIAGNOSIS — Z95828 Presence of other vascular implants and grafts: Secondary | ICD-10-CM | POA: Diagnosis not present

## 2018-04-29 DIAGNOSIS — Z87898 Personal history of other specified conditions: Secondary | ICD-10-CM | POA: Diagnosis not present

## 2018-04-29 DIAGNOSIS — I10 Essential (primary) hypertension: Secondary | ICD-10-CM | POA: Diagnosis not present

## 2018-04-29 DIAGNOSIS — Z79899 Other long term (current) drug therapy: Secondary | ICD-10-CM | POA: Diagnosis not present

## 2018-04-29 DIAGNOSIS — R000 Tachycardia, unspecified: Secondary | ICD-10-CM | POA: Diagnosis not present

## 2018-04-29 DIAGNOSIS — I517 Cardiomegaly: Secondary | ICD-10-CM | POA: Diagnosis not present

## 2018-04-29 DIAGNOSIS — Z794 Long term (current) use of insulin: Secondary | ICD-10-CM | POA: Diagnosis not present

## 2018-04-29 DIAGNOSIS — Z959 Presence of cardiac and vascular implant and graft, unspecified: Secondary | ICD-10-CM | POA: Diagnosis not present

## 2018-04-29 DIAGNOSIS — E611 Iron deficiency: Secondary | ICD-10-CM | POA: Diagnosis not present

## 2018-04-29 DIAGNOSIS — E538 Deficiency of other specified B group vitamins: Secondary | ICD-10-CM | POA: Diagnosis not present

## 2018-04-29 DIAGNOSIS — R739 Hyperglycemia, unspecified: Secondary | ICD-10-CM | POA: Diagnosis not present

## 2018-04-29 DIAGNOSIS — T380X5A Adverse effect of glucocorticoids and synthetic analogues, initial encounter: Secondary | ICD-10-CM | POA: Diagnosis not present

## 2018-04-29 DIAGNOSIS — I951 Orthostatic hypotension: Secondary | ICD-10-CM | POA: Diagnosis not present

## 2018-04-29 DIAGNOSIS — Z723 Lack of physical exercise: Secondary | ICD-10-CM | POA: Diagnosis not present

## 2018-05-02 DIAGNOSIS — E785 Hyperlipidemia, unspecified: Secondary | ICD-10-CM | POA: Diagnosis not present

## 2018-05-02 DIAGNOSIS — C9000 Multiple myeloma not having achieved remission: Secondary | ICD-10-CM | POA: Diagnosis not present

## 2018-05-02 DIAGNOSIS — Z79899 Other long term (current) drug therapy: Secondary | ICD-10-CM | POA: Diagnosis not present

## 2018-05-02 DIAGNOSIS — Z9484 Stem cells transplant status: Secondary | ICD-10-CM | POA: Diagnosis not present

## 2018-05-02 DIAGNOSIS — C9100 Acute lymphoblastic leukemia not having achieved remission: Secondary | ICD-10-CM | POA: Diagnosis not present

## 2018-05-02 DIAGNOSIS — I951 Orthostatic hypotension: Secondary | ICD-10-CM | POA: Diagnosis not present

## 2018-05-02 DIAGNOSIS — C9102 Acute lymphoblastic leukemia, in relapse: Secondary | ICD-10-CM | POA: Diagnosis not present

## 2018-05-02 DIAGNOSIS — Z7952 Long term (current) use of systemic steroids: Secondary | ICD-10-CM | POA: Diagnosis not present

## 2018-05-02 DIAGNOSIS — E538 Deficiency of other specified B group vitamins: Secondary | ICD-10-CM | POA: Diagnosis not present

## 2018-05-02 DIAGNOSIS — R739 Hyperglycemia, unspecified: Secondary | ICD-10-CM | POA: Diagnosis not present

## 2018-05-02 DIAGNOSIS — Z95828 Presence of other vascular implants and grafts: Secondary | ICD-10-CM | POA: Diagnosis not present

## 2018-05-02 DIAGNOSIS — T380X5A Adverse effect of glucocorticoids and synthetic analogues, initial encounter: Secondary | ICD-10-CM | POA: Diagnosis not present

## 2018-05-02 DIAGNOSIS — R000 Tachycardia, unspecified: Secondary | ICD-10-CM | POA: Diagnosis not present

## 2018-05-09 DIAGNOSIS — I4581 Long QT syndrome: Secondary | ICD-10-CM | POA: Diagnosis not present

## 2018-05-09 DIAGNOSIS — R739 Hyperglycemia, unspecified: Secondary | ICD-10-CM | POA: Diagnosis not present

## 2018-05-09 DIAGNOSIS — Z959 Presence of cardiac and vascular implant and graft, unspecified: Secondary | ICD-10-CM | POA: Diagnosis not present

## 2018-05-09 DIAGNOSIS — E785 Hyperlipidemia, unspecified: Secondary | ICD-10-CM | POA: Diagnosis not present

## 2018-05-09 DIAGNOSIS — C9102 Acute lymphoblastic leukemia, in relapse: Secondary | ICD-10-CM | POA: Diagnosis not present

## 2018-05-09 DIAGNOSIS — Z723 Lack of physical exercise: Secondary | ICD-10-CM | POA: Diagnosis not present

## 2018-05-09 DIAGNOSIS — I517 Cardiomegaly: Secondary | ICD-10-CM | POA: Diagnosis not present

## 2018-05-09 DIAGNOSIS — I951 Orthostatic hypotension: Secondary | ICD-10-CM | POA: Diagnosis not present

## 2018-05-09 DIAGNOSIS — E538 Deficiency of other specified B group vitamins: Secondary | ICD-10-CM | POA: Diagnosis not present

## 2018-05-09 DIAGNOSIS — I10 Essential (primary) hypertension: Secondary | ICD-10-CM | POA: Diagnosis not present

## 2018-05-09 DIAGNOSIS — Z79899 Other long term (current) drug therapy: Secondary | ICD-10-CM | POA: Diagnosis not present

## 2018-05-09 DIAGNOSIS — Z5111 Encounter for antineoplastic chemotherapy: Secondary | ICD-10-CM | POA: Diagnosis not present

## 2018-05-09 DIAGNOSIS — R000 Tachycardia, unspecified: Secondary | ICD-10-CM | POA: Diagnosis not present

## 2018-05-09 DIAGNOSIS — D709 Neutropenia, unspecified: Secondary | ICD-10-CM | POA: Diagnosis not present

## 2018-05-09 DIAGNOSIS — R911 Solitary pulmonary nodule: Secondary | ICD-10-CM | POA: Diagnosis not present

## 2018-05-09 DIAGNOSIS — Z794 Long term (current) use of insulin: Secondary | ICD-10-CM | POA: Diagnosis not present

## 2018-05-09 DIAGNOSIS — Z9484 Stem cells transplant status: Secondary | ICD-10-CM | POA: Diagnosis not present

## 2018-05-09 DIAGNOSIS — Z792 Long term (current) use of antibiotics: Secondary | ICD-10-CM | POA: Diagnosis not present

## 2018-05-09 DIAGNOSIS — Z87898 Personal history of other specified conditions: Secondary | ICD-10-CM | POA: Diagnosis not present

## 2018-05-09 DIAGNOSIS — Z85828 Personal history of other malignant neoplasm of skin: Secondary | ICD-10-CM | POA: Diagnosis not present

## 2018-05-09 DIAGNOSIS — T380X5A Adverse effect of glucocorticoids and synthetic analogues, initial encounter: Secondary | ICD-10-CM | POA: Diagnosis not present

## 2018-05-09 DIAGNOSIS — R601 Generalized edema: Secondary | ICD-10-CM | POA: Diagnosis not present

## 2018-05-12 ENCOUNTER — Encounter: Payer: Self-pay | Admitting: *Deleted

## 2018-05-12 ENCOUNTER — Other Ambulatory Visit: Payer: Self-pay | Admitting: *Deleted

## 2018-05-12 NOTE — Patient Outreach (Signed)
[facility]) Care Management [place] Telephone Outreach- insurance screening referral 05/12/2018  John Singleton [birth year] [phone]  Successful telephone outreach to [facility], 58 y/o male referred to [facility] by insurance provider for high risk screening assessment.  Patient has history including, but not limited to, multiple myeloma/ leukemia; HTN; and DM.  HIPAA/ identity verified with patient during phone call today.  [facility] Singleton and purpose of call discussed with patient today.  Patient states that he is doing "really good," and he denies unmanageable pain, new recent falls and reports that he is independent in ambulation and ADL/ IADL's.  Patient further reports: -- PCP: Dr. [name]; reports last office visit "early March 2020."   -- sees oncology provider "twice a week" at [facility], where he is currently receiving chemotherapy twice weekly- endorses adherence to attending all scheduled chemotherapy appointments; states that at his cancer treatment center there are nurse case managers and CSW's that can assist him if needed- although he currently denies needing those Singleton -- lives with brother, who is currently providing transportation to chemotherapy sessions; states that he normally uses transportation resources through [facility] for transportation, however, these Singleton have been suspended for now due to the COVID-19 precautions; states that he is obtaining gas vouchers from [facility] instead; denies need for additional transportation [facility] at this time -- confirms that he is following recommended community precautions around COVID-19 for staying at home and social distancing; patient is able to verbalize accurate signs/ symptoms of possible COVID-19 exposure and appropriate action plan to contact his PCP should he develop signs/ symptoms.  Today, patient denies signs/ symptoms concerning for COVID-19.   -- reports DM "under control," and  denies ongoing issues around management of multiple myleoma -- denies medication concerns/ issues/ questions; states his medications are easy for him to self- manage, as he "does not have to take many."  Denies trouble affording his medications  Patient denies further issues, concerns, or problems today.  Offered to have [facility] contact him to further assess his care needs, and patient politely but firmly declined offer, stating that he "has all these Singleton available through the cancer center" at [facility] should he need assistance in the future; explained that [facility] are free of charge to him, and although he continues to decline [facility] at this time, he is agreeable to my placing a letter in the mail to him should he reconsider in the future.  I encouraged him to review letter once he receives it and to contact us if he reconsidered and would like for [place] to assist in his care needs; patient verbalizes agreement.  Plan:  Will send patient [facility] successful outreach patient letter for his review  Oneta Rack, RN, BSN, [place] Coordinator [facility] Care Management  [phone]

## 2018-05-13 DIAGNOSIS — C9000 Multiple myeloma not having achieved remission: Secondary | ICD-10-CM | POA: Diagnosis not present

## 2018-05-13 DIAGNOSIS — C9102 Acute lymphoblastic leukemia, in relapse: Secondary | ICD-10-CM | POA: Diagnosis not present

## 2018-05-13 DIAGNOSIS — Z95828 Presence of other vascular implants and grafts: Secondary | ICD-10-CM | POA: Diagnosis not present

## 2018-05-13 DIAGNOSIS — E785 Hyperlipidemia, unspecified: Secondary | ICD-10-CM | POA: Diagnosis not present

## 2018-05-13 DIAGNOSIS — Z79899 Other long term (current) drug therapy: Secondary | ICD-10-CM | POA: Diagnosis not present

## 2018-05-13 DIAGNOSIS — T380X5A Adverse effect of glucocorticoids and synthetic analogues, initial encounter: Secondary | ICD-10-CM | POA: Diagnosis not present

## 2018-05-13 DIAGNOSIS — C9100 Acute lymphoblastic leukemia not having achieved remission: Secondary | ICD-10-CM | POA: Diagnosis not present

## 2018-05-13 DIAGNOSIS — R739 Hyperglycemia, unspecified: Secondary | ICD-10-CM | POA: Diagnosis not present

## 2018-05-13 DIAGNOSIS — I951 Orthostatic hypotension: Secondary | ICD-10-CM | POA: Diagnosis not present

## 2018-05-13 DIAGNOSIS — Z959 Presence of cardiac and vascular implant and graft, unspecified: Secondary | ICD-10-CM | POA: Diagnosis not present

## 2018-05-13 DIAGNOSIS — R000 Tachycardia, unspecified: Secondary | ICD-10-CM | POA: Diagnosis not present

## 2018-05-13 DIAGNOSIS — I10 Essential (primary) hypertension: Secondary | ICD-10-CM | POA: Diagnosis not present

## 2018-05-13 DIAGNOSIS — E538 Deficiency of other specified B group vitamins: Secondary | ICD-10-CM | POA: Diagnosis not present

## 2018-05-16 DIAGNOSIS — C9102 Acute lymphoblastic leukemia, in relapse: Secondary | ICD-10-CM | POA: Diagnosis not present

## 2018-05-16 DIAGNOSIS — M8580 Other specified disorders of bone density and structure, unspecified site: Secondary | ICD-10-CM | POA: Diagnosis not present

## 2018-05-16 DIAGNOSIS — I951 Orthostatic hypotension: Secondary | ICD-10-CM | POA: Diagnosis not present

## 2018-05-16 DIAGNOSIS — R000 Tachycardia, unspecified: Secondary | ICD-10-CM | POA: Diagnosis not present

## 2018-05-16 DIAGNOSIS — E785 Hyperlipidemia, unspecified: Secondary | ICD-10-CM | POA: Diagnosis not present

## 2018-05-16 DIAGNOSIS — Z794 Long term (current) use of insulin: Secondary | ICD-10-CM | POA: Diagnosis not present

## 2018-05-16 DIAGNOSIS — Z5111 Encounter for antineoplastic chemotherapy: Secondary | ICD-10-CM | POA: Diagnosis not present

## 2018-05-16 DIAGNOSIS — Z79899 Other long term (current) drug therapy: Secondary | ICD-10-CM | POA: Diagnosis not present

## 2018-05-16 DIAGNOSIS — Z959 Presence of cardiac and vascular implant and graft, unspecified: Secondary | ICD-10-CM | POA: Diagnosis not present

## 2018-05-16 DIAGNOSIS — D709 Neutropenia, unspecified: Secondary | ICD-10-CM | POA: Diagnosis not present

## 2018-05-16 DIAGNOSIS — I10 Essential (primary) hypertension: Secondary | ICD-10-CM | POA: Diagnosis not present

## 2018-05-16 DIAGNOSIS — E538 Deficiency of other specified B group vitamins: Secondary | ICD-10-CM | POA: Diagnosis not present

## 2018-05-16 DIAGNOSIS — I517 Cardiomegaly: Secondary | ICD-10-CM | POA: Diagnosis not present

## 2018-05-16 DIAGNOSIS — Z87898 Personal history of other specified conditions: Secondary | ICD-10-CM | POA: Diagnosis not present

## 2018-05-16 DIAGNOSIS — I4581 Long QT syndrome: Secondary | ICD-10-CM | POA: Diagnosis not present

## 2018-05-16 DIAGNOSIS — R739 Hyperglycemia, unspecified: Secondary | ICD-10-CM | POA: Diagnosis not present

## 2018-05-16 DIAGNOSIS — T380X5A Adverse effect of glucocorticoids and synthetic analogues, initial encounter: Secondary | ICD-10-CM | POA: Diagnosis not present

## 2018-05-16 DIAGNOSIS — Z8619 Personal history of other infectious and parasitic diseases: Secondary | ICD-10-CM | POA: Diagnosis not present

## 2018-05-16 DIAGNOSIS — R911 Solitary pulmonary nodule: Secondary | ICD-10-CM | POA: Diagnosis not present

## 2018-05-16 DIAGNOSIS — Z9484 Stem cells transplant status: Secondary | ICD-10-CM | POA: Diagnosis not present

## 2018-05-16 DIAGNOSIS — Z95828 Presence of other vascular implants and grafts: Secondary | ICD-10-CM | POA: Diagnosis not present

## 2018-05-16 DIAGNOSIS — Z792 Long term (current) use of antibiotics: Secondary | ICD-10-CM | POA: Diagnosis not present

## 2018-05-23 DIAGNOSIS — Z87898 Personal history of other specified conditions: Secondary | ICD-10-CM | POA: Diagnosis not present

## 2018-05-23 DIAGNOSIS — I951 Orthostatic hypotension: Secondary | ICD-10-CM | POA: Diagnosis not present

## 2018-05-23 DIAGNOSIS — Z794 Long term (current) use of insulin: Secondary | ICD-10-CM | POA: Diagnosis not present

## 2018-05-23 DIAGNOSIS — Z723 Lack of physical exercise: Secondary | ICD-10-CM | POA: Diagnosis not present

## 2018-05-23 DIAGNOSIS — C9102 Acute lymphoblastic leukemia, in relapse: Secondary | ICD-10-CM | POA: Diagnosis not present

## 2018-05-23 DIAGNOSIS — R000 Tachycardia, unspecified: Secondary | ICD-10-CM | POA: Diagnosis not present

## 2018-05-23 DIAGNOSIS — I4581 Long QT syndrome: Secondary | ICD-10-CM | POA: Diagnosis not present

## 2018-05-23 DIAGNOSIS — T380X5A Adverse effect of glucocorticoids and synthetic analogues, initial encounter: Secondary | ICD-10-CM | POA: Diagnosis not present

## 2018-05-23 DIAGNOSIS — D709 Neutropenia, unspecified: Secondary | ICD-10-CM | POA: Diagnosis not present

## 2018-05-23 DIAGNOSIS — E785 Hyperlipidemia, unspecified: Secondary | ICD-10-CM | POA: Diagnosis not present

## 2018-05-23 DIAGNOSIS — Z79899 Other long term (current) drug therapy: Secondary | ICD-10-CM | POA: Diagnosis not present

## 2018-05-23 DIAGNOSIS — Z95828 Presence of other vascular implants and grafts: Secondary | ICD-10-CM | POA: Diagnosis not present

## 2018-05-23 DIAGNOSIS — Z8619 Personal history of other infectious and parasitic diseases: Secondary | ICD-10-CM | POA: Diagnosis not present

## 2018-05-23 DIAGNOSIS — E538 Deficiency of other specified B group vitamins: Secondary | ICD-10-CM | POA: Diagnosis not present

## 2018-05-23 DIAGNOSIS — R739 Hyperglycemia, unspecified: Secondary | ICD-10-CM | POA: Diagnosis not present

## 2018-05-30 DIAGNOSIS — T380X5A Adverse effect of glucocorticoids and synthetic analogues, initial encounter: Secondary | ICD-10-CM | POA: Diagnosis not present

## 2018-05-30 DIAGNOSIS — Z794 Long term (current) use of insulin: Secondary | ICD-10-CM | POA: Diagnosis not present

## 2018-05-30 DIAGNOSIS — I951 Orthostatic hypotension: Secondary | ICD-10-CM | POA: Diagnosis not present

## 2018-05-30 DIAGNOSIS — Z95828 Presence of other vascular implants and grafts: Secondary | ICD-10-CM | POA: Diagnosis not present

## 2018-05-30 DIAGNOSIS — E538 Deficiency of other specified B group vitamins: Secondary | ICD-10-CM | POA: Diagnosis not present

## 2018-05-30 DIAGNOSIS — Z79899 Other long term (current) drug therapy: Secondary | ICD-10-CM | POA: Diagnosis not present

## 2018-05-30 DIAGNOSIS — I10 Essential (primary) hypertension: Secondary | ICD-10-CM | POA: Diagnosis not present

## 2018-05-30 DIAGNOSIS — Z9484 Stem cells transplant status: Secondary | ICD-10-CM | POA: Diagnosis not present

## 2018-05-30 DIAGNOSIS — C9102 Acute lymphoblastic leukemia, in relapse: Secondary | ICD-10-CM | POA: Diagnosis not present

## 2018-05-30 DIAGNOSIS — R739 Hyperglycemia, unspecified: Secondary | ICD-10-CM | POA: Diagnosis not present

## 2018-05-30 DIAGNOSIS — R000 Tachycardia, unspecified: Secondary | ICD-10-CM | POA: Diagnosis not present

## 2018-05-30 DIAGNOSIS — Z8619 Personal history of other infectious and parasitic diseases: Secondary | ICD-10-CM | POA: Diagnosis not present

## 2018-05-30 DIAGNOSIS — Z5111 Encounter for antineoplastic chemotherapy: Secondary | ICD-10-CM | POA: Diagnosis not present

## 2018-05-30 DIAGNOSIS — E785 Hyperlipidemia, unspecified: Secondary | ICD-10-CM | POA: Diagnosis not present

## 2018-06-06 DIAGNOSIS — Z79899 Other long term (current) drug therapy: Secondary | ICD-10-CM | POA: Diagnosis not present

## 2018-06-06 DIAGNOSIS — E538 Deficiency of other specified B group vitamins: Secondary | ICD-10-CM | POA: Diagnosis not present

## 2018-06-06 DIAGNOSIS — Z794 Long term (current) use of insulin: Secondary | ICD-10-CM | POA: Diagnosis not present

## 2018-06-06 DIAGNOSIS — T380X5A Adverse effect of glucocorticoids and synthetic analogues, initial encounter: Secondary | ICD-10-CM | POA: Diagnosis not present

## 2018-06-06 DIAGNOSIS — R000 Tachycardia, unspecified: Secondary | ICD-10-CM | POA: Diagnosis not present

## 2018-06-06 DIAGNOSIS — R9431 Abnormal electrocardiogram [ECG] [EKG]: Secondary | ICD-10-CM | POA: Diagnosis not present

## 2018-06-06 DIAGNOSIS — R739 Hyperglycemia, unspecified: Secondary | ICD-10-CM | POA: Diagnosis not present

## 2018-06-06 DIAGNOSIS — C9102 Acute lymphoblastic leukemia, in relapse: Secondary | ICD-10-CM | POA: Diagnosis not present

## 2018-06-06 DIAGNOSIS — R918 Other nonspecific abnormal finding of lung field: Secondary | ICD-10-CM | POA: Diagnosis not present

## 2018-06-06 DIAGNOSIS — Z95828 Presence of other vascular implants and grafts: Secondary | ICD-10-CM | POA: Diagnosis not present

## 2018-06-06 DIAGNOSIS — I951 Orthostatic hypotension: Secondary | ICD-10-CM | POA: Diagnosis not present

## 2018-06-06 DIAGNOSIS — E785 Hyperlipidemia, unspecified: Secondary | ICD-10-CM | POA: Diagnosis not present

## 2018-06-06 DIAGNOSIS — C9001 Multiple myeloma in remission: Secondary | ICD-10-CM | POA: Diagnosis not present

## 2018-06-06 DIAGNOSIS — Z959 Presence of cardiac and vascular implant and graft, unspecified: Secondary | ICD-10-CM | POA: Diagnosis not present

## 2018-06-06 DIAGNOSIS — Z87898 Personal history of other specified conditions: Secondary | ICD-10-CM | POA: Diagnosis not present

## 2018-06-06 DIAGNOSIS — I10 Essential (primary) hypertension: Secondary | ICD-10-CM | POA: Diagnosis not present

## 2018-06-13 DIAGNOSIS — I10 Essential (primary) hypertension: Secondary | ICD-10-CM | POA: Diagnosis not present

## 2018-06-13 DIAGNOSIS — R739 Hyperglycemia, unspecified: Secondary | ICD-10-CM | POA: Diagnosis not present

## 2018-06-13 DIAGNOSIS — Z95828 Presence of other vascular implants and grafts: Secondary | ICD-10-CM | POA: Diagnosis not present

## 2018-06-13 DIAGNOSIS — R918 Other nonspecific abnormal finding of lung field: Secondary | ICD-10-CM | POA: Diagnosis not present

## 2018-06-13 DIAGNOSIS — C9102 Acute lymphoblastic leukemia, in relapse: Secondary | ICD-10-CM | POA: Diagnosis not present

## 2018-06-13 DIAGNOSIS — E538 Deficiency of other specified B group vitamins: Secondary | ICD-10-CM | POA: Diagnosis not present

## 2018-06-13 DIAGNOSIS — Z959 Presence of cardiac and vascular implant and graft, unspecified: Secondary | ICD-10-CM | POA: Diagnosis not present

## 2018-06-13 DIAGNOSIS — Z7984 Long term (current) use of oral hypoglycemic drugs: Secondary | ICD-10-CM | POA: Diagnosis not present

## 2018-06-13 DIAGNOSIS — T380X5A Adverse effect of glucocorticoids and synthetic analogues, initial encounter: Secondary | ICD-10-CM | POA: Diagnosis not present

## 2018-06-13 DIAGNOSIS — R000 Tachycardia, unspecified: Secondary | ICD-10-CM | POA: Diagnosis not present

## 2018-06-13 DIAGNOSIS — R9431 Abnormal electrocardiogram [ECG] [EKG]: Secondary | ICD-10-CM | POA: Diagnosis not present

## 2018-06-13 DIAGNOSIS — Z79899 Other long term (current) drug therapy: Secondary | ICD-10-CM | POA: Diagnosis not present

## 2018-06-13 DIAGNOSIS — Z5111 Encounter for antineoplastic chemotherapy: Secondary | ICD-10-CM | POA: Diagnosis not present

## 2018-06-13 DIAGNOSIS — E785 Hyperlipidemia, unspecified: Secondary | ICD-10-CM | POA: Diagnosis not present

## 2018-06-13 DIAGNOSIS — C9000 Multiple myeloma not having achieved remission: Secondary | ICD-10-CM | POA: Diagnosis not present

## 2018-06-20 DIAGNOSIS — R918 Other nonspecific abnormal finding of lung field: Secondary | ICD-10-CM | POA: Diagnosis not present

## 2018-06-20 DIAGNOSIS — D649 Anemia, unspecified: Secondary | ICD-10-CM | POA: Diagnosis not present

## 2018-06-20 DIAGNOSIS — Z959 Presence of cardiac and vascular implant and graft, unspecified: Secondary | ICD-10-CM | POA: Diagnosis not present

## 2018-06-20 DIAGNOSIS — C9102 Acute lymphoblastic leukemia, in relapse: Secondary | ICD-10-CM | POA: Diagnosis not present

## 2018-06-20 DIAGNOSIS — R000 Tachycardia, unspecified: Secondary | ICD-10-CM | POA: Diagnosis not present

## 2018-06-20 DIAGNOSIS — Z5111 Encounter for antineoplastic chemotherapy: Secondary | ICD-10-CM | POA: Diagnosis not present

## 2018-06-20 DIAGNOSIS — Z95828 Presence of other vascular implants and grafts: Secondary | ICD-10-CM | POA: Diagnosis not present

## 2018-06-20 DIAGNOSIS — Z794 Long term (current) use of insulin: Secondary | ICD-10-CM | POA: Diagnosis not present

## 2018-06-20 DIAGNOSIS — Z8619 Personal history of other infectious and parasitic diseases: Secondary | ICD-10-CM | POA: Diagnosis not present

## 2018-06-20 DIAGNOSIS — Z87898 Personal history of other specified conditions: Secondary | ICD-10-CM | POA: Diagnosis not present

## 2018-06-20 DIAGNOSIS — E0965 Drug or chemical induced diabetes mellitus with hyperglycemia: Secondary | ICD-10-CM | POA: Diagnosis not present

## 2018-06-20 DIAGNOSIS — Z723 Lack of physical exercise: Secondary | ICD-10-CM | POA: Diagnosis not present

## 2018-06-20 DIAGNOSIS — E785 Hyperlipidemia, unspecified: Secondary | ICD-10-CM | POA: Diagnosis not present

## 2018-06-20 DIAGNOSIS — E538 Deficiency of other specified B group vitamins: Secondary | ICD-10-CM | POA: Diagnosis not present

## 2018-06-20 DIAGNOSIS — T380X5A Adverse effect of glucocorticoids and synthetic analogues, initial encounter: Secondary | ICD-10-CM | POA: Diagnosis not present

## 2018-06-20 DIAGNOSIS — I10 Essential (primary) hypertension: Secondary | ICD-10-CM | POA: Diagnosis not present

## 2018-06-20 DIAGNOSIS — Z9484 Stem cells transplant status: Secondary | ICD-10-CM | POA: Diagnosis not present

## 2018-06-20 DIAGNOSIS — E1165 Type 2 diabetes mellitus with hyperglycemia: Secondary | ICD-10-CM | POA: Diagnosis not present

## 2018-06-20 DIAGNOSIS — D696 Thrombocytopenia, unspecified: Secondary | ICD-10-CM | POA: Diagnosis not present

## 2018-06-20 DIAGNOSIS — Z79899 Other long term (current) drug therapy: Secondary | ICD-10-CM | POA: Diagnosis not present

## 2018-06-27 DIAGNOSIS — Z5111 Encounter for antineoplastic chemotherapy: Secondary | ICD-10-CM | POA: Diagnosis not present

## 2018-06-27 DIAGNOSIS — Z79899 Other long term (current) drug therapy: Secondary | ICD-10-CM | POA: Diagnosis not present

## 2018-06-27 DIAGNOSIS — R000 Tachycardia, unspecified: Secondary | ICD-10-CM | POA: Diagnosis not present

## 2018-06-27 DIAGNOSIS — R9431 Abnormal electrocardiogram [ECG] [EKG]: Secondary | ICD-10-CM | POA: Diagnosis not present

## 2018-06-27 DIAGNOSIS — Z959 Presence of cardiac and vascular implant and graft, unspecified: Secondary | ICD-10-CM | POA: Diagnosis not present

## 2018-06-27 DIAGNOSIS — E1165 Type 2 diabetes mellitus with hyperglycemia: Secondary | ICD-10-CM | POA: Diagnosis not present

## 2018-06-27 DIAGNOSIS — T380X5A Adverse effect of glucocorticoids and synthetic analogues, initial encounter: Secondary | ICD-10-CM | POA: Diagnosis not present

## 2018-06-27 DIAGNOSIS — E785 Hyperlipidemia, unspecified: Secondary | ICD-10-CM | POA: Diagnosis not present

## 2018-06-27 DIAGNOSIS — C9102 Acute lymphoblastic leukemia, in relapse: Secondary | ICD-10-CM | POA: Diagnosis not present

## 2018-06-27 DIAGNOSIS — E0965 Drug or chemical induced diabetes mellitus with hyperglycemia: Secondary | ICD-10-CM | POA: Diagnosis not present

## 2018-06-27 DIAGNOSIS — R918 Other nonspecific abnormal finding of lung field: Secondary | ICD-10-CM | POA: Diagnosis not present

## 2018-06-27 DIAGNOSIS — Z9484 Stem cells transplant status: Secondary | ICD-10-CM | POA: Diagnosis not present

## 2018-06-27 DIAGNOSIS — Z87898 Personal history of other specified conditions: Secondary | ICD-10-CM | POA: Diagnosis not present

## 2018-06-27 DIAGNOSIS — M8580 Other specified disorders of bone density and structure, unspecified site: Secondary | ICD-10-CM | POA: Diagnosis not present

## 2018-06-27 DIAGNOSIS — Z723 Lack of physical exercise: Secondary | ICD-10-CM | POA: Diagnosis not present

## 2018-06-27 DIAGNOSIS — Z95828 Presence of other vascular implants and grafts: Secondary | ICD-10-CM | POA: Diagnosis not present

## 2018-06-27 DIAGNOSIS — I951 Orthostatic hypotension: Secondary | ICD-10-CM | POA: Diagnosis not present

## 2018-06-27 DIAGNOSIS — E538 Deficiency of other specified B group vitamins: Secondary | ICD-10-CM | POA: Diagnosis not present

## 2018-06-27 DIAGNOSIS — Z794 Long term (current) use of insulin: Secondary | ICD-10-CM | POA: Diagnosis not present

## 2018-07-02 DIAGNOSIS — E119 Type 2 diabetes mellitus without complications: Secondary | ICD-10-CM | POA: Diagnosis not present

## 2018-07-02 DIAGNOSIS — I10 Essential (primary) hypertension: Secondary | ICD-10-CM | POA: Diagnosis not present

## 2018-07-02 DIAGNOSIS — C9102 Acute lymphoblastic leukemia, in relapse: Secondary | ICD-10-CM | POA: Diagnosis not present

## 2018-07-02 DIAGNOSIS — C9002 Multiple myeloma in relapse: Secondary | ICD-10-CM | POA: Diagnosis not present

## 2018-07-04 DIAGNOSIS — Z9889 Other specified postprocedural states: Secondary | ICD-10-CM | POA: Diagnosis not present

## 2018-07-04 DIAGNOSIS — R000 Tachycardia, unspecified: Secondary | ICD-10-CM | POA: Diagnosis not present

## 2018-07-04 DIAGNOSIS — E119 Type 2 diabetes mellitus without complications: Secondary | ICD-10-CM | POA: Diagnosis not present

## 2018-07-04 DIAGNOSIS — Z95828 Presence of other vascular implants and grafts: Secondary | ICD-10-CM | POA: Diagnosis not present

## 2018-07-04 DIAGNOSIS — R601 Generalized edema: Secondary | ICD-10-CM | POA: Diagnosis not present

## 2018-07-04 DIAGNOSIS — Z8619 Personal history of other infectious and parasitic diseases: Secondary | ICD-10-CM | POA: Diagnosis not present

## 2018-07-04 DIAGNOSIS — I951 Orthostatic hypotension: Secondary | ICD-10-CM | POA: Diagnosis not present

## 2018-07-04 DIAGNOSIS — E8809 Other disorders of plasma-protein metabolism, not elsewhere classified: Secondary | ICD-10-CM | POA: Diagnosis not present

## 2018-07-04 DIAGNOSIS — Z79899 Other long term (current) drug therapy: Secondary | ICD-10-CM | POA: Diagnosis not present

## 2018-07-04 DIAGNOSIS — C9102 Acute lymphoblastic leukemia, in relapse: Secondary | ICD-10-CM | POA: Diagnosis not present

## 2018-07-04 DIAGNOSIS — C9100 Acute lymphoblastic leukemia not having achieved remission: Secondary | ICD-10-CM | POA: Diagnosis not present

## 2018-07-04 DIAGNOSIS — E538 Deficiency of other specified B group vitamins: Secondary | ICD-10-CM | POA: Diagnosis not present

## 2018-07-04 DIAGNOSIS — Z794 Long term (current) use of insulin: Secondary | ICD-10-CM | POA: Diagnosis not present

## 2018-07-04 DIAGNOSIS — Z723 Lack of physical exercise: Secondary | ICD-10-CM | POA: Diagnosis not present

## 2018-07-04 DIAGNOSIS — Z87898 Personal history of other specified conditions: Secondary | ICD-10-CM | POA: Diagnosis not present

## 2018-07-04 DIAGNOSIS — E785 Hyperlipidemia, unspecified: Secondary | ICD-10-CM | POA: Diagnosis not present

## 2018-07-11 DIAGNOSIS — I951 Orthostatic hypotension: Secondary | ICD-10-CM | POA: Diagnosis not present

## 2018-07-11 DIAGNOSIS — C9102 Acute lymphoblastic leukemia, in relapse: Secondary | ICD-10-CM | POA: Diagnosis not present

## 2018-07-11 DIAGNOSIS — E785 Hyperlipidemia, unspecified: Secondary | ICD-10-CM | POA: Diagnosis not present

## 2018-07-11 DIAGNOSIS — I517 Cardiomegaly: Secondary | ICD-10-CM | POA: Diagnosis not present

## 2018-07-11 DIAGNOSIS — R791 Abnormal coagulation profile: Secondary | ICD-10-CM | POA: Diagnosis not present

## 2018-07-11 DIAGNOSIS — I10 Essential (primary) hypertension: Secondary | ICD-10-CM | POA: Diagnosis not present

## 2018-07-11 DIAGNOSIS — E538 Deficiency of other specified B group vitamins: Secondary | ICD-10-CM | POA: Diagnosis not present

## 2018-07-11 DIAGNOSIS — Z79899 Other long term (current) drug therapy: Secondary | ICD-10-CM | POA: Diagnosis not present

## 2018-07-17 DIAGNOSIS — C9100 Acute lymphoblastic leukemia not having achieved remission: Secondary | ICD-10-CM | POA: Diagnosis not present

## 2018-07-18 DIAGNOSIS — D696 Thrombocytopenia, unspecified: Secondary | ICD-10-CM | POA: Diagnosis not present

## 2018-07-18 DIAGNOSIS — R000 Tachycardia, unspecified: Secondary | ICD-10-CM | POA: Diagnosis not present

## 2018-07-18 DIAGNOSIS — L8996 Pressure-induced deep tissue damage of unspecified site: Secondary | ICD-10-CM | POA: Diagnosis not present

## 2018-07-18 DIAGNOSIS — E669 Obesity, unspecified: Secondary | ICD-10-CM | POA: Diagnosis present

## 2018-07-18 DIAGNOSIS — E119 Type 2 diabetes mellitus without complications: Secondary | ICD-10-CM | POA: Diagnosis not present

## 2018-07-18 DIAGNOSIS — D6959 Other secondary thrombocytopenia: Secondary | ICD-10-CM | POA: Diagnosis not present

## 2018-07-18 DIAGNOSIS — R140 Abdominal distension (gaseous): Secondary | ICD-10-CM | POA: Diagnosis not present

## 2018-07-18 DIAGNOSIS — D630 Anemia in neoplastic disease: Secondary | ICD-10-CM | POA: Diagnosis not present

## 2018-07-18 DIAGNOSIS — E785 Hyperlipidemia, unspecified: Secondary | ICD-10-CM | POA: Diagnosis present

## 2018-07-18 DIAGNOSIS — K7469 Other cirrhosis of liver: Secondary | ICD-10-CM | POA: Diagnosis not present

## 2018-07-18 DIAGNOSIS — Z8579 Personal history of other malignant neoplasms of lymphoid, hematopoietic and related tissues: Secondary | ICD-10-CM | POA: Diagnosis not present

## 2018-07-18 DIAGNOSIS — Z833 Family history of diabetes mellitus: Secondary | ICD-10-CM | POA: Diagnosis not present

## 2018-07-18 DIAGNOSIS — C9102 Acute lymphoblastic leukemia, in relapse: Secondary | ICD-10-CM | POA: Diagnosis present

## 2018-07-18 DIAGNOSIS — Z5111 Encounter for antineoplastic chemotherapy: Secondary | ICD-10-CM | POA: Diagnosis not present

## 2018-07-18 DIAGNOSIS — R4182 Altered mental status, unspecified: Secondary | ICD-10-CM | POA: Diagnosis not present

## 2018-07-18 DIAGNOSIS — I454 Nonspecific intraventricular block: Secondary | ICD-10-CM | POA: Diagnosis not present

## 2018-07-18 DIAGNOSIS — D61810 Antineoplastic chemotherapy induced pancytopenia: Secondary | ICD-10-CM | POA: Diagnosis not present

## 2018-07-18 DIAGNOSIS — G4700 Insomnia, unspecified: Secondary | ICD-10-CM | POA: Diagnosis present

## 2018-07-18 DIAGNOSIS — K5900 Constipation, unspecified: Secondary | ICD-10-CM | POA: Diagnosis present

## 2018-07-18 DIAGNOSIS — T451X5A Adverse effect of antineoplastic and immunosuppressive drugs, initial encounter: Secondary | ICD-10-CM | POA: Diagnosis not present

## 2018-07-18 DIAGNOSIS — I4589 Other specified conduction disorders: Secondary | ICD-10-CM | POA: Diagnosis not present

## 2018-07-18 DIAGNOSIS — Z794 Long term (current) use of insulin: Secondary | ICD-10-CM | POA: Diagnosis not present

## 2018-07-18 DIAGNOSIS — K921 Melena: Secondary | ICD-10-CM | POA: Diagnosis not present

## 2018-07-18 DIAGNOSIS — E876 Hypokalemia: Secondary | ICD-10-CM | POA: Diagnosis not present

## 2018-07-18 DIAGNOSIS — D709 Neutropenia, unspecified: Secondary | ICD-10-CM | POA: Diagnosis not present

## 2018-07-18 DIAGNOSIS — I459 Conduction disorder, unspecified: Secondary | ICD-10-CM | POA: Diagnosis not present

## 2018-07-18 DIAGNOSIS — R040 Epistaxis: Secondary | ICD-10-CM | POA: Diagnosis not present

## 2018-07-18 DIAGNOSIS — C9100 Acute lymphoblastic leukemia not having achieved remission: Secondary | ICD-10-CM | POA: Diagnosis not present

## 2018-07-18 DIAGNOSIS — K7689 Other specified diseases of liver: Secondary | ICD-10-CM | POA: Diagnosis not present

## 2018-07-18 DIAGNOSIS — Z8249 Family history of ischemic heart disease and other diseases of the circulatory system: Secondary | ICD-10-CM | POA: Diagnosis not present

## 2018-07-18 DIAGNOSIS — J189 Pneumonia, unspecified organism: Secondary | ICD-10-CM | POA: Diagnosis not present

## 2018-07-18 DIAGNOSIS — K644 Residual hemorrhoidal skin tags: Secondary | ICD-10-CM | POA: Diagnosis present

## 2018-07-18 DIAGNOSIS — R9431 Abnormal electrocardiogram [ECG] [EKG]: Secondary | ICD-10-CM | POA: Diagnosis not present

## 2018-07-18 DIAGNOSIS — E8779 Other fluid overload: Secondary | ICD-10-CM | POA: Diagnosis not present

## 2018-07-18 DIAGNOSIS — K7290 Hepatic failure, unspecified without coma: Secondary | ICD-10-CM | POA: Diagnosis not present

## 2018-07-18 DIAGNOSIS — R188 Other ascites: Secondary | ICD-10-CM | POA: Diagnosis present

## 2018-07-18 DIAGNOSIS — I959 Hypotension, unspecified: Secondary | ICD-10-CM | POA: Diagnosis not present

## 2018-07-18 DIAGNOSIS — Z9484 Stem cells transplant status: Secondary | ICD-10-CM | POA: Diagnosis not present

## 2018-07-18 DIAGNOSIS — E538 Deficiency of other specified B group vitamins: Secondary | ICD-10-CM | POA: Diagnosis present

## 2018-07-18 DIAGNOSIS — I10 Essential (primary) hypertension: Secondary | ICD-10-CM | POA: Diagnosis not present

## 2018-07-18 DIAGNOSIS — D6481 Anemia due to antineoplastic chemotherapy: Secondary | ICD-10-CM | POA: Diagnosis not present

## 2018-07-18 DIAGNOSIS — L89152 Pressure ulcer of sacral region, stage 2: Secondary | ICD-10-CM | POA: Diagnosis not present

## 2018-07-18 DIAGNOSIS — K922 Gastrointestinal hemorrhage, unspecified: Secondary | ICD-10-CM | POA: Diagnosis not present

## 2018-07-18 DIAGNOSIS — L89322 Pressure ulcer of left buttock, stage 2: Secondary | ICD-10-CM | POA: Diagnosis not present

## 2018-07-18 DIAGNOSIS — R935 Abnormal findings on diagnostic imaging of other abdominal regions, including retroperitoneum: Secondary | ICD-10-CM | POA: Diagnosis not present

## 2018-07-18 DIAGNOSIS — E1165 Type 2 diabetes mellitus with hyperglycemia: Secondary | ICD-10-CM | POA: Diagnosis present

## 2018-07-18 DIAGNOSIS — Z79899 Other long term (current) drug therapy: Secondary | ICD-10-CM | POA: Diagnosis not present

## 2018-07-18 DIAGNOSIS — T380X5A Adverse effect of glucocorticoids and synthetic analogues, initial encounter: Secondary | ICD-10-CM | POA: Diagnosis present

## 2018-07-18 DIAGNOSIS — K766 Portal hypertension: Secondary | ICD-10-CM | POA: Diagnosis not present

## 2018-07-18 DIAGNOSIS — Z6833 Body mass index (BMI) 33.0-33.9, adult: Secondary | ICD-10-CM | POA: Diagnosis not present

## 2018-07-18 DIAGNOSIS — K7460 Unspecified cirrhosis of liver: Secondary | ICD-10-CM | POA: Diagnosis present

## 2018-07-28 ENCOUNTER — Other Ambulatory Visit (HOSPITAL_COMMUNITY): Payer: Medicare Other

## 2018-08-04 ENCOUNTER — Ambulatory Visit (HOSPITAL_COMMUNITY): Payer: Medicare Other | Admitting: Hematology

## 2018-09-02 DIAGNOSIS — D500 Iron deficiency anemia secondary to blood loss (chronic): Secondary | ICD-10-CM | POA: Diagnosis not present

## 2018-09-02 DIAGNOSIS — E538 Deficiency of other specified B group vitamins: Secondary | ICD-10-CM | POA: Diagnosis not present

## 2018-09-02 DIAGNOSIS — Z9989 Dependence on other enabling machines and devices: Secondary | ICD-10-CM | POA: Diagnosis not present

## 2018-09-02 DIAGNOSIS — Z9484 Stem cells transplant status: Secondary | ICD-10-CM | POA: Diagnosis not present

## 2018-09-02 DIAGNOSIS — C9102 Acute lymphoblastic leukemia, in relapse: Secondary | ICD-10-CM | POA: Diagnosis not present

## 2018-09-02 DIAGNOSIS — C9100 Acute lymphoblastic leukemia not having achieved remission: Secondary | ICD-10-CM | POA: Diagnosis not present

## 2018-09-02 DIAGNOSIS — Z79899 Other long term (current) drug therapy: Secondary | ICD-10-CM | POA: Diagnosis not present

## 2018-09-02 DIAGNOSIS — R531 Weakness: Secondary | ICD-10-CM | POA: Diagnosis not present

## 2018-09-08 DIAGNOSIS — D696 Thrombocytopenia, unspecified: Secondary | ICD-10-CM | POA: Diagnosis not present

## 2018-09-08 DIAGNOSIS — C9102 Acute lymphoblastic leukemia, in relapse: Secondary | ICD-10-CM | POA: Diagnosis not present

## 2018-09-08 DIAGNOSIS — E119 Type 2 diabetes mellitus without complications: Secondary | ICD-10-CM | POA: Diagnosis not present

## 2018-09-08 DIAGNOSIS — C9002 Multiple myeloma in relapse: Secondary | ICD-10-CM | POA: Diagnosis not present

## 2018-09-08 DIAGNOSIS — B49 Unspecified mycosis: Secondary | ICD-10-CM | POA: Diagnosis not present

## 2018-09-09 DIAGNOSIS — E538 Deficiency of other specified B group vitamins: Secondary | ICD-10-CM | POA: Diagnosis not present

## 2018-09-09 DIAGNOSIS — C9102 Acute lymphoblastic leukemia, in relapse: Secondary | ICD-10-CM | POA: Diagnosis not present

## 2018-09-09 DIAGNOSIS — D696 Thrombocytopenia, unspecified: Secondary | ICD-10-CM | POA: Diagnosis not present

## 2018-09-16 DIAGNOSIS — Z9484 Stem cells transplant status: Secondary | ICD-10-CM | POA: Diagnosis not present

## 2018-09-16 DIAGNOSIS — R000 Tachycardia, unspecified: Secondary | ICD-10-CM | POA: Diagnosis not present

## 2018-09-16 DIAGNOSIS — Z87898 Personal history of other specified conditions: Secondary | ICD-10-CM | POA: Diagnosis not present

## 2018-09-16 DIAGNOSIS — Z95828 Presence of other vascular implants and grafts: Secondary | ICD-10-CM | POA: Diagnosis not present

## 2018-09-16 DIAGNOSIS — Z8619 Personal history of other infectious and parasitic diseases: Secondary | ICD-10-CM | POA: Diagnosis not present

## 2018-09-16 DIAGNOSIS — Z723 Lack of physical exercise: Secondary | ICD-10-CM | POA: Diagnosis not present

## 2018-09-16 DIAGNOSIS — Z9889 Other specified postprocedural states: Secondary | ICD-10-CM | POA: Diagnosis not present

## 2018-09-16 DIAGNOSIS — R601 Generalized edema: Secondary | ICD-10-CM | POA: Diagnosis not present

## 2018-09-16 DIAGNOSIS — K7460 Unspecified cirrhosis of liver: Secondary | ICD-10-CM | POA: Diagnosis not present

## 2018-09-16 DIAGNOSIS — C9100 Acute lymphoblastic leukemia not having achieved remission: Secondary | ICD-10-CM | POA: Diagnosis not present

## 2018-09-16 DIAGNOSIS — D61818 Other pancytopenia: Secondary | ICD-10-CM | POA: Diagnosis not present

## 2018-09-16 DIAGNOSIS — E1165 Type 2 diabetes mellitus with hyperglycemia: Secondary | ICD-10-CM | POA: Diagnosis not present

## 2018-09-16 DIAGNOSIS — I951 Orthostatic hypotension: Secondary | ICD-10-CM | POA: Diagnosis not present

## 2018-09-16 DIAGNOSIS — C9102 Acute lymphoblastic leukemia, in relapse: Secondary | ICD-10-CM | POA: Diagnosis not present

## 2018-09-16 DIAGNOSIS — E785 Hyperlipidemia, unspecified: Secondary | ICD-10-CM | POA: Diagnosis not present

## 2018-09-16 DIAGNOSIS — E8809 Other disorders of plasma-protein metabolism, not elsewhere classified: Secondary | ICD-10-CM | POA: Diagnosis not present

## 2018-09-16 DIAGNOSIS — G9349 Other encephalopathy: Secondary | ICD-10-CM | POA: Diagnosis not present

## 2018-09-16 DIAGNOSIS — E538 Deficiency of other specified B group vitamins: Secondary | ICD-10-CM | POA: Diagnosis not present

## 2018-09-16 DIAGNOSIS — Z79899 Other long term (current) drug therapy: Secondary | ICD-10-CM | POA: Diagnosis not present

## 2018-09-16 DIAGNOSIS — D759 Disease of blood and blood-forming organs, unspecified: Secondary | ICD-10-CM | POA: Diagnosis not present

## 2018-09-16 DIAGNOSIS — C9002 Multiple myeloma in relapse: Secondary | ICD-10-CM | POA: Diagnosis not present

## 2018-09-16 DIAGNOSIS — R918 Other nonspecific abnormal finding of lung field: Secondary | ICD-10-CM | POA: Diagnosis not present

## 2018-09-16 DIAGNOSIS — Z794 Long term (current) use of insulin: Secondary | ICD-10-CM | POA: Diagnosis not present

## 2018-09-19 DIAGNOSIS — C9100 Acute lymphoblastic leukemia not having achieved remission: Secondary | ICD-10-CM | POA: Diagnosis not present

## 2018-09-22 DIAGNOSIS — D62 Acute posthemorrhagic anemia: Secondary | ICD-10-CM | POA: Diagnosis not present

## 2018-09-22 DIAGNOSIS — R188 Other ascites: Secondary | ICD-10-CM | POA: Diagnosis not present

## 2018-09-22 DIAGNOSIS — K7460 Unspecified cirrhosis of liver: Secondary | ICD-10-CM | POA: Diagnosis not present

## 2018-09-22 DIAGNOSIS — Z87898 Personal history of other specified conditions: Secondary | ICD-10-CM | POA: Diagnosis not present

## 2018-09-22 DIAGNOSIS — K7290 Hepatic failure, unspecified without coma: Secondary | ICD-10-CM | POA: Diagnosis not present

## 2018-09-23 DIAGNOSIS — Z5111 Encounter for antineoplastic chemotherapy: Secondary | ICD-10-CM | POA: Diagnosis not present

## 2018-09-23 DIAGNOSIS — E785 Hyperlipidemia, unspecified: Secondary | ICD-10-CM | POA: Diagnosis present

## 2018-09-23 DIAGNOSIS — C9100 Acute lymphoblastic leukemia not having achieved remission: Secondary | ICD-10-CM | POA: Diagnosis not present

## 2018-09-23 DIAGNOSIS — E538 Deficiency of other specified B group vitamins: Secondary | ICD-10-CM | POA: Diagnosis present

## 2018-09-23 DIAGNOSIS — T451X5A Adverse effect of antineoplastic and immunosuppressive drugs, initial encounter: Secondary | ICD-10-CM | POA: Diagnosis present

## 2018-09-23 DIAGNOSIS — K7469 Other cirrhosis of liver: Secondary | ICD-10-CM | POA: Diagnosis not present

## 2018-09-23 DIAGNOSIS — Z794 Long term (current) use of insulin: Secondary | ICD-10-CM | POA: Diagnosis not present

## 2018-09-23 DIAGNOSIS — D696 Thrombocytopenia, unspecified: Secondary | ICD-10-CM | POA: Diagnosis not present

## 2018-09-23 DIAGNOSIS — I951 Orthostatic hypotension: Secondary | ICD-10-CM | POA: Diagnosis present

## 2018-09-23 DIAGNOSIS — Z8579 Personal history of other malignant neoplasms of lymphoid, hematopoietic and related tissues: Secondary | ICD-10-CM | POA: Diagnosis not present

## 2018-09-23 DIAGNOSIS — K7460 Unspecified cirrhosis of liver: Secondary | ICD-10-CM | POA: Diagnosis present

## 2018-09-23 DIAGNOSIS — Z833 Family history of diabetes mellitus: Secondary | ICD-10-CM | POA: Diagnosis not present

## 2018-09-23 DIAGNOSIS — R739 Hyperglycemia, unspecified: Secondary | ICD-10-CM | POA: Diagnosis not present

## 2018-09-23 DIAGNOSIS — E119 Type 2 diabetes mellitus without complications: Secondary | ICD-10-CM | POA: Diagnosis present

## 2018-09-23 DIAGNOSIS — R188 Other ascites: Secondary | ICD-10-CM | POA: Diagnosis present

## 2018-09-23 DIAGNOSIS — C9102 Acute lymphoblastic leukemia, in relapse: Secondary | ICD-10-CM | POA: Diagnosis present

## 2018-09-23 DIAGNOSIS — T380X5A Adverse effect of glucocorticoids and synthetic analogues, initial encounter: Secondary | ICD-10-CM | POA: Diagnosis present

## 2018-09-23 DIAGNOSIS — D61810 Antineoplastic chemotherapy induced pancytopenia: Secondary | ICD-10-CM | POA: Diagnosis present

## 2018-09-24 DIAGNOSIS — C9102 Acute lymphoblastic leukemia, in relapse: Secondary | ICD-10-CM | POA: Diagnosis not present

## 2018-09-24 DIAGNOSIS — D61810 Antineoplastic chemotherapy induced pancytopenia: Secondary | ICD-10-CM | POA: Diagnosis not present

## 2018-09-24 DIAGNOSIS — R739 Hyperglycemia, unspecified: Secondary | ICD-10-CM | POA: Diagnosis not present

## 2018-09-24 DIAGNOSIS — R188 Other ascites: Secondary | ICD-10-CM | POA: Diagnosis not present

## 2018-09-24 DIAGNOSIS — C9100 Acute lymphoblastic leukemia not having achieved remission: Secondary | ICD-10-CM | POA: Diagnosis not present

## 2018-09-25 DIAGNOSIS — C9100 Acute lymphoblastic leukemia not having achieved remission: Secondary | ICD-10-CM | POA: Diagnosis not present

## 2018-09-25 DIAGNOSIS — D61810 Antineoplastic chemotherapy induced pancytopenia: Secondary | ICD-10-CM | POA: Diagnosis not present

## 2018-09-25 DIAGNOSIS — K7469 Other cirrhosis of liver: Secondary | ICD-10-CM | POA: Diagnosis not present

## 2018-09-25 DIAGNOSIS — Z5111 Encounter for antineoplastic chemotherapy: Secondary | ICD-10-CM | POA: Diagnosis not present

## 2018-09-26 DIAGNOSIS — C9100 Acute lymphoblastic leukemia not having achieved remission: Secondary | ICD-10-CM | POA: Diagnosis not present

## 2018-09-30 DIAGNOSIS — Z5111 Encounter for antineoplastic chemotherapy: Secondary | ICD-10-CM | POA: Diagnosis not present

## 2018-09-30 DIAGNOSIS — C9100 Acute lymphoblastic leukemia not having achieved remission: Secondary | ICD-10-CM | POA: Diagnosis not present

## 2018-10-03 DIAGNOSIS — E538 Deficiency of other specified B group vitamins: Secondary | ICD-10-CM | POA: Diagnosis not present

## 2018-10-03 DIAGNOSIS — C9100 Acute lymphoblastic leukemia not having achieved remission: Secondary | ICD-10-CM | POA: Diagnosis not present

## 2018-10-03 DIAGNOSIS — Z9484 Stem cells transplant status: Secondary | ICD-10-CM | POA: Diagnosis not present

## 2018-10-03 DIAGNOSIS — C9102 Acute lymphoblastic leukemia, in relapse: Secondary | ICD-10-CM | POA: Diagnosis not present

## 2018-10-04 ENCOUNTER — Encounter (HOSPITAL_COMMUNITY): Payer: Self-pay | Admitting: *Deleted

## 2018-10-04 ENCOUNTER — Emergency Department (HOSPITAL_COMMUNITY): Payer: Medicare Other

## 2018-10-04 ENCOUNTER — Other Ambulatory Visit: Payer: Self-pay

## 2018-10-04 ENCOUNTER — Inpatient Hospital Stay (HOSPITAL_COMMUNITY)
Admission: EM | Admit: 2018-10-04 | Discharge: 2018-10-23 | DRG: 871 | Disposition: E | Payer: Medicare Other | Attending: Emergency Medicine | Admitting: Emergency Medicine

## 2018-10-04 DIAGNOSIS — R6521 Severe sepsis with septic shock: Secondary | ICD-10-CM | POA: Diagnosis present

## 2018-10-04 DIAGNOSIS — K7290 Hepatic failure, unspecified without coma: Secondary | ICD-10-CM | POA: Diagnosis present

## 2018-10-04 DIAGNOSIS — D61810 Antineoplastic chemotherapy induced pancytopenia: Secondary | ICD-10-CM | POA: Diagnosis not present

## 2018-10-04 DIAGNOSIS — E162 Hypoglycemia, unspecified: Secondary | ICD-10-CM | POA: Diagnosis not present

## 2018-10-04 DIAGNOSIS — R404 Transient alteration of awareness: Secondary | ICD-10-CM | POA: Diagnosis not present

## 2018-10-04 DIAGNOSIS — Z20828 Contact with and (suspected) exposure to other viral communicable diseases: Secondary | ICD-10-CM | POA: Diagnosis present

## 2018-10-04 DIAGNOSIS — R945 Abnormal results of liver function studies: Secondary | ICD-10-CM | POA: Diagnosis present

## 2018-10-04 DIAGNOSIS — Z79899 Other long term (current) drug therapy: Secondary | ICD-10-CM

## 2018-10-04 DIAGNOSIS — R740 Nonspecific elevation of levels of transaminase and lactic acid dehydrogenase [LDH]: Secondary | ICD-10-CM | POA: Diagnosis present

## 2018-10-04 DIAGNOSIS — C9001 Multiple myeloma in remission: Secondary | ICD-10-CM | POA: Diagnosis present

## 2018-10-04 DIAGNOSIS — A419 Sepsis, unspecified organism: Secondary | ICD-10-CM | POA: Diagnosis present

## 2018-10-04 DIAGNOSIS — E7220 Disorder of urea cycle metabolism, unspecified: Secondary | ICD-10-CM | POA: Diagnosis present

## 2018-10-04 DIAGNOSIS — D61818 Other pancytopenia: Secondary | ICD-10-CM | POA: Diagnosis present

## 2018-10-04 DIAGNOSIS — Z66 Do not resuscitate: Secondary | ICD-10-CM | POA: Diagnosis present

## 2018-10-04 DIAGNOSIS — D701 Agranulocytosis secondary to cancer chemotherapy: Secondary | ICD-10-CM

## 2018-10-04 DIAGNOSIS — E440 Moderate protein-calorie malnutrition: Secondary | ICD-10-CM | POA: Diagnosis present

## 2018-10-04 DIAGNOSIS — C9102 Acute lymphoblastic leukemia, in relapse: Secondary | ICD-10-CM

## 2018-10-04 DIAGNOSIS — B955 Unspecified streptococcus as the cause of diseases classified elsewhere: Secondary | ICD-10-CM | POA: Diagnosis present

## 2018-10-04 DIAGNOSIS — D696 Thrombocytopenia, unspecified: Secondary | ICD-10-CM | POA: Diagnosis present

## 2018-10-04 DIAGNOSIS — I459 Conduction disorder, unspecified: Secondary | ICD-10-CM | POA: Diagnosis present

## 2018-10-04 DIAGNOSIS — R4020 Unspecified coma: Secondary | ICD-10-CM | POA: Diagnosis not present

## 2018-10-04 DIAGNOSIS — Z8249 Family history of ischemic heart disease and other diseases of the circulatory system: Secondary | ICD-10-CM

## 2018-10-04 DIAGNOSIS — Z6831 Body mass index (BMI) 31.0-31.9, adult: Secondary | ICD-10-CM

## 2018-10-04 DIAGNOSIS — K7460 Unspecified cirrhosis of liver: Secondary | ICD-10-CM | POA: Diagnosis present

## 2018-10-04 DIAGNOSIS — Z9481 Bone marrow transplant status: Secondary | ICD-10-CM

## 2018-10-04 DIAGNOSIS — R456 Violent behavior: Secondary | ICD-10-CM | POA: Diagnosis not present

## 2018-10-04 DIAGNOSIS — R7881 Bacteremia: Secondary | ICD-10-CM | POA: Diagnosis present

## 2018-10-04 DIAGNOSIS — J9600 Acute respiratory failure, unspecified whether with hypoxia or hypercapnia: Secondary | ICD-10-CM

## 2018-10-04 DIAGNOSIS — A408 Other streptococcal sepsis: Principal | ICD-10-CM | POA: Diagnosis present

## 2018-10-04 DIAGNOSIS — R9431 Abnormal electrocardiogram [ECG] [EKG]: Secondary | ICD-10-CM | POA: Diagnosis not present

## 2018-10-04 DIAGNOSIS — Z794 Long term (current) use of insulin: Secondary | ICD-10-CM

## 2018-10-04 DIAGNOSIS — D689 Coagulation defect, unspecified: Secondary | ICD-10-CM | POA: Diagnosis present

## 2018-10-04 DIAGNOSIS — D709 Neutropenia, unspecified: Secondary | ICD-10-CM | POA: Diagnosis present

## 2018-10-04 DIAGNOSIS — J9601 Acute respiratory failure with hypoxia: Secondary | ICD-10-CM | POA: Diagnosis not present

## 2018-10-04 DIAGNOSIS — R000 Tachycardia, unspecified: Secondary | ICD-10-CM | POA: Diagnosis not present

## 2018-10-04 DIAGNOSIS — Z515 Encounter for palliative care: Secondary | ICD-10-CM | POA: Diagnosis not present

## 2018-10-04 DIAGNOSIS — N179 Acute kidney failure, unspecified: Secondary | ICD-10-CM | POA: Diagnosis not present

## 2018-10-04 DIAGNOSIS — B952 Enterococcus as the cause of diseases classified elsewhere: Secondary | ICD-10-CM | POA: Diagnosis present

## 2018-10-04 DIAGNOSIS — C9100 Acute lymphoblastic leukemia not having achieved remission: Secondary | ICD-10-CM | POA: Diagnosis present

## 2018-10-04 DIAGNOSIS — R451 Restlessness and agitation: Secondary | ICD-10-CM | POA: Diagnosis present

## 2018-10-04 DIAGNOSIS — E43 Unspecified severe protein-calorie malnutrition: Secondary | ICD-10-CM | POA: Diagnosis not present

## 2018-10-04 DIAGNOSIS — R5081 Fever presenting with conditions classified elsewhere: Secondary | ICD-10-CM | POA: Diagnosis present

## 2018-10-04 DIAGNOSIS — R4182 Altered mental status, unspecified: Secondary | ICD-10-CM | POA: Diagnosis not present

## 2018-10-04 DIAGNOSIS — E538 Deficiency of other specified B group vitamins: Secondary | ICD-10-CM | POA: Diagnosis present

## 2018-10-04 DIAGNOSIS — R509 Fever, unspecified: Secondary | ICD-10-CM

## 2018-10-04 DIAGNOSIS — E870 Hyperosmolality and hypernatremia: Secondary | ICD-10-CM | POA: Diagnosis not present

## 2018-10-04 DIAGNOSIS — T451X5A Adverse effect of antineoplastic and immunosuppressive drugs, initial encounter: Secondary | ICD-10-CM | POA: Diagnosis present

## 2018-10-04 DIAGNOSIS — E876 Hypokalemia: Secondary | ICD-10-CM | POA: Diagnosis not present

## 2018-10-04 DIAGNOSIS — Z823 Family history of stroke: Secondary | ICD-10-CM

## 2018-10-04 DIAGNOSIS — E161 Other hypoglycemia: Secondary | ICD-10-CM | POA: Diagnosis not present

## 2018-10-04 DIAGNOSIS — Z9484 Stem cells transplant status: Secondary | ICD-10-CM

## 2018-10-04 DIAGNOSIS — D72819 Decreased white blood cell count, unspecified: Secondary | ICD-10-CM | POA: Diagnosis present

## 2018-10-04 DIAGNOSIS — N390 Urinary tract infection, site not specified: Secondary | ICD-10-CM | POA: Diagnosis present

## 2018-10-04 DIAGNOSIS — E785 Hyperlipidemia, unspecified: Secondary | ICD-10-CM | POA: Diagnosis present

## 2018-10-04 DIAGNOSIS — R0989 Other specified symptoms and signs involving the circulatory and respiratory systems: Secondary | ICD-10-CM | POA: Diagnosis not present

## 2018-10-04 DIAGNOSIS — E119 Type 2 diabetes mellitus without complications: Secondary | ICD-10-CM

## 2018-10-04 DIAGNOSIS — I10 Essential (primary) hypertension: Secondary | ICD-10-CM | POA: Diagnosis present

## 2018-10-04 DIAGNOSIS — G9341 Metabolic encephalopathy: Secondary | ICD-10-CM | POA: Diagnosis present

## 2018-10-04 HISTORY — DX: Unspecified cirrhosis of liver: K74.60

## 2018-10-04 LAB — URINALYSIS, ROUTINE W REFLEX MICROSCOPIC
Bacteria, UA: NONE SEEN
Bilirubin Urine: NEGATIVE
Glucose, UA: NEGATIVE mg/dL
Ketones, ur: 5 mg/dL — AB
Leukocytes,Ua: NEGATIVE
Nitrite: NEGATIVE
Protein, ur: 30 mg/dL — AB
Specific Gravity, Urine: 1.017 (ref 1.005–1.030)
pH: 5 (ref 5.0–8.0)

## 2018-10-04 LAB — COMPREHENSIVE METABOLIC PANEL
ALT: 50 U/L — ABNORMAL HIGH (ref 0–44)
AST: 173 U/L — ABNORMAL HIGH (ref 15–41)
Albumin: 2.9 g/dL — ABNORMAL LOW (ref 3.5–5.0)
Alkaline Phosphatase: 130 U/L — ABNORMAL HIGH (ref 38–126)
Anion gap: 17 — ABNORMAL HIGH (ref 5–15)
BUN: 45 mg/dL — ABNORMAL HIGH (ref 6–20)
CO2: 14 mmol/L — ABNORMAL LOW (ref 22–32)
Calcium: 8.3 mg/dL — ABNORMAL LOW (ref 8.9–10.3)
Chloride: 103 mmol/L (ref 98–111)
Creatinine, Ser: 1.24 mg/dL (ref 0.61–1.24)
GFR calc Af Amer: >60 mL/min (ref >60)
GFR calc non Af Amer: >60 mL/min (ref >60)
Glucose, Bld: 85 mg/dL (ref 70–99)
Potassium: 4.5 mmol/L (ref 3.5–5.1)
Sodium: 134 mmol/L — ABNORMAL LOW (ref 135–145)
Total Bilirubin: 4.5 mg/dL — ABNORMAL HIGH (ref 0.3–1.2)
Total Protein: 5.1 g/dL — ABNORMAL LOW (ref 6.5–8.1)

## 2018-10-04 LAB — RAPID URINE DRUG SCREEN, HOSP PERFORMED
Amphetamines: NOT DETECTED
Barbiturates: NOT DETECTED
Benzodiazepines: NOT DETECTED
Cocaine: NOT DETECTED
Opiates: NOT DETECTED
Tetrahydrocannabinol: NOT DETECTED

## 2018-10-04 LAB — PROTIME-INR
INR: 2.4 — ABNORMAL HIGH (ref 0.8–1.2)
Prothrombin Time: 25.8 seconds — ABNORMAL HIGH (ref 11.4–15.2)

## 2018-10-04 LAB — CBC WITH DIFFERENTIAL/PLATELET
Abs Immature Granulocytes: 0.01 10*3/uL (ref 0.00–0.07)
Basophils Absolute: 0 10*3/uL (ref 0.0–0.1)
Basophils Relative: 0 %
Eosinophils Absolute: 0 10*3/uL (ref 0.0–0.5)
Eosinophils Relative: 0 %
HCT: 18 % — ABNORMAL LOW (ref 39.0–52.0)
Hemoglobin: 6.2 g/dL — CL (ref 13.0–17.0)
Immature Granulocytes: 13 %
Lymphocytes Relative: 13 %
Lymphs Abs: 0 10*3/uL — ABNORMAL LOW (ref 0.7–4.0)
MCH: 33.7 pg (ref 26.0–34.0)
MCHC: 34.4 g/dL (ref 30.0–36.0)
MCV: 97.8 fL (ref 80.0–100.0)
Monocytes Absolute: 0 10*3/uL — ABNORMAL LOW (ref 0.1–1.0)
Monocytes Relative: 25 %
Neutro Abs: 0 10*3/uL — ABNORMAL LOW (ref 1.7–7.7)
Neutrophils Relative %: 49 %
Platelets: 5 10*3/uL — CL (ref 150–400)
RBC: 1.84 MIL/uL — ABNORMAL LOW (ref 4.22–5.81)
RDW: 18.7 % — ABNORMAL HIGH (ref 11.5–15.5)
WBC: 0.1 10*3/uL — CL (ref 4.0–10.5)
nRBC: 0 % (ref 0.0–0.2)

## 2018-10-04 LAB — AMMONIA: Ammonia: 38 umol/L — ABNORMAL HIGH (ref 9–35)

## 2018-10-04 LAB — LACTIC ACID, PLASMA: Lactic Acid, Venous: 7.3 mmol/L (ref 0.5–1.9)

## 2018-10-04 LAB — APTT: aPTT: 37 seconds — ABNORMAL HIGH (ref 24–36)

## 2018-10-04 LAB — SARS CORONAVIRUS 2 BY RT PCR (HOSPITAL ORDER, PERFORMED IN ~~LOC~~ HOSPITAL LAB): SARS Coronavirus 2: NEGATIVE

## 2018-10-04 MED ORDER — SODIUM CHLORIDE 0.9 % IV BOLUS (SEPSIS)
1000.0000 mL | Freq: Once | INTRAVENOUS | Status: AC
  Administered 2018-10-04: 1000 mL via INTRAVENOUS

## 2018-10-04 MED ORDER — LORAZEPAM 1 MG PO TABS: 1.0000 mg | ORAL_TABLET | Freq: Once | ORAL | Status: DC

## 2018-10-04 MED ORDER — VANCOMYCIN HCL IN DEXTROSE 1-5 GM/200ML-% IV SOLN
1000.0000 mg | Freq: Once | INTRAVENOUS | Status: AC
  Administered 2018-10-04: 1000 mg via INTRAVENOUS
  Filled 2018-10-04: qty 200

## 2018-10-04 MED ORDER — LORAZEPAM 2 MG/ML IJ SOLN
1.0000 mg | Freq: Once | INTRAMUSCULAR | Status: AC
  Administered 2018-10-04: 1 mg via INTRAVENOUS
  Filled 2018-10-04: qty 1

## 2018-10-04 MED ORDER — SODIUM CHLORIDE 0.9 % IV SOLN
2.0000 g | Freq: Once | INTRAVENOUS | Status: AC
  Administered 2018-10-04: 2 g via INTRAVENOUS
  Filled 2018-10-04: qty 2

## 2018-10-04 MED ORDER — METRONIDAZOLE IN NACL 5-0.79 MG/ML-% IV SOLN
500.0000 mg | Freq: Once | INTRAVENOUS | Status: AC
  Administered 2018-10-04: 500 mg via INTRAVENOUS
  Filled 2018-10-04: qty 100

## 2018-10-04 MED ORDER — SODIUM CHLORIDE 0.9 % IV SOLN
INTRAVENOUS | Status: DC
  Administered 2018-10-05: 01:00:00 via INTRAVENOUS

## 2018-10-04 MED ORDER — VANCOMYCIN HCL IN DEXTROSE 1-5 GM/200ML-% IV SOLN
1000.0000 mg | Freq: Once | INTRAVENOUS | Status: AC
  Administered 2018-10-05: 1000 mg via INTRAVENOUS
  Filled 2018-10-04: qty 200

## 2018-10-04 NOTE — ED Provider Notes (Signed)
[facility] EMERGENCY DEPARTMENT Provider Note   CSN: [id number] Arrival date & time: 10/05/2018  2147     History   Chief Complaint Chief Complaint  Patient presents with  . Altered Mental Status    HPI John Singleton is a 58 y.o. male.     Patient brought in by EMS.  For being unresponsive.  Patient here with fever.  Met sepsis parameters.  Was noted by his girlfriend to have an altered mental status.  Patient apparently was seen yesterday and had a blood transfusion.  He is followed at [facility] hematology oncology for acute lymphocytic leukemia.  Patient is also known to have diabetes.  Also some story or history of multiple myeloma in remission from in 2017 known to have cirrhosis.  Not sure the cause of that.  Last seen by [place] in May 2019.  Patient warm to the touch.  Not hypotensive but very tachycardic at the sinus tach heart rate in the 140s.     Past Medical History:  Diagnosis Date  . B12 deficiency 10/23/2015  . Cancer ([place])   . Cirrhosis ([place])   . Diabetes mellitus without complication ([place])   . HTN (hypertension)   . Multiple myeloma ([place])   . Multiple myeloma in remission ([place]) 07/30/2015  . Pneumonia   . Sepsis(995.91)   . Stem cells transplant status [facility])     Patient Active Problem List   Diagnosis Date Noted  . Postural hypotension 08/04/2017  . B-cell acute lymphoblastic leukemia (ALL) ([place]) 06/13/2017  . Pancytopenia ([place]) 06/13/2017  . B12 deficiency 10/23/2015  . Multiple myeloma in remission ([place]) 07/30/2015  . DM2 (diabetes mellitus, type 2) ([place]) 07/30/2015  . Thrombocytopenia ([place]) 07/30/2015  . Leucopenia 07/30/2015  . Anemia 07/30/2015  . HTN (hypertension)   . Allergic transfusion reaction 08/18/2012  . H/O stem cell transplant ([place]) 08/07/2012    Past Surgical History:  Procedure Laterality Date  . LIMBAL STEM CELL TRANSPLANT    . NOSE SURGERY     "when I was a child"        Home Medications    Prior to Admission medications    Medication Sig Start Date End Date Taking? Authorizing Provider  albuterol (PROVENTIL HFA;VENTOLIN HFA) 108 (90 Base) MCG/ACT inhaler Inhale into the lungs. 11/19/17   [provider]  Calcium Carbonate-Vitamin D3 600-400 MG-UNIT TABS Take 1,500 mg by mouth 2 (two) times daily.    [provider]  dasatinib (SPRYCEL) 20 MG tablet Take by mouth. 09/11/17   [provider]  insulin glargine (LANTUS) 100 UNIT/ML injection Inject 20 Units into the skin every evening.    [provider]  insulin lispro (HUMALOG KWIKPEN) 100 UNIT/ML KiwkPen Give per sliding scale    [provider]  magnesium oxide (MAG-OX) 400 MG tablet Take 400 mg by mouth 2 (two) times daily.     [provider]  metFORMIN (GLUCOPHAGE) 1000 MG tablet Take by mouth. 10/04/17   [provider]  metoprolol tartrate (LOPRESSOR) 25 MG tablet Take 12.5 mg by mouth 2 (two) times daily.    [provider]  Multiple Vitamin (MULTIVITAMIN WITH MINERALS) TABS tablet Take 1 tablet by mouth daily.    [provider]  simvastatin (ZOCOR) 20 MG tablet **HOLD** until follow up with your physician. Previous instructions: Take 1 tablet (20 mg total) by mouth daily. 10/04/17   [provider]  voriconazole (VFEND) 200 MG tablet Take one tablet (231m) by mouth twice daily  10/04/17   [provider]  voriconazole (VFEND) 50 MG tablet Take 2 tablets (176m) with one 2027mtablet to equal 30072mwice daily 12/13/17   [provider]    Family History No family history on file.  Social History Social History   Tobacco Use  . Smoking status: Never Smoker  . Smokeless tobacco: Never Used  Substance Use Topics  . Alcohol use: No    Comment: occasional  . Drug use: No     Allergies   Patient has no known allergies.   Review of Systems Review of Systems  Unable to perform ROS: Mental status change     Physical Exam Updated Vital Signs  BP 110/90   Pulse (!) 135   Temp (!) 101.1 F (38.4 C)   Resp 20   Ht 1.702 m (_0 )   Wt 89.8 kg   SpO2 100%   BMI 31.01 kg/m   Physical Exam Vitals signs and nursing note reviewed.  Constitutional:      Appearance: Normal appearance. He is well-developed.  HENT:     Head: Normocephalic and atraumatic.  Eyes:     Conjunctiva/sclera: Conjunctivae normal.     Pupils: Pupils are equal, round, and reactive to light.  Neck:     Musculoskeletal: Neck supple.  Cardiovascular:     Rate and Rhythm: Regular rhythm. Tachycardia present.     Heart sounds: No murmur.  Pulmonary:     Effort: Pulmonary effort is normal. No respiratory distress.     Breath sounds: Normal breath sounds.  Abdominal:     Palpations: Abdomen is soft.     Tenderness: There is no abdominal tenderness.  Musculoskeletal:        General: No swelling.  Skin:    General: Skin is warm and dry.     Coloration: Skin is jaundiced.  Neurological:     Mental Status: He is alert.     Comments: Patient moving but not alert nonverbal      ED Treatments / Results  Labs (all labs ordered are listed, but only abnormal results are displayed) Labs Reviewed  COMPREHENSIVE METABOLIC PANEL - Abnormal; Notable for the following components:      Result Value   Sodium 134 (*)    CO2 14 (*)    BUN 45 (*)    Calcium 8.3 (*)    Total Protein 5.1 (*)    Albumin 2.9 (*)    AST 173 (*)    ALT 50 (*)    Alkaline Phosphatase 130 (*)    Total Bilirubin 4.5 (*)    Anion gap 17 (*)    All other components within normal limits  CBC WITH DIFFERENTIAL/PLATELET - Abnormal; Notable for the following components:   WBC 0.1 (*)    RBC 1.84 (*)    Hemoglobin 6.2 (*)    HCT 18.0 (*)    RDW 18.7 (*)    Platelets 5 (*)    Neutro Abs 0.0 (*)    Lymphs Abs 0.0 (*)    Monocytes Absolute 0.0 (*)    All other components within normal limits  APTT - Abnormal; Notable for the following components:   aPTT 37 (*)    All other  components within normal limits  PROTIME-INR - Abnormal; Notable for the following components:   Prothrombin Time 25.8 (*)    INR 2.4 (*)    All other components within normal limits  URINALYSIS, ROUTINE W REFLEX MICROSCOPIC - Abnormal; Notable for  the following components:   Color, Urine AMBER (*)    APPearance HAZY (*)    Hgb urine dipstick LARGE (*)    Ketones, ur 5 (*)    Protein, ur 30 (*)    All other components within normal limits  AMMONIA - Abnormal; Notable for the following components:   Ammonia 38 (*)    All other components within normal limits  LACTIC ACID, PLASMA - Abnormal; Notable for the following components:   Lactic Acid, Venous 7.3 (*)    All other components within normal limits  CULTURE, BLOOD (ROUTINE X 2)  SARS CORONAVIRUS 2 (HOSPITAL ORDER, [place] [facility])  CULTURE, BLOOD (ROUTINE X 2)  URINE CULTURE  RAPID URINE DRUG SCREEN, HOSP PERFORMED  LACTIC ACID, PLASMA    EKG EKG Interpretation  Date/Time:  Saturday October 04 2018 21:53:39 EDT Ventricular Rate:  143 PR Interval:    QRS Duration: 124 QT Interval:  329 QTC Calculation: 508 R Axis:   59 Text Interpretation:  Sinus tachycardia IVCD, consider atypical RBBB Confirmed by Fredia Sorrow [phone]) on 10/18/2018 10:55:51 PM   Radiology Dg Chest Port 1 View  Result Date: 09/26/2018 CLINICAL DATA:  Altered mental status. EXAM: PORTABLE CHEST 1 VIEW COMPARISON:  Radiograph 06/12/2017 FINDINGS: Accessed right chest port with tip in the lower SVC. Heart is normal in size. Unchanged mediastinal contours. Mild central vascular congestion. Questionable peripheral patchy opacity in the left mid lung. No pleural effusion or pneumothorax. No acute osseous abnormalities are seen. Remote bilateral rib fractures. IMPRESSION: 1. Questionable peripheral patchy opacity in the left mid lung. 2. Mild vascular congestion. Electronically Signed   By: Keith Rake M.D.   On: 09/28/2018 22:52     Procedures Procedures (including critical care time)  CRITICAL CARE Performed by: Fredia Sorrow Total critical care time: 30 minutes Critical care time was exclusive of separately billable procedures and treating other patients. Critical care was necessary to treat or prevent imminent or life-threatening deterioration. Critical care was time spent personally by me on the following activities: development of treatment plan with patient and/or surrogate as well as nursing, discussions with consultants, evaluation of patient's response to treatment, examination of patient, obtaining history from patient or surrogate, ordering and performing treatments and interventions, ordering and review of laboratory studies, ordering and review of radiographic studies, pulse oximetry and re-evaluation of patient's condition.   Medications Ordered in ED Medications  vancomycin (VANCOCIN) IVPB 1000 mg/200 mL premix (1,000 mg Intravenous New Bag/Given 09/26/2018 2308)  0.9 %  sodium chloride infusion (has no administration in time range)  vancomycin (VANCOCIN) IVPB 1000 mg/200 mL premix (has no administration in time range)  ceFEPIme (MAXIPIME) 2 g in sodium chloride 0.9 % 100 mL IVPB (0 g Intravenous Stopped 10/05/2018 2320)  metroNIDAZOLE (FLAGYL) IVPB 500 mg (0 mg Intravenous Stopped 10/18/2018 2320)  sodium chloride 0.9 % bolus 1,000 mL (1,000 mLs Intravenous New Bag/Given 10/10/2018 2249)    And  sodium chloride 0.9 % bolus 1,000 mL (1,000 mLs Intravenous New Bag/Given 10/13/2018 2303)    And  sodium chloride 0.9 % bolus 1,000 mL (1,000 mLs Intravenous New Bag/Given 10/11/2018 2309)  LORazepam (ATIVAN) injection 1 mg (1 mg Intravenous Given 10/10/2018 2249)     Initial Impression / Assessment and Plan / ED Course  I have reviewed the triage vital signs and the nursing notes.  Pertinent labs & imaging results that were available during my care of the patient were reviewed by me and considered  in my medical decision  making (see chart for details).        Patient met sepsis criteria was febrile tachycardic never hypotensive.  Lactic acid is very elevated at 7.  Patient started on broad-spectrum antibiotics.  He is very neutropenic.  Chest x-ray raises concerns for left-sided pneumonia.  COVID testing was negative.  Patient will get head CT to evaluate they decreased mental status.  Patient will also get arterial blood gas.  Patient receiving 30 cc/kg of fluid.  Patient clearly will require admission.  Is a question whether patient will need to be transferred back to [facility] where he is followed by hematology oncology.  Patient is also known to have cirrhosis.  Does appear jaundiced.  His liver function tests are suggestive of that.  Patient will be turned over to Dr. Tomi Bamberger the overnight emergency physician for final disposition.  Final Clinical Impressions(s) / ED Diagnoses   Final diagnoses:  Sepsis, due to unspecified organism, unspecified whether acute organ dysfunction present ([place])  Altered mental status, unspecified altered mental status type  Acute lymphoblastic leukemia (ALL) in relapse [facility])    ED Discharge Orders    None       Fredia Sorrow, MD 10/05/18 0003

## 2018-10-04 NOTE — ED Notes (Signed)
CRITICAL VALUE ALERT  Critical Value: Lactic Acid 7.3  Date & Time Notied: 10/12/2018 2305  Provider Notified: Dr. Rogene Houston  Orders Received/Actions taken: n/a

## 2018-10-04 NOTE — ED Triage Notes (Signed)
Pt's ex girlfriend found pt sitting in chair at his residence today with altered mental status. Pt last spoke to family 3 days ago, per epic pt has office visit at [facility] yesterday., per ems pt was agitated with them enroute to er,

## 2018-10-05 ENCOUNTER — Inpatient Hospital Stay (HOSPITAL_COMMUNITY): Payer: Medicare Other

## 2018-10-05 ENCOUNTER — Encounter (HOSPITAL_COMMUNITY): Payer: Self-pay | Admitting: Internal Medicine

## 2018-10-05 DIAGNOSIS — B955 Unspecified streptococcus as the cause of diseases classified elsewhere: Secondary | ICD-10-CM | POA: Diagnosis not present

## 2018-10-05 DIAGNOSIS — I10 Essential (primary) hypertension: Secondary | ICD-10-CM | POA: Diagnosis present

## 2018-10-05 DIAGNOSIS — C9100 Acute lymphoblastic leukemia not having achieved remission: Secondary | ICD-10-CM | POA: Diagnosis present

## 2018-10-05 DIAGNOSIS — Z9484 Stem cells transplant status: Secondary | ICD-10-CM | POA: Diagnosis not present

## 2018-10-05 DIAGNOSIS — C9001 Multiple myeloma in remission: Secondary | ICD-10-CM | POA: Diagnosis present

## 2018-10-05 DIAGNOSIS — J9690 Respiratory failure, unspecified, unspecified whether with hypoxia or hypercapnia: Secondary | ICD-10-CM | POA: Diagnosis not present

## 2018-10-05 DIAGNOSIS — R945 Abnormal results of liver function studies: Secondary | ICD-10-CM | POA: Diagnosis present

## 2018-10-05 DIAGNOSIS — E870 Hyperosmolality and hypernatremia: Secondary | ICD-10-CM | POA: Diagnosis not present

## 2018-10-05 DIAGNOSIS — D61810 Antineoplastic chemotherapy induced pancytopenia: Secondary | ICD-10-CM | POA: Diagnosis present

## 2018-10-05 DIAGNOSIS — E43 Unspecified severe protein-calorie malnutrition: Secondary | ICD-10-CM | POA: Diagnosis present

## 2018-10-05 DIAGNOSIS — J9601 Acute respiratory failure with hypoxia: Secondary | ICD-10-CM | POA: Diagnosis not present

## 2018-10-05 DIAGNOSIS — A419 Sepsis, unspecified organism: Secondary | ICD-10-CM

## 2018-10-05 DIAGNOSIS — Z515 Encounter for palliative care: Secondary | ICD-10-CM | POA: Diagnosis not present

## 2018-10-05 DIAGNOSIS — Z20828 Contact with and (suspected) exposure to other viral communicable diseases: Secondary | ICD-10-CM | POA: Diagnosis present

## 2018-10-05 DIAGNOSIS — E119 Type 2 diabetes mellitus without complications: Secondary | ICD-10-CM | POA: Diagnosis present

## 2018-10-05 DIAGNOSIS — R5081 Fever presenting with conditions classified elsewhere: Secondary | ICD-10-CM

## 2018-10-05 DIAGNOSIS — E7220 Disorder of urea cycle metabolism, unspecified: Secondary | ICD-10-CM | POA: Diagnosis present

## 2018-10-05 DIAGNOSIS — R4182 Altered mental status, unspecified: Secondary | ICD-10-CM | POA: Diagnosis not present

## 2018-10-05 DIAGNOSIS — Z9481 Bone marrow transplant status: Secondary | ICD-10-CM | POA: Diagnosis not present

## 2018-10-05 DIAGNOSIS — D709 Neutropenia, unspecified: Secondary | ICD-10-CM

## 2018-10-05 DIAGNOSIS — E538 Deficiency of other specified B group vitamins: Secondary | ICD-10-CM | POA: Diagnosis present

## 2018-10-05 DIAGNOSIS — R6521 Severe sepsis with septic shock: Secondary | ICD-10-CM | POA: Diagnosis present

## 2018-10-05 DIAGNOSIS — D689 Coagulation defect, unspecified: Secondary | ICD-10-CM | POA: Diagnosis present

## 2018-10-05 DIAGNOSIS — G9341 Metabolic encephalopathy: Secondary | ICD-10-CM | POA: Diagnosis present

## 2018-10-05 DIAGNOSIS — K7460 Unspecified cirrhosis of liver: Secondary | ICD-10-CM | POA: Diagnosis present

## 2018-10-05 DIAGNOSIS — D61818 Other pancytopenia: Secondary | ICD-10-CM | POA: Diagnosis not present

## 2018-10-05 DIAGNOSIS — Z66 Do not resuscitate: Secondary | ICD-10-CM | POA: Diagnosis present

## 2018-10-05 DIAGNOSIS — R188 Other ascites: Secondary | ICD-10-CM | POA: Diagnosis not present

## 2018-10-05 DIAGNOSIS — N390 Urinary tract infection, site not specified: Secondary | ICD-10-CM | POA: Diagnosis present

## 2018-10-05 DIAGNOSIS — N179 Acute kidney failure, unspecified: Secondary | ICD-10-CM | POA: Diagnosis not present

## 2018-10-05 DIAGNOSIS — R7881 Bacteremia: Secondary | ICD-10-CM | POA: Diagnosis not present

## 2018-10-05 DIAGNOSIS — A408 Other streptococcal sepsis: Secondary | ICD-10-CM | POA: Diagnosis present

## 2018-10-05 DIAGNOSIS — I459 Conduction disorder, unspecified: Secondary | ICD-10-CM | POA: Diagnosis present

## 2018-10-05 DIAGNOSIS — K828 Other specified diseases of gallbladder: Secondary | ICD-10-CM | POA: Diagnosis not present

## 2018-10-05 LAB — CBC WITH DIFFERENTIAL/PLATELET
Abs Immature Granulocytes: 0 10*3/uL (ref 0.00–0.07)
Basophils Absolute: 0 10*3/uL (ref 0.0–0.1)
Basophils Relative: 0 %
Eosinophils Absolute: 0 10*3/uL (ref 0.0–0.5)
Eosinophils Relative: 0 %
HCT: 15.4 % — ABNORMAL LOW (ref 39.0–52.0)
Hemoglobin: 5.5 g/dL — CL (ref 13.0–17.0)
Immature Granulocytes: 0 %
Lymphocytes Relative: 6 %
Lymphs Abs: 0 10*3/uL — ABNORMAL LOW (ref 0.7–4.0)
MCH: 33.7 pg (ref 26.0–34.0)
MCHC: 35.7 g/dL (ref 30.0–36.0)
MCV: 94.5 fL (ref 80.0–100.0)
Monocytes Absolute: 0 10*3/uL — ABNORMAL LOW (ref 0.1–1.0)
Monocytes Relative: 11 %
Neutro Abs: 0.2 10*3/uL — ABNORMAL LOW (ref 1.7–7.7)
Neutrophils Relative %: 83 %
Platelets: <5 10*3/uL — CL (ref 150–400)
RBC: 1.63 MIL/uL — ABNORMAL LOW (ref 4.22–5.81)
RDW: 18.6 % — ABNORMAL HIGH (ref 11.5–15.5)
WBC: 0.2 10*3/uL — CL (ref 4.0–10.5)
nRBC: 0 % (ref 0.0–0.2)

## 2018-10-05 LAB — COMPREHENSIVE METABOLIC PANEL
ALT: 47 U/L — ABNORMAL HIGH (ref 0–44)
AST: 120 U/L — ABNORMAL HIGH (ref 15–41)
Albumin: 2.2 g/dL — ABNORMAL LOW (ref 3.5–5.0)
Alkaline Phosphatase: 105 U/L (ref 38–126)
Anion gap: 8 (ref 5–15)
BUN: 38 mg/dL — ABNORMAL HIGH (ref 6–20)
CO2: 17 mmol/L — ABNORMAL LOW (ref 22–32)
Calcium: 7.6 mg/dL — ABNORMAL LOW (ref 8.9–10.3)
Chloride: 111 mmol/L (ref 98–111)
Creatinine, Ser: 1.1 mg/dL (ref 0.61–1.24)
GFR calc Af Amer: >60 mL/min (ref >60)
GFR calc non Af Amer: >60 mL/min (ref >60)
Glucose, Bld: 150 mg/dL — ABNORMAL HIGH (ref 70–99)
Potassium: 4 mmol/L (ref 3.5–5.1)
Sodium: 136 mmol/L (ref 135–145)
Total Bilirubin: 5.5 mg/dL — ABNORMAL HIGH (ref 0.3–1.2)
Total Protein: 4.3 g/dL — ABNORMAL LOW (ref 6.5–8.1)

## 2018-10-05 LAB — RETICULOCYTES
Immature Retic Fract: 0 % — ABNORMAL LOW (ref 2.3–15.9)
RBC.: 1.63 MIL/uL — ABNORMAL LOW (ref 4.22–5.81)
Retic Count, Absolute: 4.4 10*3/uL — ABNORMAL LOW (ref 19.0–186.0)
Retic Ct Pct: 0.3 % — ABNORMAL LOW (ref 0.4–3.1)

## 2018-10-05 LAB — SEDIMENTATION RATE: Sed Rate: 130 mm/hr — ABNORMAL HIGH (ref 0–16)

## 2018-10-05 LAB — PREPARE RBC (CROSSMATCH)

## 2018-10-05 LAB — MRSA PCR SCREENING: MRSA by PCR: NEGATIVE

## 2018-10-05 LAB — SAVE SMEAR(SSMR), FOR PROVIDER SLIDE REVIEW

## 2018-10-05 LAB — BLOOD GAS, ARTERIAL
Acid-base deficit: 9.3 mmol/L — ABNORMAL HIGH (ref 0.0–2.0)
Bicarbonate: 17.5 mmol/L — ABNORMAL LOW (ref 20.0–28.0)
FIO2: 21
O2 Saturation: 97.6 %
Patient temperature: 37
pCO2 arterial: 23.7 mmHg — ABNORMAL LOW (ref 32.0–48.0)
pH, Arterial: 7.405 (ref 7.350–7.450)
pO2, Arterial: 106 mmHg (ref 83.0–108.0)

## 2018-10-05 LAB — PROTIME-INR
INR: 3 — ABNORMAL HIGH (ref 0.8–1.2)
Prothrombin Time: 30.3 seconds — ABNORMAL HIGH (ref 11.4–15.2)

## 2018-10-05 LAB — GLUCOSE, CAPILLARY
Glucose-Capillary: 127 mg/dL — ABNORMAL HIGH (ref 70–99)
Glucose-Capillary: 139 mg/dL — ABNORMAL HIGH (ref 70–99)
Glucose-Capillary: 142 mg/dL — ABNORMAL HIGH (ref 70–99)
Glucose-Capillary: 152 mg/dL — ABNORMAL HIGH (ref 70–99)
Glucose-Capillary: 150 mg/dL — ABNORMAL HIGH (ref 70–99)

## 2018-10-05 LAB — PLATELET COUNT: Platelets: 18 10*3/uL — CL (ref 150–400)

## 2018-10-05 LAB — LACTIC ACID, PLASMA: Lactic Acid, Venous: 4.7 mmol/L (ref 0.5–1.9)

## 2018-10-05 LAB — AMMONIA: Ammonia: 37 umol/L — ABNORMAL HIGH (ref 9–35)

## 2018-10-05 LAB — LACTATE DEHYDROGENASE: LDH: 267 U/L — ABNORMAL HIGH (ref 98–192)

## 2018-10-05 LAB — TSH: TSH: 3.229 u[IU]/mL (ref 0.350–4.500)

## 2018-10-05 LAB — CORTISOL: Cortisol, Plasma: >100 ug/dL

## 2018-10-05 MED ORDER — LORAZEPAM 2 MG/ML IJ SOLN
INTRAMUSCULAR | Status: AC
  Filled 2018-10-05: qty 1

## 2018-10-05 MED ORDER — FENTANYL CITRATE (PF) 100 MCG/2ML IJ SOLN
INTRAMUSCULAR | Status: AC
  Filled 2018-10-05: qty 2

## 2018-10-05 MED ORDER — ALBUMIN HUMAN 5 % IV SOLN
12.5000 g | Freq: Once | INTRAVENOUS | Status: AC
  Administered 2018-10-05: 12.5 g via INTRAVENOUS
  Filled 2018-10-05: qty 250

## 2018-10-05 MED ORDER — NOREPINEPHRINE 16 MG/250ML-% IV SOLN
0.0000 ug/min | INTRAVENOUS | Status: DC
  Administered 2018-10-05: 13 ug/min via INTRAVENOUS
  Filled 2018-10-05: qty 250

## 2018-10-05 MED ORDER — HALOPERIDOL LACTATE 5 MG/ML IJ SOLN
2.5000 mg | Freq: Once | INTRAMUSCULAR | Status: AC
  Administered 2018-10-05: 01:00:00 2.5 mg via INTRAVENOUS
  Filled 2018-10-05 (×2): qty 1

## 2018-10-05 MED ORDER — SODIUM CHLORIDE 0.9 % IV BOLUS
1000.0000 mL | Freq: Once | INTRAVENOUS | Status: AC
  Administered 2018-10-05: 02:00:00 1000 mL via INTRAVENOUS

## 2018-10-05 MED ORDER — VANCOMYCIN HCL 10 G IV SOLR
2000.0000 mg | INTRAVENOUS | Status: DC
  Administered 2018-10-06: 2000 mg via INTRAVENOUS
  Filled 2018-10-05 (×3): qty 2000

## 2018-10-05 MED ORDER — HALOPERIDOL LACTATE 5 MG/ML IJ SOLN
0.5000 mg | Freq: Once | INTRAMUSCULAR | Status: AC
  Administered 2018-10-05: 0.5 mg via INTRAVENOUS
  Filled 2018-10-05: qty 1

## 2018-10-05 MED ORDER — TBO-FILGRASTIM 300 MCG/0.5ML ~~LOC~~ SOSY
300.0000 ug | PREFILLED_SYRINGE | Freq: Every day | SUBCUTANEOUS | Status: DC
  Administered 2018-10-05: 300 ug via SUBCUTANEOUS
  Filled 2018-10-05 (×3): qty 0.5

## 2018-10-05 MED ORDER — NOREPINEPHRINE 4 MG/250ML-% IV SOLN: 0.0000 ug/min | INTRAVENOUS | Status: DC

## 2018-10-05 MED ORDER — HYDROCORTISONE NA SUCCINATE PF 100 MG IJ SOLR
100.0000 mg | Freq: Three times a day (TID) | INTRAMUSCULAR | Status: DC
  Administered 2018-10-05 (×3): 100 mg via INTRAVENOUS
  Filled 2018-10-05 (×3): qty 2

## 2018-10-05 MED ORDER — DIPHENHYDRAMINE HCL 50 MG/ML IJ SOLN
25.0000 mg | Freq: Once | INTRAMUSCULAR | Status: AC
  Administered 2018-10-05: 25 mg via INTRAVENOUS
  Filled 2018-10-05: qty 1

## 2018-10-05 MED ORDER — SODIUM CHLORIDE 0.9 % IV SOLN
2.0000 g | Freq: Three times a day (TID) | INTRAVENOUS | Status: DC
  Administered 2018-10-05 – 2018-10-08 (×9): 2 g via INTRAVENOUS
  Filled 2018-10-05 (×10): qty 2

## 2018-10-05 MED ORDER — NOREPINEPHRINE 4 MG/250ML-% IV SOLN
INTRAVENOUS | Status: AC
  Administered 2018-10-05: 4 mg
  Filled 2018-10-05: qty 250

## 2018-10-05 MED ORDER — CHLORHEXIDINE GLUCONATE CLOTH 2 % EX PADS
6.0000 | MEDICATED_PAD | Freq: Every day | CUTANEOUS | Status: DC
  Administered 2018-10-05 – 2018-10-08 (×4): 6 via TOPICAL

## 2018-10-05 MED ORDER — PHENYLEPHRINE HCL-NACL 10-0.9 MG/250ML-% IV SOLN
0.0000 ug/min | INTRAVENOUS | Status: DC
  Administered 2018-10-05: 06:00:00 400 ug/min via INTRAVENOUS
  Administered 2018-10-05: 20 ug/min via INTRAVENOUS
  Filled 2018-10-05 (×2): qty 250

## 2018-10-05 MED ORDER — LACTULOSE 10 GM/15ML PO SOLN
20.0000 g | Freq: Two times a day (BID) | ORAL | Status: DC
  Filled 2018-10-05: qty 30

## 2018-10-05 MED ORDER — MIDAZOLAM HCL 2 MG/2ML IJ SOLN
INTRAMUSCULAR | Status: AC
  Filled 2018-10-05: qty 2

## 2018-10-05 MED ORDER — ACETAMINOPHEN 10 MG/ML IV SOLN
1000.0000 mg | Freq: Four times a day (QID) | INTRAVENOUS | Status: DC
  Administered 2018-10-05: 1000 mg via INTRAVENOUS
  Filled 2018-10-05 (×5): qty 100

## 2018-10-05 MED ORDER — SODIUM CHLORIDE 0.9% IV SOLUTION: Freq: Once | INTRAVENOUS | Status: DC

## 2018-10-05 MED ORDER — LORAZEPAM 2 MG/ML IJ SOLN
1.0000 mg | Freq: Once | INTRAMUSCULAR | Status: AC
  Administered 2018-10-05: 1 mg via INTRAVENOUS
  Filled 2018-10-05: qty 1

## 2018-10-05 MED ORDER — SODIUM CHLORIDE 0.9 % IV SOLN: 2.0000 g | Freq: Once | INTRAVENOUS | Status: DC

## 2018-10-05 MED ORDER — LORAZEPAM 2 MG/ML IJ SOLN
1.0000 mg | Freq: Once | INTRAMUSCULAR | Status: AC
  Administered 2018-10-05: 1 mg via INTRAVENOUS

## 2018-10-05 MED ORDER — LACTULOSE ENEMA
300.0000 mL | Freq: Two times a day (BID) | ORAL | Status: DC
  Administered 2018-10-06: 100 mL via RECTAL
  Administered 2018-10-06: 200 mL via RECTAL
  Filled 2018-10-05 (×4): qty 300

## 2018-10-05 MED ORDER — PHENYLEPHRINE HCL-NACL 10-0.9 MG/250ML-% IV SOLN
INTRAVENOUS | Status: AC
  Filled 2018-10-05: qty 250

## 2018-10-05 NOTE — ED Notes (Signed)
Blood gas was obtained on room air.

## 2018-10-05 NOTE — Consult Note (Signed)
[facility] CONSULT NOTE  Patient Care Team: [name], MD as PCP - General (Internal Medicine)  ASSESSMENT & PLAN:   Recurrent B-cell ALL He had received recent chemotherapy recently I spoke with the hematologist on-call for leukemic services [facility].  Unfortunately, they are not able to accept the patient for transfer For now, we will continue aggressive supportive care  Severe pancytopenia Likely due to recent chemotherapy Recurrent leukemia in his bone marrow cannot be excluded  Blood and platelet transfusions were ordered I recommend blood transfusion to keep hemoglobin greater than 7 if possible and platelet count greater than 10,000, unless he has signs of bleeding I will order daily G-CSF until [place] is greater than 1.5  Known liver cirrhosis with signs of liver failure and coagulopathy Continue supportive care. Consider GI consult to follow  Lactic acidosis and altered mental status Likely due to liver failure Infection cannot be excluded At baseline, he has been receiving acyclovir, voriconazole as well as levofloxacin for antimicrobial prophylaxis Will defer to primary service/infectious disease for choice of antimicrobial therapy  Goals of care CODE STATUS I discussed goals of care with his brothers For now, they want him to receive aggressive supportive care but they agreed for do not intubation  Discharge planning He is acutely ill I recommend transfer to [facility] once he is stabilized and bed is available Please call if questions arise   All questions were answered. The patient knows to call the clinic with any problems, questions or concerns.  Heath Lark, MD 10/05/2018 9:03 AM    CHIEF COMPLAINTS/PURPOSE OF CONSULTATION:  Recurrence B-cell ALL, severe pancytopenia, altered mental status  HISTORY OF PRESENTING ILLNESS:  I was consulted by ICU team for further evaluation on the  patient who had received recent chemotherapy, presented with altered mental status and pancytopenia The patient was not able to give any history His 2 brothers are present I was able to review his medical records The patient had history of B-cell ALL, had received chemotherapy at [facility] recently.  He had received some induction chemotherapy followed by pegylated asparaginase, last received on September 30, 2018.  He has been receiving blood transfusion support as well as growth factor injection at [facility]. 1 of his brothers, who lives with him, noted that the patient has started to complain about feeling weakness with poor appetite and excessive fatigue.  This started 2 days ago.  He received blood transfusion on October 03, 2018 Yesterday, the patient started to have progressive mental decline.  He was brought to the local emergency department and was subsequently transferred here for further evaluation and management. The patient had received intravenous fluid resuscitation since admission.  He was started on pressor support due to borderline hypotension.  The patient is somewhat unresponsive.  Blood transfusion and platelet transfusions were ordered. There is no documented fever so far. The patient has history of insulin-dependent diabetes and liver cirrhosis requiring recurrent paracentesis Clinically, he appears jaundiced.  1 of his brother noted that he has progressive abdominal distention lately.  There were no reported recent nausea or changes in bowel habits.  MEDICAL HISTORY:  Past Medical History:  Diagnosis Date  . B12 deficiency 10/23/2015  . Cancer ([place])   . Cirrhosis ([place])   . Diabetes mellitus without complication ([place])   . HTN (hypertension)   . Multiple myeloma ([place])   . Multiple myeloma in remission ([place]) 07/30/2015  . Pneumonia   .  Sepsis(995.91)   . Stem cells transplant status ([place])     SURGICAL HISTORY: Past Surgical History:   Procedure Laterality Date  . LIMBAL STEM CELL TRANSPLANT    . NOSE SURGERY     "when I was a child"    SOCIAL HISTORY: Social History   Socioeconomic History  . Marital status: Divorced    Spouse name: Not on file  . Number of children: Not on file  . Years of education: Not on file  . Highest education level: Not on file  Occupational History  . Not on file  Social Needs  . Financial resource strain: Not on file  . Food insecurity    Worry: Not on file    Inability: Not on file  . Transportation needs    Medical: Not on file    Non-medical: Not on file  Tobacco Use  . Smoking status: Never Smoker  . Smokeless tobacco: Never Used  Substance and Sexual Activity  . Alcohol use: No    Comment: occasional  . Drug use: No  . Sexual activity: Yes    Birth control/protection: None  Lifestyle  . Physical activity    Days per week: Not on file    Minutes per session: Not on file  . Stress: Not on file  Relationships  . Social Herbalist on phone: Not on file    Gets together: Not on file    Attends religious service: Not on file    Active member of club or organization: Not on file    Attends meetings of clubs or organizations: Not on file    Relationship status: Not on file  . Intimate partner violence    Fear of current or ex partner: Not on file    Emotionally abused: Not on file    Physically abused: Not on file    Forced sexual activity: Not on file  Other Topics Concern  . Not on file  Social History Narrative  . Not on file    FAMILY HISTORY: Family History  Problem Relation Age of Onset  . CAD Mother   . Stroke Father     ALLERGIES:  has No Known Allergies.  MEDICATIONS:  Current Facility-Administered Medications  Medication Dose Route Frequency Provider Last Rate Last Dose  . 0.9 %  sodium chloride infusion (Manually program via Guardrails IV Fluids)   Intravenous Once Rolland Porter, MD      . 0.9 %  sodium chloride infusion    Intravenous Continuous Fredia Sorrow, MD 75 mL/hr at 10/05/18 0114    . albumin human 5 % solution 12.5 g  12.5 g Intravenous Once Scatliffe, Kristen D, MD      . ceFEPIme (MAXIPIME) 2 g in sodium chloride 0.9 % 100 mL IVPB  2 g Intravenous Q8H Elsie Lincoln, MD      . Chlorhexidine Gluconate Cloth 2 % PADS 6 each  6 each Topical Daily Brand Males, MD      . hydrocortisone sodium succinate (SOLU-CORTEF) 100 MG injection 100 mg  100 mg Intravenous Q8H Scatliffe, Kristen D, MD      . norepinephrine (LEVOPHED) 16 mg in 250m premix infusion  0-40 mcg/min Intravenous Titrated YElsie Lincoln MD      . phenylephrine (NEOSYNEPHRINE) 10-0.9 MG/250ML-% infusion  0-400 mcg/min Intravenous Titrated KRolland Porter MD 600 mL/hr at 10/05/18 0600 400 mcg/min at 10/05/18 0600    REVIEW OF SYSTEMS: Unable to obtain.  PHYSICAL EXAMINATION: ECOG PERFORMANCE STATUS: 4 -  Bedbound  Vitals:   10/05/18 0700 10/05/18 0741  BP: (!) 85/61   Pulse: 100   Resp: 10   Temp:  99.6 F (37.6 C)  SpO2: 96%    Filed Weights   10/02/2018 2226  Weight: 198 lb (89.8 kg)    GENERAL: He is not alert.  He appears jaundiced.  He is breathing spontaneously SKIN: He appears jaundiced.  Noted petechiae EYES: His eyes are closed but sclera.  Jaundiced OROPHARYNX: Dry mucous membrane is noted NECK: supple, thyroid normal size, non-tender, without nodularity LYMPH:  no palpable lymphadenopathy in the cervical, axillary or inguinal LUNGS: clear to auscultation and percussion with normal breathing effort HEART: Mild tachycardia, no murmurs and no lower extremity edema ABDOMEN:abdomen soft, distended Musculoskeletal:no cyanosis of digits and no clubbing  PSYCH: Unable to assess.  He is confused NEURO: n unable to assess  LABORATORY DATA:  I have reviewed the data as listed Lab Results  Component Value Date   WBC 0.1 (LL) 10/01/2018   HGB 6.2 (LL) 10/03/2018   HCT 18.0 (L) 10/02/2018   MCV 97.8 10/03/2018   PLT 5 (LL)  10/07/2018   Recent Labs    01/27/18 1309 10/06/2018 2220  NA 135 134*  K 4.0 4.5  CL 102 103  CO2 23 14*  GLUCOSE 158* 85  BUN 16 45*  CREATININE 0.89 1.24  CALCIUM 9.0 8.3*  GFRNONAA >60 >60  GFRAA >60 >60  PROT 6.7 5.1*  ALBUMIN 4.2 2.9*  AST 37 173*  ALT 42 50*  ALKPHOS 87 130*  BILITOT 0.5 4.5*    RADIOGRAPHIC STUDIES: I have personally reviewed the radiological images as listed and agreed with the findings in the report. Ct Head Wo Contrast  Result Date: 10/05/2018 CLINICAL DATA:  Altered mental status, sepsis EXAM: CT HEAD WITHOUT CONTRAST TECHNIQUE: Contiguous axial images were obtained from the base of the skull through the vertex without intravenous contrast. COMPARISON:  None. FINDINGS: Brain: There is no mass, hemorrhage or extra-axial collection. The size and configuration of the ventricles and extra-axial CSF spaces are normal. The brain parenchyma is normal, without acute or chronic infarction. Vascular: No abnormal hyperdensity of the major intracranial arteries or dural venous sinuses. No intracranial atherosclerosis. Skull: The visualized skull base, calvarium and extracranial soft tissues are normal. Sinuses/Orbits: No fluid levels or advanced mucosal thickening of the visualized paranasal sinuses. No mastoid or middle ear effusion. The orbits are normal. IMPRESSION: Normal head CT. Electronically Signed   By: Ulyses Jarred M.D.   On: 10/05/2018 00:56   Dg Chest Port 1 View  Result Date: 10/20/2018 CLINICAL DATA:  Altered mental status. EXAM: PORTABLE CHEST 1 VIEW COMPARISON:  Radiograph 06/12/2017 FINDINGS: Accessed right chest port with tip in the lower SVC. Heart is normal in size. Unchanged mediastinal contours. Mild central vascular congestion. Questionable peripheral patchy opacity in the left mid lung. No pleural effusion or pneumothorax. No acute osseous abnormalities are seen. Remote bilateral rib fractures. IMPRESSION: 1. Questionable peripheral patchy  opacity in the left mid lung. 2. Mild vascular congestion. Electronically Signed   By: Keith Rake M.D.   On: 10/16/2018 22:52

## 2018-10-05 NOTE — Progress Notes (Signed)
Pharmacy Antibiotic Note  John Singleton is a 58 y.o. male admitted on 10/20/2018 with febrile neutropenia.  Pharmacy has been consulted for vancomycin dosing.  Presenting with hypotension. Hx of multiple myeloma, cirrhosis. BCx first reported as GPC in 4/4, now showing NGTD. WBC 0.2, ANC 0.2, LA 4.7, afebrile. Scr 1.10 (CrCl 79 mL/min). Received vancomycin 2g IV yesterday.  Plan: Vancomycin 2 g IV every 24 hours starting on 9/14_0  Continue cefepime 2 g IV every 8 hours Monitor renal fx, cx results, clinical pic  Height: _1  (170.2 cm) Weight: 198 lb (89.8 kg) IBW/kg (Calculated) : 66.1  Temp (24hrs), Avg:100.5 F (38.1 C), Min:98.5 F (36.9 C), Max:101.1 F (38.4 C)  Recent Labs  Lab 10/03/2018 2220 10/05/18 0055 10/05/18 0915  WBC 0.1*  --  0.2*  CREATININE 1.24  --  1.10  LATICACIDVEN 7.3* 4.7*  --     Estimated Creatinine Clearance: 79.2 mL/min (by C-G formula based on SCr of 1.1 mg/dL).    No Known Allergies  Antimicrobials this admission: Vancomycin 9/12 >>  Cefepime 9/12 >>   Dose adjustments this admission: N/A  Microbiology results: 9/12 BCx: GPC on stain, NGTD 9/12 UCx: sent  9/13 MRSA PCR: neg  Thank you for allowing pharmacy to be a part of this patient's care.  Antonietta Jewel, PharmD, BCCCP Clinical Pharmacist  Phone: [phone]  Please check AMION for all [place] phone numbers After 10:00 PM, call [place] [phone] 10/05/2018 3:22 PM

## 2018-10-05 NOTE — ED Provider Notes (Signed)
Patient left at change of shift to get disposition for admission.  He had a CT of the head pending and ABG.  Patient has ALL and is followed at [facility] by Dr Jerrye Noble.  He was seen yesterday and received PRC's.  Who presented tonight with altered mental status and fever.  He is receiving 30 cc/kg IV fluids and IV antibiotics for unknown source, vancomycin, Maxipime, and Flagyl.  1:43-1:52 AM patient discussed with Dr. Marcell Anger, oncologist at [facility] at [facility].  He states unfortunately they do not have any ICU or even stepdown beds.  He asked if he can be admitted here and then transferred later.  2:13 AM Dr James Ivanoff, critical care, states she feels patient can be admitted to [facility] to step down by hospitalist and consult them if needed.  Lab states they only have 1 unit of O- blood and they do not have any platelets.   Patient remained hypotensive despite getting a liter of normal saline after getting the Ativan and Haldol because of his agitation.  He was started on phenyl ephedrine drip.  2:36 Dr Maudie Mercury, hospitalist, will admit  Dr. Maudie Mercury was made aware that patient remained hypotensive despite getting the IV fluids and he has talked to the intensivist about admitting the patient to the ICU at [facility].  CRITICAL CARE Performed by: Yakira Duquette L Kaytelynn Scripter Total critical care time: 31 minutes Critical care time was exclusive of separately billable procedures and treating other patients. Critical care was necessary to treat or prevent imminent or life-threatening deterioration. Critical care was time spent personally by me on the following activities: development of treatment plan with patient and/or surrogate as well as nursing, discussions with consultants, evaluation of patient's response to treatment, examination of patient, obtaining history from patient or surrogate, ordering and performing treatments and interventions, ordering and review of laboratory studies, ordering and review of  radiographic studies, pulse oximetry and re-evaluation of patient's condition.  Results for orders placed or performed during the hospital encounter of 10/15/2018  Blood Culture (routine x 2)   Specimen: Left Antecubital; Blood  Result Value Ref Range   Specimen Description LEFT ANTECUBITAL    Special Requests      BOTTLES DRAWN AEROBIC AND ANAEROBIC Blood Culture results may not be optimal due to an excessive volume of blood received in culture bottles Performed at [facility], [address] [place], [place] [address]    Culture PENDING    Report Status PENDING   SARS Coronavirus 2 [facility] order, Performed in [place] hospital lab) Nasopharyngeal Nasopharyngeal Swab   Specimen: Nasopharyngeal Swab  Result Value Ref Range   SARS Coronavirus 2 NEGATIVE NEGATIVE  Comprehensive metabolic panel  Result Value Ref Range   Sodium 134 (L) 135 - 145 mmol/L   Potassium 4.5 3.5 - 5.1 mmol/L   Chloride 103 98 - 111 mmol/L   CO2 14 (L) 22 - 32 mmol/L   Glucose, Bld 85 70 - 99 mg/dL   BUN 45 (H) 6 - 20 mg/dL   Creatinine, Ser 1.24 0.61 - 1.24 mg/dL   Calcium 8.3 (L) 8.9 - 10.3 mg/dL   Total Protein 5.1 (L) 6.5 - 8.1 g/dL   Albumin 2.9 (L) 3.5 - 5.0 g/dL   AST 173 (H) 15 - 41 U/L   ALT 50 (H) 0 - 44 U/L   Alkaline Phosphatase 130 (H) 38 - 126 U/L   Total Bilirubin 4.5 (H) 0.3 - 1.2 mg/dL   GFR calc non  Af Amer >60 >60 mL/min   GFR calc Af Amer >60 >60 mL/min   Anion gap 17 (H) 5 - 15  CBC WITH DIFFERENTIAL  Result Value Ref Range   WBC 0.1 (LL) 4.0 - 10.5 K/uL   RBC 1.84 (L) 4.22 - 5.81 MIL/uL   Hemoglobin 6.2 (LL) 13.0 - 17.0 g/dL   HCT 18.0 (L) 39.0 - 52.0 %   MCV 97.8 80.0 - 100.0 fL   MCH 33.7 26.0 - 34.0 pg   MCHC 34.4 30.0 - 36.0 g/dL   RDW 18.7 (H) 11.5 - 15.5 %   Platelets 5 (LL) 150 - 400 K/uL   nRBC 0.0 0.0 - 0.2 %   Neutrophils Relative % 49 %   Neutro Abs 0.0 (L) 1.7 - 7.7 K/uL   Lymphocytes Relative 13 %   Lymphs Abs 0.0 (L) 0.7 - 4.0 K/uL   Monocytes Relative  25 %   Monocytes Absolute 0.0 (L) 0.1 - 1.0 K/uL   Eosinophils Relative 0 %   Eosinophils Absolute 0.0 0.0 - 0.5 K/uL   Basophils Relative 0 %   Basophils Absolute 0.0 0.0 - 0.1 K/uL   Immature Granulocytes 13 %   Abs Immature Granulocytes 0.01 0.00 - 0.07 K/uL  APTT  Result Value Ref Range   aPTT 37 (H) 24 - 36 seconds  Protime-INR  Result Value Ref Range   Prothrombin Time 25.8 (H) 11.4 - 15.2 seconds   INR 2.4 (H) 0.8 - 1.2  Urinalysis, Routine w reflex microscopic  Result Value Ref Range   Color, Urine AMBER (A) YELLOW   APPearance HAZY (A) CLEAR   Specific Gravity, Urine 1.017 1.005 - 1.030   pH 5.0 5.0 - 8.0   Glucose, UA NEGATIVE NEGATIVE mg/dL   Hgb urine dipstick LARGE (A) NEGATIVE   Bilirubin Urine NEGATIVE NEGATIVE   Ketones, ur 5 (A) NEGATIVE mg/dL   Protein, ur 30 (A) NEGATIVE mg/dL   Nitrite NEGATIVE NEGATIVE   Leukocytes,Ua NEGATIVE NEGATIVE   RBC / HPF 0-5 0 - 5 RBC/hpf   WBC, UA 0-5 0 - 5 WBC/hpf   Bacteria, UA NONE SEEN NONE SEEN   Squamous Epithelial / LPF 0-5 0 - 5   Mucus PRESENT   Rapid urine drug screen (hospital performed)  Result Value Ref Range   Opiates NONE DETECTED NONE DETECTED   Cocaine NONE DETECTED NONE DETECTED   Benzodiazepines NONE DETECTED NONE DETECTED   Amphetamines NONE DETECTED NONE DETECTED   Tetrahydrocannabinol NONE DETECTED NONE DETECTED   Barbiturates NONE DETECTED NONE DETECTED  Ammonia  Result Value Ref Range   Ammonia 38 (H) 9 - 35 umol/L  Lactic acid, plasma  Result Value Ref Range   Lactic Acid, Venous 7.3 (HH) 0.5 - 1.9 mmol/L  Lactic acid, plasma  Result Value Ref Range   Lactic Acid, Venous 4.7 (HH) 0.5 - 1.9 mmol/L  Blood gas, arterial (at [facility])  Result Value Ref Range   FIO2 21.00    pH, Arterial 7.405 7.350 - 7.450   pCO2 arterial 23.7 (L) 32.0 - 48.0 mmHg   pO2, Arterial 106 83.0 - 108.0 mmHg   Bicarbonate 17.5 (L) 20.0 - 28.0 mmol/L   Acid-base deficit 9.3 (H) 0.0 - 2.0 mmol/L   O2 Saturation 97.6  %   Patient temperature 37.0    Allens test (pass/fail) PASS PASS  Sedimentation rate  Result Value Ref Range   Sed Rate 130 (H) 0 - 16 mm/hr  Type and screen  [place]  Result Value Ref Range   ABO/RH(D) PENDING    Antibody Screen PENDING    Sample Expiration      10/08/2018,2359 Performed at [facility], [place] [address], [place], [place] [address]    Laboratory interpretation all normal except pancytopenia with significant thrombocytopenia, elevated lactic acid minimally elevated ammonia level elevated INR, elevation of LFTs, malnutrition   Ct Head Wo Contrast  Result Date: 10/05/2018 CLINICAL DATA:  Altered mental status, sepsis EXAM: CT HEAD WITHOUT CONTRAST TECHNIQUE: Contiguous axial images were obtained from the base of the skull through the vertex without intravenous contrast. COMPARISON:  None. FINDINGS: Brain: There is no mass, hemorrhage or extra-axial collection. The size and configuration of the ventricles and extra-axial CSF spaces are normal. The brain parenchyma is normal, without acute or chronic infarction. Vascular: No abnormal hyperdensity of the major intracranial arteries or dural venous sinuses. No intracranial atherosclerosis. Skull: The visualized skull base, calvarium and extracranial soft tissues are normal. Sinuses/Orbits: No fluid levels or advanced mucosal thickening of the visualized paranasal sinuses. No mastoid or middle ear effusion. The orbits are normal. IMPRESSION: Normal head CT. Electronically Signed   By: Ulyses Jarred M.D.   On: 10/05/2018 00:56   Dg Chest Port 1 View  Result Date: 10/13/2018 CLINICAL DATA:  Altered mental status. EXAM: PORTABLE CHEST 1 VIEW COMPARISON:  Radiograph 06/12/2017 FINDINGS: Accessed right chest port with tip in the lower SVC. Heart is normal in size. Unchanged mediastinal contours. Mild central vascular congestion. Questionable peripheral patchy opacity in the left mid lung. No pleural effusion or  pneumothorax. No acute osseous abnormalities are seen. Remote bilateral rib fractures. IMPRESSION: 1. Questionable peripheral patchy opacity in the left mid lung. 2. Mild vascular congestion. Electronically Signed   By: Keith Rake M.D.   On: 09/28/2018 22:52   Diagnoses that have been ruled out:  None  Diagnoses that are still under consideration:  None  Final diagnoses:  Sepsis, due to unspecified organism, unspecified whether acute organ dysfunction present ([place])  Altered mental status, unspecified altered mental status type  Acute lymphoblastic leukemia (ALL) in relapse ([place])  Antineoplastic chemotherapy induced pancytopenia [facility])   Plan admission  Rolland Porter, MD, Barbette Or, MD 10/05/18 [phone]

## 2018-10-05 NOTE — Consult Note (Addendum)
..   NAME:  John Singleton, MRN:  [id number], DOB:  02-17-1960, LOS: 0 ADMISSION DATE:  10/19/2018, CONSULTATION DATE:  10/05/2018 REFERRING MD:  Dr Maudie Mercury, CHIEF COMPLAINT:  FEVER   Brief History   58 yr old M w/ PMHx sig for multiple myeloma, HLD, HTN, DM, Cirrhosis presenting with Febrile Neutropenia. LA 7.3>> 4.7 PCCM asked to evaluate due to hypotension post 4L. Transferred from [facility].   Past Medical History  .Marland Kitchen Active Ambulatory Problems    Diagnosis Date Noted  . HTN (hypertension)   . Multiple myeloma in remission ([place]) 07/30/2015  . DM2 (diabetes mellitus, type 2) ([place]) 07/30/2015  . Thrombocytopenia ([place]) 07/30/2015  . Leucopenia 07/30/2015  . Anemia 07/30/2015  . B12 deficiency 10/23/2015  . Allergic transfusion reaction 08/18/2012  . H/O stem cell transplant ([place]) 08/07/2012  . B-cell acute lymphoblastic leukemia (ALL) ([place]) 06/13/2017  . Pancytopenia ([place]) 06/13/2017  . Postural hypotension 08/04/2017   Resolved Ambulatory Problems    Diagnosis Date Noted  . No Resolved Ambulatory Problems   Past Medical History:  Diagnosis Date  . Cancer ([place])   . Cirrhosis ([place])   . Diabetes mellitus without complication ([place])   . Multiple myeloma ([place])   . Pneumonia   . Sepsis(995.91)   . Stem cells transplant status ([place])     Consults:  9/13>>>PCCM Heme/Onc  Procedures:  none  Significant Diagnostic Tests:  CTH w/o contrast FINDINGS: Brain: There is no mass, hemorrhage or extra-axial collection. The size and configuration of the ventricles and extra-axial CSF spaces are normal. The brain parenchyma is normal, without acute or chronic infarction. Vascular: No abnormal hyperdensity of the major intracranial arteries or dural venous sinuses. No intracranial atherosclerosis. Skull: The visualized skull base, calvarium and extracranial soft tissues are normal. Sinuses/Orbits: No fluid levels or advanced mucosal thickening of the visualized paranasal sinuses. No mastoid  or middle ear effusion. The orbits are normal.  CXR 9/12 Accessed right chest port with tip in the lower SVC. Heart is normal in size. Unchanged mediastinal contours. Mild central vascular congestion. Questionable peripheral patchy opacity in the left mid lung. No pleural effusion or pneumothorax. No acute osseous abnormalities are seen. Remote bilateral rib fracture: 1. Questionable peripheral patchy opacity in the left mid lung. 2. Mild vascular congestion.  Micro Data:  9/12>>>SARSCOV2 negative 9/12>>>MRSA PCR negative  Antimicrobials:  9/12>>Vancomycin 9/12>>Flagyl 9/12>>Cefepime   Objective   Blood pressure 101/77, pulse (!) 106, temperature 98.5 F (36.9 C), temperature source Axillary, resp. rate (!) 22, height 5' 7"  (1.702 m), weight 89.8 kg, SpO2 100 %.        Intake/Output Summary (Last 24 hours) at 10/05/2018 1427 Last data filed at 10/05/2018 1358 Gross per 24 hour  Intake 1785.84 ml  Output 700 ml  Net 1085.84 ml   Filed Weights   10/11/2018 2226  Weight: 89.8 kg   Examination:  Gen: Well-developed, well nourished, Moaning in disress HEENT: No icteric sclerae, MMM Neck: supple, ROM intact, no meningeal signs CV: RRR, S1, S2 normal, No rubs, no murmurs, no gallops Pulm: CTAB, No rales, no wheezes, no dullness to percussion  Abd: Soft, BS+, Distended Extm: ROM intact, Peripheral pulses intact, No peripheral edema Skin: Dry, Warm, normal turgor, no jaundice.  Neuro: Localizes pain. Unable to assess for asterixis  Assessment & Plan:  1. Acute Encephalopathy On my evaluation responds to painful stimuli Moans moves left leg and left upper extremity and withdraws from pain Pupils fixed and does not follow during exam +  scleral icterus Plts  5 Normal CTH w/o contrast at 0043  Acute encephalopathy PPJ:[id number] vs Uremic Encephalopathy. Ammonia mildly elevated at 37. LDH 267, Retic count low Plan: F/u smear - Start lactulose  2. Febrile Neutropenia  Pancytopenia WBC 0.1 Hgb 6.2 Plts 5 Plan: Appreciate heme/onc recs: Blood / Platelet transfusion Contact and Droplet precautions Continue on Cefepime IV F/u Blood cx   3. Septic Shock  [facility] 30cc[facility]kg at [facility]  MAP goal >21mHg Neo @ 400 through port R chest NS  Cortisol >100 Plan: Continue on levophed gtt F/u cultures  Trend WBC and fever curve Cont Cefepime IV>>antipseudomonal coverage  4. Liver Cirrhosis patient appears icteric INR 3.0, Albumin 2.9 Plan: F/u coags C/w Albumin  5. Acute Kidney Injury Most likely secondary to hypoperfusion Vs Hepatorenal syndrome Plan: Monitor UOP Avoid nephrotoxic meds  6. Anion Gap Metabolic Acidosis Multiple causes: Lactic Acid, Uremia Plan: Continue IVF Monitor UOP Compensated per last ABG 7.4/23/106/17  Best practice:  Diet: NPO Pain/Anxiety/Delirium protocol (if indicated): not indicated  VAP protocol (if indicated): not intubated DVT prophylaxis: no prophylaxis in lieu of thrombocytopenia GI prophylaxis: no PPI due to significant thrombocytopenia Glucose control: has a h/o DM if BG >183mdl will start ISS Mobility: bedrest Code Status: Limited Resuscitation>> Do not Intubate, no CPR, no Defibrillation per BrSherren Mochaho is HCPOA based on several discussions within the last month the patient had stated that he had been through so much he did not want aggressive measures. Family Communication: Discussed in detail Goals of care with Brother KeGrayland [place] was very clear regarding what patient wanted in this instance>> No CPR no intubation ok with vasopressors and Rx of reversible issues.  Disposition: ICU   Labs   CBC: Recent Labs  Lab 10/17/2018 2220 10/05/18 0915  WBC 0.1* 0.2*  NEUTROABS 0.0* 0.2*  HGB 6.2* 5.5*  HCT 18.0* 15.4*  MCV 97.8 94.5  PLT 5* <5*    Basic Metabolic Panel: Recent Labs  Lab 10/08/2018 2220 10/05/18 0915  NA 134* 136  K 4.5 4.0  CL 103 111  CO2 14* 17*  GLUCOSE 85 150*  BUN 45* 38*   CREATININE 1.24 1.10  CALCIUM 8.3* 7.6*   GFR: Estimated Creatinine Clearance: 79.2 mL/min (by C-G formula based on SCr of 1.1 mg/dL). Recent Labs  Lab 10/03/2018 2220 10/05/18 0055 10/05/18 0915  WBC 0.1*  --  0.2*  LATICACIDVEN 7.3* 4.7*  --     Liver Function Tests: Recent Labs  Lab 09/25/2018 2220 10/05/18 0915  AST 173* 120*  ALT 50* 47*  ALKPHOS 130* 105  BILITOT 4.5* 5.5*  PROT 5.1* 4.3*  ALBUMIN 2.9* 2.2*   No results for input(s): LIPASE, AMYLASE in the last 168 hours. Recent Labs  Lab 10/20/2018 2220 10/05/18 0915  AMMONIA 38* 37*    ABG    Component Value Date/Time   PHART 7.405 10/05/2018 0107   PCO2ART 23.7 (L) 10/05/2018 0107   PO2ART 106 10/05/2018 0107   HCO3 17.5 (L) 10/05/2018 0107   ACIDBASEDEF 9.3 (H) 10/05/2018 0107   O2SAT 97.6 10/05/2018 0107     Coagulation Profile: Recent Labs  Lab 10/05/2018 2220 10/05/18 1136  INR 2.4* 3.0*    Cardiac Enzymes: No results for input(s): CKTOTAL, CKMB, CKMBINDEX, TROPONINI in the last 168 hours.  HbA1C: No results found for: HGBA1C  CBG: Recent Labs  Lab 10/05/18 0743 10/05/18 1141  GLUCAP 127* 139*    Review of Systems:   ..Marland KitchenMarland Kitchenview of Systems  Unable to  perform ROS: Mental status change     Past Medical History  He,  has a past medical history of B12 deficiency (10/23/2015), Cancer ([place]), Cirrhosis ([place]), Diabetes mellitus without complication ([place]), HTN (hypertension), Multiple myeloma ([place]), Multiple myeloma in remission ([place]) (07/30/2015), Pneumonia, Sepsis(995.91), and Stem cells transplant status ([place]).   Surgical History    Past Surgical History:  Procedure Laterality Date  . LIMBAL STEM CELL TRANSPLANT    . NOSE SURGERY     "when I was a child"     Social History   reports that he has never smoked. He has never used smokeless tobacco. He reports that he does not drink alcohol or use drugs.   Family History   His family history includes CAD in his mother; Stroke in his  father.   Allergies No Known Allergies   Home Medications  Prior to Admission medications   Medication Sig Start Date End Date Taking? Authorizing Provider  albuterol (PROVENTIL HFA;VENTOLIN HFA) 108 (90 Base) MCG/ACT inhaler Inhale into the lungs. 11/19/17   [provider]  Calcium Carbonate-Vitamin D3 600-400 MG-UNIT TABS Take 1,500 mg by mouth 2 (two) times daily.    [provider]  dasatinib (SPRYCEL) 20 MG tablet Take by mouth. 09/11/17   [provider]  insulin glargine (LANTUS) 100 UNIT/ML injection Inject 20 Units into the skin every evening.    [provider]  insulin lispro (HUMALOG KWIKPEN) 100 UNIT/ML KiwkPen Give per sliding scale    [provider]  magnesium oxide (MAG-OX) 400 MG tablet Take 400 mg by mouth 2 (two) times daily.     [provider]  metFORMIN (GLUCOPHAGE) 1000 MG tablet Take by mouth. 10/04/17   [provider]  metoprolol tartrate (LOPRESSOR) 25 MG tablet Take 12.5 mg by mouth 2 (two) times daily.    [provider]  Multiple Vitamin (MULTIVITAMIN WITH MINERALS) TABS tablet Take 1 tablet by mouth daily.    [provider]  simvastatin (ZOCOR) 20 MG tablet **HOLD** until follow up with your physician. Previous instructions: Take 1 tablet (20 mg total) by mouth daily. 10/04/17   [provider]  voriconazole (VFEND) 200 MG tablet Take one tablet (246m) by mouth twice daily 10/04/17   [provider]  voriconazole (VFEND) 50 MG tablet Take 2 tablets (1092m with one 2004mablet to equal 300m38mice daily 12/13/17   [provider]     Critical care time: 65 m46s    JoshGilberto Better PGY2, ConeHazel DellPager: [phone] attestation to follow

## 2018-10-05 NOTE — ED Notes (Signed)
Pt HR 150, pt moving all extremities, fighting against restraints. Dr Tomi Bamberger made aware- new orders received.

## 2018-10-05 NOTE — ED Notes (Signed)
CRITICAL VALUE ALERT  Critical Value:  Blood Culture resutls: Gram +cocci on gram strain  Date & Time Notied:  10/05/2018. 1052  Provider Notified: Irven Coe., RN at [facility] unit made aware to make pt's physician aware  Orders Received/Actions taken: n/a

## 2018-10-05 NOTE — ED Notes (Signed)
Report given to [place], RN from [place].

## 2018-10-05 NOTE — Consult Note (Signed)
..   NAME:  John Singleton, MRN:  [id number], DOB:  1960/07/18, LOS: 0 ADMISSION DATE:  10/20/2018, CONSULTATION DATE:  10/05/2018 REFERRING MD:  Dr Maudie Mercury, CHIEF COMPLAINT:  FEVER   Brief History   58 yr old M w/ PMHx sig for multiple myeloma, HLD, HTN, DM, Cirrhosis presenting with Febrile Neutropenia. LA 7.3>> 4.7 PCCM asked to evaluate due to hypotension post 4L. [facility] from [facility]  History of present illness   ( History obtained from EMR and acct of other providers)  58 yr old M w/ PMHx significant for Acute Lymphoblastic Leukemia, HTN, DM, multiple myeloma, Cirrhosis and h/o stem cell transplant presented to [facility] with fever and altered mental status.  Pt is followed by [facility]/ Pt was seen on 9/11 and received a blood transfusion  In the ED: Last seen Patient warm to the touch.  Not hypotensive but very tachycardic at the sinus tach heart rate in the 140s. Was started on broad spectrum antibiotics   Past Medical History  .Marland Kitchen Active Ambulatory Problems    Diagnosis Date Noted  . HTN (hypertension)   . Multiple myeloma in remission ([place]) 07/30/2015  . DM2 (diabetes mellitus, type 2) ([place]) 07/30/2015  . Thrombocytopenia ([place]) 07/30/2015  . Leucopenia 07/30/2015  . Anemia 07/30/2015  . B12 deficiency 10/23/2015  . Allergic transfusion reaction 08/18/2012  . H/O stem cell transplant ([place]) 08/07/2012  . B-cell acute lymphoblastic leukemia (ALL) ([place]) 06/13/2017  . Pancytopenia ([place]) 06/13/2017  . Postural hypotension 08/04/2017   Resolved Ambulatory Problems    Diagnosis Date Noted  . No Resolved Ambulatory Problems   Past Medical History:  Diagnosis Date  . Cancer ([place])   . Cirrhosis ([place])   . Diabetes mellitus without complication ([place])   . Multiple myeloma ([place])   . Pneumonia   . Sepsis(995.91)   . Stem cells transplant status ([place])     Consults:  9/13>>>PCCM  Procedures:  none  Significant Diagnostic Tests:  CTH w/o contrast FINDINGS: Brain: There  is no mass, hemorrhage or extra-axial collection. The size and configuration of the ventricles and extra-axial CSF spaces are normal. The brain parenchyma is normal, without acute or chronic infarction. Vascular: No abnormal hyperdensity of the major intracranial arteries or dural venous sinuses. No intracranial atherosclerosis. Skull: The visualized skull base, calvarium and extracranial soft tissues are normal. Sinuses/Orbits: No fluid levels or advanced mucosal thickening of the visualized paranasal sinuses. No mastoid or middle ear effusion. The orbits are normal.  CXR 9/12 Accessed right chest port with tip in the lower SVC. Heart is normal in size. Unchanged mediastinal contours. Mild central vascular congestion. Questionable peripheral patchy opacity in the left mid lung. No pleural effusion or pneumothorax. No acute osseous abnormalities are seen. Remote bilateral rib fracture: 1. Questionable peripheral patchy opacity in the left mid lung. 2. Mild vascular congestion. Micro Data:  9/12>>>SARSCOV2 negative 9/12>>>MRSA PCR negative  Antimicrobials:  9/12>>Vancomycin 9/12>>Flagyl 9/12>>Cefepime    Objective   Blood pressure (!) 89/61, pulse (!) 111, temperature 99.5 F (37.5 C), resp. rate (!) 35, height 5' 7"  (1.702 m), weight 89.8 kg, SpO2 98 %.       No intake or output data in the 24 hours ending 10/05/18 0557 Filed Weights   10/03/2018 2226  Weight: 89.8 kg    Examination: General: altered responds to noxious stimuli HENT: normocephalic atraumatic +scleral icterus pupils fixed pinpoint w/ rightward gaze Lungs: decreased at bases no wheezing appreciated Cardiovascular: S1 and S2 RRR Abdomen:  soft obese abdomen slightly distended w/ + fluid wave Extremities: +1 edema in b/l lower ext Neuro: altered non verbal withdraws from noxious stimuli on the left.  Skin: yellow   Assessment & Plan:  1. Acute Encephalopathy On my evaluation responds to painful stimuli  Moans moves left leg and left upper extremity and withdraws from pain Pupils fixed and does not follow during exam + scleral icterus Plts  5 Normal CTH w/o contrast at 0043 Acute encephalopathy DDx: HyperAmmonemia vs Metabolic vs Uremic Encephalopathy Plan: F/u smear, retic, LDH Hyperammonemia Ammonia level and Coags given h/o cirrhosis  If Ammonia level elevated place NGT start lactulose BUN 45>> f/u UOP and repeat BUN  2. Febrile Neutropenia Pancytopenia WBC 0.1 Hgb 6.2 Plts 5 Plan: Contact and Droplet precautions Continue on Cefepime IV F/u Blood cx  Consulted Hematology Discussed with Dr Mikey Kirschner   She will see patient this AM and make her recommendations She suspects ALL recurrence  3. Septic Shock Received 30cc/kg at [facility]  MAP goal >23mHg Neo @ 400 through port R chest NS  Plan: F/u cortisol will start stress dose steroids empirically Continue on levophed gtt F/u cultures  Trend WBC and fever curve Cont Cefepime IV>>antipseudomonal coverage  4. Liver Cirrhosis patient appears icteric Albumin 2.9  Plan: F/u coags Will start Albumin F/u Ammonia level  5. Acute Kidney Injury Most likely secondary to hypoperfusion Vs Hepatorenal syndrome Plan: Monitor UOP Avoid nephrotoxic meds  6. Anion Gap Metabolic Acidosis Multiple causes: Lactic Acid, Uremia Plan: Continue IVF Monitor UOP Compensated per last ABG 7.4/23/106/17  Best practice:  Diet: NPO Pain/Anxiety/Delirium protocol (if indicated): not indicated  VAP protocol (if indicated): not intubated DVT prophylaxis: no prophylaxis in lieu of thrombocytopenia GI prophylaxis: no PPI due to significant thrombocytopenia Glucose control: has a h/o DM if BG >1828mdl will start ISS Mobility: bedrest Code Status: Limited Resuscitation>> Do not Intubate, no CPR, no Defibrillation per BrSherren Mochaho is HCPOA based on several discussions within the last month the patient had stated that he had been through so  much he did not want aggressive measures. Family Communication: Discussed in detail Goals of care with Brother KeGrayland Ormonde was very clear regarding what patient wanted in this instance>> No CPR no intubation ok with vasopressors and Rx of reversible issues.  Disposition: ICU   Labs   CBC: Recent Labs  Lab 10/03/2018 2220  WBC 0.1*  NEUTROABS 0.0*  HGB 6.2*  HCT 18.0*  MCV 97.8  PLT 5*    Basic Metabolic Panel: Recent Labs  Lab 10/05/2018 2220  NA 134*  K 4.5  CL 103  CO2 14*  GLUCOSE 85  BUN 45*  CREATININE 1.24  CALCIUM 8.3*   GFR: Estimated Creatinine Clearance: 70.3 mL/min (by C-G formula based on SCr of 1.24 mg/dL). Recent Labs  Lab 10/20/2018 2220 10/05/18 0055  WBC 0.1*  --   LATICACIDVEN 7.3* 4.7*    Liver Function Tests: Recent Labs  Lab 10/20/2018 2220  AST 173*  ALT 50*  ALKPHOS 130*  BILITOT 4.5*  PROT 5.1*  ALBUMIN 2.9*   No results for input(s): LIPASE, AMYLASE in the last 168 hours. Recent Labs  Lab 10/16/2018 2220  AMMONIA 38*    ABG    Component Value Date/Time   PHART 7.405 10/05/2018 0107   PCO2ART 23.7 (L) 10/05/2018 0107   PO2ART 106 10/05/2018 0107   HCO3 17.5 (L) 10/05/2018 0107   ACIDBASEDEF 9.3 (H) 10/05/2018 0107   O2SAT 97.6 10/05/2018 0107  Coagulation Profile: Recent Labs  Lab 10/22/2018 2220  INR 2.4*    Cardiac Enzymes: No results for input(s): CKTOTAL, CKMB, CKMBINDEX, TROPONINI in the last 168 hours.  HbA1C: No results found for: HGBA1C  CBG: No results for input(s): GLUCAP in the last 168 hours.  Review of Systems:   Marland KitchenMarland KitchenReview of Systems  Unable to perform ROS: Mental status change     Past Medical History  He,  has a past medical history of B12 deficiency (10/23/2015), Cancer ([place]), Cirrhosis ([place]), Diabetes mellitus without complication ([place]), HTN (hypertension), Multiple myeloma ([place]), Multiple myeloma in remission ([place]) (07/30/2015), Pneumonia, Sepsis(995.91), and Stem cells transplant status ([place]).    Surgical History    Past Surgical History:  Procedure Laterality Date  . LIMBAL STEM CELL TRANSPLANT    . NOSE SURGERY     "when I was a child"     Social History   reports that he has never smoked. He has never used smokeless tobacco. He reports that he does not drink alcohol or use drugs.   Family History   His family history includes CAD in his mother; Stroke in his father.   Allergies No Known Allergies   Home Medications  Prior to Admission medications   Medication Sig Start Date End Date Taking? Authorizing Provider  albuterol (PROVENTIL HFA;VENTOLIN HFA) 108 (90 Base) MCG/ACT inhaler Inhale into the lungs. 11/19/17   [provider]  Calcium Carbonate-Vitamin D3 600-400 MG-UNIT TABS Take 1,500 mg by mouth 2 (two) times daily.    [provider]  dasatinib (SPRYCEL) 20 MG tablet Take by mouth. 09/11/17   [provider]  insulin glargine (LANTUS) 100 UNIT/ML injection Inject 20 Units into the skin every evening.    [provider]  insulin lispro (HUMALOG KWIKPEN) 100 UNIT/ML KiwkPen Give per sliding scale    [provider]  magnesium oxide (MAG-OX) 400 MG tablet Take 400 mg by mouth 2 (two) times daily.     [provider]  metFORMIN (GLUCOPHAGE) 1000 MG tablet Take by mouth. 10/04/17   [provider]  metoprolol tartrate (LOPRESSOR) 25 MG tablet Take 12.5 mg by mouth 2 (two) times daily.    [provider]  Multiple Vitamin (MULTIVITAMIN WITH MINERALS) TABS tablet Take 1 tablet by mouth daily.    [provider]  simvastatin (ZOCOR) 20 MG tablet **HOLD** until follow up with your physician. Previous instructions: Take 1 tablet (20 mg total) by mouth daily. 10/04/17   [provider]  voriconazole (VFEND) 200 MG tablet Take one tablet (288m) by mouth twice daily 10/04/17   [provider]  voriconazole (VFEND) 50 MG tablet Take 2 tablets (1026m with one 20067mablet to equal  300m66mice daily 12/13/17   [provider]     I, Dr KrisSeward Carole personally reviewed patient's available data, including medical history, events of note, physical examination and test results as part of my evaluation. I have discussed with other care providers such as pharmacist, RN and Elink.  In addition,  I personally evaluated patient The patient is critically ill with multiple organ systems failure and requires high complexity decision making for assessment and support, frequent evaluation and titration of therapies, application of advanced monitoring technologies and extensive interpretation of multiple databases.   Critical Care Time devoted to patient care services described in this note is 65 Minutes. This time reflects time of care of this signee Dr KrisSeward Carolis critical care time does not reflect procedure  time, or teaching time or supervisory time of NP but could involve care discussion time   CC TIME: 65  minutes CODE STATUS:Limited no CPR no intubation ok with vasopressors DISPOSITION :ICU PROGNOSIS:guarded>> Poor   Dr. Seward Carol Pulmonary Critical Care Medicine  10/05/2018 5:57 AM   Critical care time: 65 mins

## 2018-10-05 NOTE — Progress Notes (Signed)
RN started PRBC on resident order of PRBC. Blood bank then called to report blood was not radiated. Patient got 15cc of PRBC - this was not irradiated. So stopped immediately  D/w resident -  -  Then had resident called Dr Alvy Bimler - need XRT blood and she also recommended benadryl pre-transfusion but no need for leukopheresis   - Fresh order for irradiated blood placed - blood bank aware of antibody issues   - Brothers were informed at bedside of the above     SIGNATURE    Dr. Brand Males, M.D., F.C.C.P,  Pulmonary and Critical Care Medicine Staff Physician, [place] Director - Interstitial Lung Disease  Program  [facility] [place] [facility] [place], [place], [address]  Pager: [phone], If no answer or between  15:00h - 7:00h: call [phone] Telephone: [phone]  3:37 PM 10/05/2018

## 2018-10-05 NOTE — Progress Notes (Signed)
ANTIBIOTIC CONSULT NOTE-Preliminary  Pharmacy Consult for vancomycin and cefepime Indication: sepsis  No Known Allergies  Patient Measurements: Height: 5' 7"  (170.2 cm) Weight: 198 lb (89.8 kg) IBW/kg (Calculated) : 66.1 Adjusted Body Weight:   Vital Signs: Temp: 99.5 F (37.5 C) (09/13 0435) Temp Source: Core (09/12 2223) BP: 89/61 (09/13 0435) Pulse Rate: 111 (09/13 0435)  Labs: Recent Labs    10/03/2018 2220  WBC 0.1*  HGB 6.2*  PLT 5*  CREATININE 1.24    Estimated Creatinine Clearance: 70.3 mL/min (by C-G formula based on SCr of 1.24 mg/dL).  No results for input(s): VANCOTROUGH, VANCOPEAK, VANCORANDOM, GENTTROUGH, GENTPEAK, GENTRANDOM, TOBRATROUGH, TOBRAPEAK, TOBRARND, AMIKACINPEAK, AMIKACINTROU, AMIKACIN in the last 72 hours.   Microbiology: Recent Results (from the past 720 hour(s))  SARS Coronavirus 2 [facility] order, Performed in [facility] hospital lab) Nasopharyngeal Nasopharyngeal Swab     Status: None   Collection Time: 10/10/2018 10:43 PM   Specimen: Nasopharyngeal Swab  Result Value Ref Range Status   SARS Coronavirus 2 NEGATIVE NEGATIVE Final    Comment: (NOTE) If result is NEGATIVE SARS-CoV-2 target nucleic acids are NOT DETECTED. The SARS-CoV-2 RNA is generally detectable in upper and lower  respiratory specimens during the acute phase of infection. The lowest  concentration of SARS-CoV-2 viral copies this assay can detect is 250  copies / mL. A negative result does not preclude SARS-CoV-2 infection  and should not be used as the sole basis for treatment or other  patient management decisions.  A negative result may occur with  improper specimen collection / handling, submission of specimen other  than nasopharyngeal swab, presence of viral mutation(s) within the  areas targeted by this assay, and inadequate number of viral copies  (<250 copies / mL). A negative result must be combined with clinical  observations, patient history, and  epidemiological information. If result is POSITIVE SARS-CoV-2 target nucleic acids are DETECTED. The SARS-CoV-2 RNA is generally detectable in upper and lower  respiratory specimens dur ing the acute phase of infection.  Positive  results are indicative of active infection with SARS-CoV-2.  Clinical  correlation with patient history and other diagnostic information is  necessary to determine patient infection status.  Positive results do  not rule out bacterial infection or co-infection with other viruses. If result is PRESUMPTIVE POSTIVE SARS-CoV-2 nucleic acids MAY BE PRESENT.   A presumptive positive result was obtained on the submitted specimen  and confirmed on repeat testing.  While 2019 novel coronavirus  (SARS-CoV-2) nucleic acids may be present in the submitted sample  additional confirmatory testing may be necessary for epidemiological  and / or clinical management purposes  to differentiate between  SARS-CoV-2 and other Sarbecovirus currently known to infect humans.  If clinically indicated additional testing with an alternate test  methodology [phone]) is advised. The SARS-CoV-2 RNA is generally  detectable in upper and lower respiratory sp ecimens during the acute  phase of infection. The expected result is Negative. Fact Sheet for Patients:  StrictlyIdeas.no Fact Sheet for Healthcare Providers: BankingDealers.co.za This test is not yet approved or cleared by the [place] FDA and has been authorized for detection and/or diagnosis of SARS-CoV-2 by FDA under an Emergency Use Authorization (EUA).  This EUA will remain in effect (meaning this test can be used) for the duration of the COVID-19 declaration under Section 564(b)(1) of the Act, 21 U.S.C. section 360bbb-3(b)(1), unless the authorization is terminated or revoked sooner. Performed at [facility], [address] [place], [place] [address]  Blood Culture  (routine x 2)     Status: None (Preliminary result)   Collection Time: 10/05/2018 10:56 PM   Specimen: Left Antecubital; Blood  Result Value Ref Range Status   Specimen Description LEFT ANTECUBITAL  Final   Special Requests   Final    BOTTLES DRAWN AEROBIC AND ANAEROBIC Blood Culture results may not be optimal due to an excessive volume of blood received in culture bottles Performed at [facility], [address] [place], [place] [address]    Culture PENDING  Incomplete   Report Status PENDING  Incomplete    Medical History: Past Medical History:  Diagnosis Date  . B12 deficiency 10/23/2015  . Cancer ([place])   . Cirrhosis ([place])   . Diabetes mellitus without complication ([place])   . HTN (hypertension)   . Multiple myeloma ([place])   . Multiple myeloma in remission ([place]) 07/30/2015  . Pneumonia   . Sepsis(995.91)   . Stem cells transplant status ([place])     Medications:  Anti-infectives (From admission, onward)   Start     Dose/Rate Route Frequency Ordered Stop   10/05/18 0700  ceFEPIme (MAXIPIME) 2 g in sodium chloride 0.9 % 100 mL IVPB  Status:  Discontinued     2 g 200 mL/hr over 30 Minutes Intravenous  Once 10/05/18 0404 10/05/18 0449   10/05/18 0600  ceFEPIme (MAXIPIME) 2 g in sodium chloride 0.9 % 100 mL IVPB     2 g 200 mL/hr over 30 Minutes Intravenous Every 8 hours 10/05/18 0449     10/02/2018 2345  vancomycin (VANCOCIN) IVPB 1000 mg/200 mL premix     1,000 mg 200 mL/hr over 60 Minutes Intravenous  Once 10/03/2018 2338 10/05/18 0224   09/25/2018 2230  ceFEPIme (MAXIPIME) 2 g in sodium chloride 0.9 % 100 mL IVPB     2 g 200 mL/hr over 30 Minutes Intravenous  Once 09/28/2018 2229 10/05/2018 2320   10/13/2018 2230  metroNIDAZOLE (FLAGYL) IVPB 500 mg     500 mg 100 mL/hr over 60 Minutes Intravenous  Once 09/30/2018 2229 09/26/2018 2320   10/08/2018 2230  vancomycin (VANCOCIN) IVPB 1000 mg/200 mL premix     1,000 mg 200 mL/hr over 60 Minutes Intravenous  Once 10/15/2018 2229 10/05/18 0018       Assessment: 58 yo male presented to ED with fever and AMS. Pt has febrile neutropenia,sepsis, hypotension, and pancytopenia. Pharmacy has been asked to dose cefepime and vancomycin.   Goal of Therapy:  Vancomycin trough level 15-20 mcg/ml  Plan:  Preliminary review of pertinent patient information completed.  Protocol will be initiated with dose(s) of vancomycin 2 gram loading dose and cefepime 2 grams Q8 hours.  Forestine Na clinical pharmacist will complete review during morning rounds to assess patient and finalize treatment regimen if needed.  Dalylah Ramey, Magdalene Molly, RPH 10/05/2018,4:50 AM

## 2018-10-05 NOTE — Progress Notes (Signed)
[facility] Progress Note Patient Name: BARTHOLOME PERRO DOB: 1960-11-03 MRN: [id number]   Date of Service  10/05/2018  HPI/Events of Note  Delirium/Restlessness - QTc interval = 0.329 seconds.   eICU Interventions  Will order: 1. Haldol 0.5 mg IV now.      Intervention Category Major Interventions: Delirium, psychosis, severe agitation - evaluation and management;Other:  Lysle Dingwall 10/05/2018, 11:03 PM

## 2018-10-05 NOTE — ED Notes (Signed)
CT called to request nurse come to CT as pt is in restraints. This nurse to CT as requested.

## 2018-10-05 NOTE — Progress Notes (Signed)
CRITICAL VALUE ALERT  Critical Value:  WBC 0.2, HgB 5.5, Platelet 3  Date & Time Notied:  10:36 AM 10/05/18   Provider Notified: Dr Chase Caller  Orders Received/Actions taken: orders in [facility]

## 2018-10-05 NOTE — H&P (Signed)
TRH H&P    Patient Demographics:    John Singleton, is a 58 y.o. male  MRN: [id number]  DOB - 1960-09-19  Admit Date - 10/16/2018  Referring MD/NP/PA:     Outpatient Primary MD for the patient is [name], MD [facility]  Patient coming from:  home  Chief complaint-  fever   HPI:    John Singleton  is a 58 y.o. male,  w hypertension, hyperlipidemia, dm2, cirrhosis, b12 deficiency, multiple myeloma in remission, ALL, apparently has had fever and altered mental status.  Pt just had office visit at [facility] recently w Wbc 0.  ED spoke with oncology at [facility] and apparently no ICU/ stepdown beds to accept the patient into. ED spoke with PCCM due to sepsis and  lactic acid of 7.3 along with pancytopenia, but per ED recommended stepdown .   In ED,  T101  P 144 R 31 Bp 108/85  Pox 100% on RA Wt 89.8kg  Na 134, K 4.5, Bun 45, Creatinine 1.24 Alb 2.9 Ast 173, Alt 50, Alk phos 130, T bili 4.5 Ammonia 38 Wbc 0.1  Hgb 6.2 (prior Hgb 5.8 on 10/03/2018),  Plt 5  INR 2.4  PH 7.405, pC02 23.7,   Urinalysis wbc 0-5, rbc 0-5 Blood culture x2 pending  Lactic acid 7.3-> 4.7  SBP 70 after 4L  Recommended that ER speak again to PCCM for ICU admission   Review of records   CBC and Differential (10/03/2018 7:56 AM EDT) CBC and Differential (10/03/2018 7:56 AM EDT)  Component Value Ref Range Performed At Pathologist Signature  WBC 0.0 (L)Comment: Chamber WBC 4.8 - 10.8 x 10*3/uL [place] [facility]   RBC 1.68 (L) 4.70 - 6.10 x 10*6/uL [place] [facility]   Hemoglobin 5.8 (LL)Comment: Called To And Read Back By Ubaldo Glassing at [phone] 10/03/2018 sks 14.0 - 18.0 G/DL [place] [facility]   Hematocrit 16.6 (LL)Comment: Called To And Read Back By Ubaldo Glassing at [phone] 10/03/2018 sks 42.0 - 52.0 % [place] [facility]   MCV 99.0  (H) 80.0 - 94.0 FL [place] [facility]   MCH 34.4 (H) 27.0 - 31.0 PG [place] [facility]   MCHC 34.7 33.0 - 37.0 G/DL [place] [facility]   RDW 23.5 (H) 11.5 - 14.5 % [place] [facility]   Platelets 3 (LL)Comment: Given To Nurse 160 - 360 X 10*3/uL [place] [facility]       Review of systems:    In addition to the HPI above,  Pt obtunded, unable to provide history   No Headache, No changes with Vision or hearing, No problems swallowing food or Liquids, No Chest pain, Cough or Shortness of Breath, No Abdominal pain, No Nausea or Vomiting, bowel movements are regular, No Blood in stool or Urine, No dysuria, No new skin rashes or bruises, No new joints pains-aches,  No new weakness, tingling,  numbness in any extremity, No recent weight gain or loss, No polyuria, polydypsia or polyphagia, No significant Mental Stressors.  All other systems reviewed and are negative.    Past History of the following :    Past Medical History:  Diagnosis Date  . B12 deficiency 10/23/2015  . Cancer ([place])   . Cirrhosis ([place])   . Diabetes mellitus without complication ([place])   . HTN (hypertension)   . Multiple myeloma ([place])   . Multiple myeloma in remission ([place]) 07/30/2015  . Pneumonia   . Sepsis(995.91)   . Stem cells transplant status [facility])       Past Surgical History:  Procedure Laterality Date  . LIMBAL STEM CELL TRANSPLANT    . NOSE SURGERY     "when I was a child"      Social History:      Social History   Tobacco Use  . Smoking status: Never Smoker  . Smokeless tobacco: Never Used  Substance Use Topics  . Alcohol use: No    Comment: occasional       Family History :     Family History  Problem Relation Age of Onset  . CAD Mother   . Stroke Father        Home Medications:   Prior to Admission medications   Medication Sig Start Date End Date Taking? Authorizing Provider   albuterol (PROVENTIL HFA;VENTOLIN HFA) 108 (90 Base) MCG/ACT inhaler Inhale into the lungs. 11/19/17   [provider]  Calcium Carbonate-Vitamin D3 600-400 MG-UNIT TABS Take 1,500 mg by mouth 2 (two) times daily.    [provider]  dasatinib (SPRYCEL) 20 MG tablet Take by mouth. 09/11/17   [provider]  insulin glargine (LANTUS) 100 UNIT/ML injection Inject 20 Units into the skin every evening.    [provider]  insulin lispro (HUMALOG KWIKPEN) 100 UNIT/ML KiwkPen Give per sliding scale    [provider]  magnesium oxide (MAG-OX) 400 MG tablet Take 400 mg by mouth 2 (two) times daily.     [provider]  metFORMIN (GLUCOPHAGE) 1000 MG tablet Take by mouth. 10/04/17   [provider]  metoprolol tartrate (LOPRESSOR) 25 MG tablet Take 12.5 mg by mouth 2 (two) times daily.    [provider]  Multiple Vitamin (MULTIVITAMIN WITH MINERALS) TABS tablet Take 1 tablet by mouth daily.    [provider]  simvastatin (ZOCOR) 20 MG tablet **HOLD** until follow up with your physician. Previous instructions: Take 1 tablet (20 mg total) by mouth daily. 10/04/17   [provider]  voriconazole (VFEND) 200 MG tablet Take one tablet (277m) by mouth twice daily 10/04/17   [provider]  voriconazole (VFEND) 50 MG tablet Take 2 tablets (1042m with one 20048mablet to equal 300m13mice daily 12/13/17   [provider]     Allergies:    No Known Allergies   Physical Exam:   Vitals  Blood pressure 91/68, pulse (!) 133, temperature (!) 100.8 F (38.2 C), resp. rate (!) 29, height _0  (1.702 m), weight 89.8 kg, SpO2 100 %.  1.  General: confused  2. Psychiatric: euthymic  3. Neurologic: Nonfocal, moves all 4 ext to painful stimuli  4. HEENMT:  Icteric, pupils 1.5mm 49mmetric, direct, consensual intact Neck: no jvd  5. Respiratory : dimished bs at bilateral lung bases, no wheezing  (anterior ausculation, patient not able to sit up)  6. Cardiovascular : Tachy s1, s2,   7. Gastrointestinal:  Abd: soft, obese, nt, +bs  8. Skin:  Ext: no c/c/e,  No rash  9.Musculoskeletal:  Good ROM    Data Review:    CBC Recent Labs  Lab 10/06/2018 2220  WBC 0.1*  HGB 6.2*  HCT 18.0*  PLT 5*  MCV 97.8  MCH 33.7  MCHC 34.4  RDW 18.7*  LYMPHSABS 0.0*  MONOABS 0.0*  EOSABS 0.0  BASOSABS 0.0   ------------------------------------------------------------------------------------------------------------------  Results for orders placed or performed during the hospital encounter of 09/23/2018 (from the past 48 hour(s))  Comprehensive metabolic panel     Status: Abnormal   Collection Time: 09/30/2018 10:20 PM  Result Value Ref Range   Sodium 134 (L) 135 - 145 mmol/L   Potassium 4.5 3.5 - 5.1 mmol/L   Chloride 103 98 - 111 mmol/L   CO2 14 (L) 22 - 32 mmol/L   Glucose, Bld 85 70 - 99 mg/dL   BUN 45 (H) 6 - 20 mg/dL   Creatinine, Ser 1.24 0.61 - 1.24 mg/dL   Calcium 8.3 (L) 8.9 - 10.3 mg/dL   Total Protein 5.1 (L) 6.5 - 8.1 g/dL   Albumin 2.9 (L) 3.5 - 5.0 g/dL   AST 173 (H) 15 - 41 U/L   ALT 50 (H) 0 - 44 U/L   Alkaline Phosphatase 130 (H) 38 - 126 U/L   Total Bilirubin 4.5 (H) 0.3 - 1.2 mg/dL   GFR calc non Af Amer >60 >60 mL/min   GFR calc Af Amer >60 >60 mL/min   Anion gap 17 (H) 5 - 15    Comment: Performed at [facility], [address] [place], [place] [address]  CBC WITH DIFFERENTIAL     Status: Abnormal   Collection Time: 10/21/2018 10:20 PM  Result Value Ref Range   WBC 0.1 (LL) 4.0 - 10.5 K/uL    Comment: This critical result has verified and been called to [facility] by Duncan Dull on 09 12 2020 at 2247, and has been read back.    RBC 1.84 (L) 4.22 - 5.81 MIL/uL   Hemoglobin 6.2 (LL) 13.0 - 17.0 g/dL    Comment: This critical result has verified and been called to [facility] by Duncan Dull on 09 12 2020 at 2247, and has been read back.    HCT 18.0 (L) 39.0  - 52.0 %   MCV 97.8 80.0 - 100.0 fL   MCH 33.7 26.0 - 34.0 pg   MCHC 34.4 30.0 - 36.0 g/dL   RDW 18.7 (H) 11.5 - 15.5 %   Platelets 5 (LL) 150 - 400 K/uL    Comment: SPECIMEN CHECKED FOR CLOTS THIS CRITICAL RESULT HAS VERIFIED AND BEEN CALLED TO [place],T BY Carleton ON 09 12 2020 AT 9201, AND HAS BEEN READ BACK.     nRBC 0.0 0.0 - 0.2 %   Neutrophils Relative % 49 %   Neutro Abs 0.0 (L) 1.7 - 7.7 K/uL   Lymphocytes Relative 13 %   Lymphs Abs 0.0 (L) 0.7 - 4.0 K/uL   Monocytes Relative 25 %   Monocytes Absolute 0.0 (L) 0.1 - 1.0 K/uL   Eosinophils Relative 0 %   Eosinophils Absolute 0.0 0.0 - 0.5 K/uL   Basophils Relative 0 %   Basophils Absolute 0.0 0.0 - 0.1 K/uL   Immature Granulocytes 13 %   Abs Immature Granulocytes 0.01 0.00 - 0.07 K/uL    Comment: Performed at [facility], [address], [place], [place] [address]  APTT  Status: Abnormal   Collection Time: 10/22/2018 10:20 PM  Result Value Ref Range   aPTT 37 (H) 24 - 36 seconds    Comment:        IF BASELINE aPTT IS ELEVATED, SUGGEST PATIENT RISK ASSESSMENT BE USED TO DETERMINE APPROPRIATE ANTICOAGULANT THERAPY. Performed at [facility], [address], [place], [place] [address]   Protime-INR     Status: Abnormal   Collection Time: 10/05/2018 10:20 PM  Result Value Ref Range   Prothrombin Time 25.8 (H) 11.4 - 15.2 seconds   INR 2.4 (H) 0.8 - 1.2    Comment: (NOTE) INR goal varies based on device and disease states. Performed at [facility], [address] [place], [place] [address]   Ammonia     Status: Abnormal   Collection Time: 09/30/2018 10:20 PM  Result Value Ref Range   Ammonia 38 (H) 9 - 35 umol/L    Comment: Performed at [facility], [address] [place], [place] [address]  Lactic acid, plasma     Status: Abnormal   Collection Time: 10/08/2018 10:20 PM  Result Value Ref Range   Lactic Acid, Venous 7.3 (HH) 0.5 - 1.9 mmol/L    Comment: CRITICAL RESULT CALLED TO, READ BACK BY AND VERIFIED WITH:  [place],K 2304 ON 09/28/2018 BY JUW Performed at [facility], [address], [place], [place] [address]   Urinalysis, Routine w reflex microscopic     Status: Abnormal   Collection Time: 10/03/2018 10:22 PM  Result Value Ref Range   Color, Urine AMBER (A) YELLOW    Comment: BIOCHEMICALS MAY BE AFFECTED BY COLOR   APPearance HAZY (A) CLEAR   Specific Gravity, Urine 1.017 1.005 - 1.030   pH 5.0 5.0 - 8.0   Glucose, UA NEGATIVE NEGATIVE mg/dL   Hgb urine dipstick LARGE (A) NEGATIVE   Bilirubin Urine NEGATIVE NEGATIVE   Ketones, ur 5 (A) NEGATIVE mg/dL   Protein, ur 30 (A) NEGATIVE mg/dL   Nitrite NEGATIVE NEGATIVE   Leukocytes,Ua NEGATIVE NEGATIVE   RBC / HPF 0-5 0 - 5 RBC/hpf   WBC, UA 0-5 0 - 5 WBC/hpf   Bacteria, UA NONE SEEN NONE SEEN   Squamous Epithelial / LPF 0-5 0 - 5   Mucus PRESENT     Comment: Performed at [facility], [address], [place], [place] [address]  Rapid urine drug screen (hospital performed)     Status: None   Collection Time: 10/08/2018 10:22 PM  Result Value Ref Range   Opiates NONE DETECTED NONE DETECTED   Cocaine NONE DETECTED NONE DETECTED   Benzodiazepines NONE DETECTED NONE DETECTED   Amphetamines NONE DETECTED NONE DETECTED   Tetrahydrocannabinol NONE DETECTED NONE DETECTED   Barbiturates NONE DETECTED NONE DETECTED    Comment: (NOTE) DRUG SCREEN FOR MEDICAL PURPOSES ONLY.  IF CONFIRMATION IS NEEDED FOR ANY PURPOSE, NOTIFY LAB WITHIN 5 DAYS. LOWEST DETECTABLE LIMITS FOR URINE DRUG SCREEN Drug Class                     Cutoff (ng/mL) Amphetamine and metabolites    1000 Barbiturate and metabolites    200 Benzodiazepine                 254 Tricyclics and metabolites     300 Opiates and metabolites        300 Cocaine and metabolites        300 THC  78 Performed at [facility], [address], [place], [place] [address]   SARS Coronavirus 2 [facility] order, Performed in [facility] hospital lab) Nasopharyngeal  Nasopharyngeal Swab     Status: None   Collection Time: 10/03/2018 10:43 PM   Specimen: Nasopharyngeal Swab  Result Value Ref Range   SARS Coronavirus 2 NEGATIVE NEGATIVE    Comment: (NOTE) If result is NEGATIVE SARS-CoV-2 target nucleic acids are NOT DETECTED. The SARS-CoV-2 RNA is generally detectable in upper and lower  respiratory specimens during the acute phase of infection. The lowest  concentration of SARS-CoV-2 viral copies this assay can detect is 250  copies / mL. A negative result does not preclude SARS-CoV-2 infection  and should not be used as the sole basis for treatment or other  patient management decisions.  A negative result may occur with  improper specimen collection / handling, submission of specimen other  than nasopharyngeal swab, presence of viral mutation(s) within the  areas targeted by this assay, and inadequate number of viral copies  (<250 copies / mL). A negative result must be combined with clinical  observations, patient history, and epidemiological information. If result is POSITIVE SARS-CoV-2 target nucleic acids are DETECTED. The SARS-CoV-2 RNA is generally detectable in upper and lower  respiratory specimens dur ing the acute phase of infection.  Positive  results are indicative of active infection with SARS-CoV-2.  Clinical  correlation with patient history and other diagnostic information is  necessary to determine patient infection status.  Positive results do  not rule out bacterial infection or co-infection with other viruses. If result is PRESUMPTIVE POSTIVE SARS-CoV-2 nucleic acids MAY BE PRESENT.   A presumptive positive result was obtained on the submitted specimen  and confirmed on repeat testing.  While 2019 novel coronavirus  (SARS-CoV-2) nucleic acids may be present in the submitted sample  additional confirmatory testing may be necessary for epidemiological  and / or clinical management purposes  to differentiate between  SARS-CoV-2  and other Sarbecovirus currently known to infect humans.  If clinically indicated additional testing with an alternate test  methodology [phone]) is advised. The SARS-CoV-2 RNA is generally  detectable in upper and lower respiratory sp ecimens during the acute  phase of infection. The expected result is Negative. Fact Sheet for Patients:  StrictlyIdeas.no Fact Sheet for Healthcare Providers: BankingDealers.co.za This test is not yet approved or cleared by the [place] FDA and has been authorized for detection and/or diagnosis of SARS-CoV-2 by FDA under an Emergency Use Authorization (EUA).  This EUA will remain in effect (meaning this test can be used) for the duration of the COVID-19 declaration under Section 564(b)(1) of the Act, 21 U.S.C. section 360bbb-3(b)(1), unless the authorization is terminated or revoked sooner. Performed at [facility], [address] [place], [place] [address]   Blood Culture (routine x 2)     Status: None (Preliminary result)   Collection Time: 09/25/2018 10:56 PM   Specimen: Left Antecubital; Blood  Result Value Ref Range   Specimen Description LEFT ANTECUBITAL    Special Requests      BOTTLES DRAWN AEROBIC AND ANAEROBIC Blood Culture results may not be optimal due to an excessive volume of blood received in culture bottles Performed at [facility], [address] [place], [place] [address]    Culture PENDING    Report Status PENDING   Lactic acid, plasma     Status: Abnormal   Collection Time: 10/05/18 12:55 AM  Result Value Ref Range  Lactic Acid, Venous 4.7 (HH) 0.5 - 1.9 mmol/L    Comment: CRITICAL RESULT CALLED TO, READ BACK BY AND VERIFIED WITH: Busch,T @ 0149 ON 10/05/18 BY JUW Performed at [facility], [address], [place], [place] [address]   Blood gas, arterial (at [facility])     Status: Abnormal (Preliminary result)   Collection Time: 10/05/18  1:07 AM  Result Value Ref Range    FIO2 PENDING    pH, Arterial 7.405 7.350 - 7.450   pCO2 arterial 23.7 (L) 32.0 - 48.0 mmHg   pO2, Arterial 106 83.0 - 108.0 mmHg   Bicarbonate 17.5 (L) 20.0 - 28.0 mmol/L   Acid-base deficit 9.3 (H) 0.0 - 2.0 mmol/L   O2 Saturation 97.6 %   Patient temperature 37.0    Allens test (pass/fail) PASS PASS    Comment: Performed at [facility], [address], [place], [place] [address]    Chemistries  Recent Labs  Lab 10/19/2018 2220  NA 134*  K 4.5  CL 103  CO2 14*  GLUCOSE 85  BUN 45*  CREATININE 1.24  CALCIUM 8.3*  AST 173*  ALT 50*  ALKPHOS 130*  BILITOT 4.5*   ------------------------------------------------------------------------------------------------------------------  ------------------------------------------------------------------------------------------------------------------ GFR: Estimated Creatinine Clearance: 70.3 mL/min (by C-G formula based on SCr of 1.24 mg/dL). Liver Function Tests: Recent Labs  Lab 09/25/2018 2220  AST 173*  ALT 50*  ALKPHOS 130*  BILITOT 4.5*  PROT 5.1*  ALBUMIN 2.9*   No results for input(s): LIPASE, AMYLASE in the last 168 hours. Recent Labs  Lab 10/21/2018 2220  AMMONIA 38*   Coagulation Profile: Recent Labs  Lab 10/01/2018 2220  INR 2.4*   Cardiac Enzymes: No results for input(s): CKTOTAL, CKMB, CKMBINDEX, TROPONINI in the last 168 hours. BNP (last 3 results) No results for input(s): PROBNP in the last 8760 hours. HbA1C: No results for input(s): HGBA1C in the last 72 hours. CBG: No results for input(s): GLUCAP in the last 168 hours. Lipid Profile: No results for input(s): CHOL, HDL, LDLCALC, TRIG, CHOLHDL, LDLDIRECT in the last 72 hours. Thyroid Function Tests: No results for input(s): TSH, T4TOTAL, FREET4, T3FREE, THYROIDAB in the last 72 hours. Anemia Panel: No results for input(s): VITAMINB12, FOLATE, FERRITIN, TIBC, IRON, RETICCTPCT in the last 72 hours.   --------------------------------------------------------------------------------------------------------------- Urine analysis:    Component Value Date/Time   COLORURINE AMBER (A) 09/24/2018 2222   APPEARANCEUR HAZY (A) 10/02/2018 2222   LABSPEC 1.017 10/22/2018 2222   PHURINE 5.0 09/26/2018 2222   GLUCOSEU NEGATIVE 09/26/2018 2222   HGBUR LARGE (A) 10/22/2018 2222   BILIRUBINUR NEGATIVE 10/17/2018 2222   KETONESUR 5 (A) 10/15/2018 2222   PROTEINUR 30 (A) 09/25/2018 2222   UROBILINOGEN 0.2 05/17/2011 0952   NITRITE NEGATIVE 09/30/2018 2222   LEUKOCYTESUR NEGATIVE 10/13/2018 2222      Imaging Results:    Ct Head Wo Contrast  Result Date: 10/05/2018 CLINICAL DATA:  Altered mental status, sepsis EXAM: CT HEAD WITHOUT CONTRAST TECHNIQUE: Contiguous axial images were obtained from the base of the skull through the vertex without intravenous contrast. COMPARISON:  None. FINDINGS: Brain: There is no mass, hemorrhage or extra-axial collection. The size and configuration of the ventricles and extra-axial CSF spaces are normal. The brain parenchyma is normal, without acute or chronic infarction. Vascular: No abnormal hyperdensity of the major intracranial arteries or dural venous sinuses. No intracranial atherosclerosis. Skull: The visualized skull base, calvarium and extracranial soft tissues are normal. Sinuses/Orbits: No fluid levels or advanced mucosal thickening of the  visualized paranasal sinuses. No mastoid or middle ear effusion. The orbits are normal. IMPRESSION: Normal head CT. Electronically Signed   By: Ulyses Jarred M.D.   On: 10/05/2018 00:56   Dg Chest Port 1 View  Result Date: 09/28/2018 CLINICAL DATA:  Altered mental status. EXAM: PORTABLE CHEST 1 VIEW COMPARISON:  Radiograph 06/12/2017 FINDINGS: Accessed right chest port with tip in the lower SVC. Heart is normal in size. Unchanged mediastinal contours. Mild central vascular congestion. Questionable peripheral patchy opacity in  the left mid lung. No pleural effusion or pneumothorax. No acute osseous abnormalities are seen. Remote bilateral rib fractures. IMPRESSION: 1. Questionable peripheral patchy opacity in the left mid lung. 2. Mild vascular congestion. Electronically Signed   By: Keith Rake M.D.   On: 10/10/2018 22:52       Assessment & Plan:    Principal Problem:   Febrile neutropenia ([place]) Active Problems:   HTN (hypertension)   Multiple myeloma in remission (HCC)   DM2 (diabetes mellitus, type 2) (HCC)   Thrombocytopenia (HCC)   B12 deficiency   B-cell acute lymphoblastic leukemia (ALL) (HCC)   Pancytopenia (HCC)   Protein-calorie malnutrition, severe (HCC)   Sepsis (HCC)   Abnormal liver function  Febrile Neutropenia Sepsis Blood culture x2 vanco iv, cefepime iv Please consult oncology   Sepsis (fever, tachycardia, hypotension, elevated lactic acid) Blood culture x2 Cont abx as above  Hypotension After 4L, NS, patient placed on pressors in ED,  Due to hypotension recommended that the patient be admitted to ICU Spoke with Dr. Deasia Chiu Ivanoff, she will accept the patient to ICU under Dr. Nickola Major as attending. Does not pull up, so have made the attending Dr. Sulay Brymer Ivanoff,  [facility] notified so that they can change this. Appreciate input  Pancytopenia B cell Acute ALL Multiple Myeloma in remission Please consult oncology regarding pancytopenia  Abnormal liver function Recommend check cpk Recommend check acute hepatitis panel Recommend check RUQ ultrasound Recommend cmp in am    DVT Prophylaxis-   Recommend   SCDs   AM Labs Ordered, also please review Full Orders  Family Communication: Admission, patients condition and plan of care including tests being ordered have been discussed with the patient who indicate understanding and agree with the plan and Code Status.  Code Status:  DNR per brother, "Grayland Ormond"  states that he thinks that his brother would not want resuscitation. That is what the  patient has expressed recently to him. "Grayland Ormond" states he has had to make medical decisions for his brothers care at [facility].   Admission status:  Inpatient: Based on patients clinical presentation and evaluation of above clinical data, I have made determination that patient meets Inpatient criteria at this time. Septic shock,  Pt requiring iv pressors after 4L ns iv, pt will require iv abx.  Pt critically ill.   Time spent in minutes : 45 minutes critical care   Jani Gravel M.D on 10/05/2018 at 3:14 AM

## 2018-10-05 NOTE — Progress Notes (Addendum)
Received pt from [facility] via [place]. Pt difficult to arouse to noxious stimuli, rr shallow with periods of brief apnea. Portacath right upper chest infusing neosynephrine @ 89mcg, sbp 89. RT and elink notified of pt arrival, nurse jody performed cameral visual of pt and notified elink md. elink md will have ground team md to come to bedside.

## 2018-10-06 DIAGNOSIS — D61818 Other pancytopenia: Secondary | ICD-10-CM

## 2018-10-06 LAB — HEMOGLOBIN A1C
Hgb A1c MFr Bld: 6.3 % — ABNORMAL HIGH (ref 4.8–5.6)
Mean Plasma Glucose: 134 mg/dL

## 2018-10-06 LAB — BLOOD CULTURE ID PANEL (REFLEXED)

## 2018-10-06 LAB — CBC
HCT: 24.4 % — ABNORMAL LOW (ref 39.0–52.0)
HCT: 24 % — ABNORMAL LOW (ref 39.0–52.0)
Hemoglobin: 9 g/dL — ABNORMAL LOW (ref 13.0–17.0)
Hemoglobin: 8.9 g/dL — ABNORMAL LOW (ref 13.0–17.0)
MCH: 33.2 pg (ref 26.0–34.0)
MCH: 33.5 pg (ref 26.0–34.0)
MCHC: 36.9 g/dL — ABNORMAL HIGH (ref 30.0–36.0)
MCHC: 37.1 g/dL — ABNORMAL HIGH (ref 30.0–36.0)
MCV: 90 fL (ref 80.0–100.0)
MCV: 90.2 fL (ref 80.0–100.0)
Platelets: 17 10*3/uL — CL (ref 150–400)
Platelets: 17 10*3/uL — CL (ref 150–400)
RBC: 2.71 MIL/uL — ABNORMAL LOW (ref 4.22–5.81)
RBC: 2.66 MIL/uL — ABNORMAL LOW (ref 4.22–5.81)
RDW: 16.7 % — ABNORMAL HIGH (ref 11.5–15.5)
RDW: 17.1 % — ABNORMAL HIGH (ref 11.5–15.5)
WBC: 0.4 10*3/uL — CL (ref 4.0–10.5)
WBC: 0.5 10*3/uL — CL (ref 4.0–10.5)
nRBC: 0 % (ref 0.0–0.2)
nRBC: 0 % (ref 0.0–0.2)

## 2018-10-06 LAB — CBC WITH DIFFERENTIAL/PLATELET
Abs Immature Granulocytes: 0.01 10*3/uL (ref 0.00–0.07)
Basophils Absolute: 0 10*3/uL (ref 0.0–0.1)
Basophils Relative: 0 %
Eosinophils Absolute: 0 10*3/uL (ref 0.0–0.5)
Eosinophils Relative: 0 %
HCT: 18.5 % — ABNORMAL LOW (ref 39.0–52.0)
Hemoglobin: 6.3 g/dL — CL (ref 13.0–17.0)
Immature Granulocytes: 3 %
Lymphocytes Relative: 3 %
Lymphs Abs: 0 10*3/uL — ABNORMAL LOW (ref 0.7–4.0)
MCH: 32.6 pg (ref 26.0–34.0)
MCHC: 34.1 g/dL (ref 30.0–36.0)
MCV: 95.9 fL (ref 80.0–100.0)
Monocytes Absolute: 0.1 10*3/uL (ref 0.1–1.0)
Monocytes Relative: 15 %
Neutro Abs: 0.3 10*3/uL — ABNORMAL LOW (ref 1.7–7.7)
Neutrophils Relative %: 79 %
Platelets: 9 10*3/uL — CL (ref 150–400)
RBC: 1.93 MIL/uL — ABNORMAL LOW (ref 4.22–5.81)
RDW: 17.6 % — ABNORMAL HIGH (ref 11.5–15.5)
WBC: 0.3 10*3/uL — CL (ref 4.0–10.5)
nRBC: 0 % (ref 0.0–0.2)

## 2018-10-06 LAB — COMPREHENSIVE METABOLIC PANEL
ALT: 55 U/L — ABNORMAL HIGH (ref 0–44)
AST: 99 U/L — ABNORMAL HIGH (ref 15–41)
Albumin: 2.2 g/dL — ABNORMAL LOW (ref 3.5–5.0)
Alkaline Phosphatase: 84 U/L (ref 38–126)
Anion gap: 9 (ref 5–15)
BUN: 34 mg/dL — ABNORMAL HIGH (ref 6–20)
CO2: 18 mmol/L — ABNORMAL LOW (ref 22–32)
Calcium: 7.4 mg/dL — ABNORMAL LOW (ref 8.9–10.3)
Chloride: 115 mmol/L — ABNORMAL HIGH (ref 98–111)
Creatinine, Ser: 0.84 mg/dL (ref 0.61–1.24)
GFR calc Af Amer: >60 mL/min (ref >60)
GFR calc non Af Amer: >60 mL/min (ref >60)
Glucose, Bld: 185 mg/dL — ABNORMAL HIGH (ref 70–99)
Potassium: 3.5 mmol/L (ref 3.5–5.1)
Sodium: 142 mmol/L (ref 135–145)
Total Bilirubin: 7.4 mg/dL — ABNORMAL HIGH (ref 0.3–1.2)
Total Protein: 4.3 g/dL — ABNORMAL LOW (ref 6.5–8.1)

## 2018-10-06 LAB — GLUCOSE, CAPILLARY
Glucose-Capillary: 123 mg/dL — ABNORMAL HIGH (ref 70–99)
Glucose-Capillary: 171 mg/dL — ABNORMAL HIGH (ref 70–99)
Glucose-Capillary: 186 mg/dL — ABNORMAL HIGH (ref 70–99)
Glucose-Capillary: 184 mg/dL — ABNORMAL HIGH (ref 70–99)
Glucose-Capillary: 153 mg/dL — ABNORMAL HIGH (ref 70–99)

## 2018-10-06 LAB — ABO/RH: ABO/RH(D): O POS

## 2018-10-06 LAB — BPAM PLATELET PHERESIS
Blood Product Expiration Date: 202009142359
Blood Product Expiration Date: 202009152359
Blood Product Expiration Date: 202009152359
ISSUE DATE / TIME: 202009131620
ISSUE DATE / TIME: 202009131706
ISSUE DATE / TIME: 202009140114
Unit Type and Rh: 5100
Unit Type and Rh: 600
Unit Type and Rh: 7300

## 2018-10-06 LAB — PREPARE PLATELET PHERESIS
Unit division: 0
Unit division: 0
Unit division: 0

## 2018-10-06 LAB — MAGNESIUM
Magnesium: 2.2 mg/dL (ref 1.7–2.4)
Magnesium: 2.3 mg/dL (ref 1.7–2.4)

## 2018-10-06 LAB — PHOSPHORUS
Phosphorus: 2.8 mg/dL (ref 2.5–4.6)
Phosphorus: 2.4 mg/dL — ABNORMAL LOW (ref 2.5–4.6)

## 2018-10-06 LAB — PREPARE RBC (CROSSMATCH)

## 2018-10-06 LAB — LACTIC ACID, PLASMA
Lactic Acid, Venous: 1.5 mmol/L (ref 0.5–1.9)
Lactic Acid, Venous: 1.5 mmol/L (ref 0.5–1.9)

## 2018-10-06 MED ORDER — DEXTROSE 5 % IV SOLN
400.0000 mg | Freq: Two times a day (BID) | INTRAVENOUS | Status: DC
  Administered 2018-10-06 (×2): 400 mg via INTRAVENOUS
  Filled 2018-10-06 (×4): qty 8

## 2018-10-06 MED ORDER — HALOPERIDOL LACTATE 5 MG/ML IJ SOLN
1.0000 mg | INTRAMUSCULAR | Status: DC | PRN
  Administered 2018-10-06: 1 mg via INTRAVENOUS
  Filled 2018-10-06: qty 1

## 2018-10-06 MED ORDER — DEXMEDETOMIDINE HCL IN NACL 400 MCG/100ML IV SOLN
0.4000 ug/kg/h | INTRAVENOUS | Status: DC
  Administered 2018-10-06: 0.5 ug/kg/h via INTRAVENOUS
  Filled 2018-10-06: qty 100

## 2018-10-06 MED ORDER — LORAZEPAM 2 MG/ML IJ SOLN
0.5000 mg | INTRAMUSCULAR | Status: DC | PRN
  Administered 2018-10-06 – 2018-10-08 (×8): 0.5 mg via INTRAVENOUS
  Filled 2018-10-06 (×8): qty 1

## 2018-10-06 MED ORDER — TBO-FILGRASTIM 300 MCG/0.5ML ~~LOC~~ SOSY
300.0000 ug | PREFILLED_SYRINGE | Freq: Every day | SUBCUTANEOUS | Status: DC
  Administered 2018-10-06 – 2018-10-07 (×2): 300 ug via SUBCUTANEOUS
  Filled 2018-10-06 (×3): qty 0.5

## 2018-10-06 MED ORDER — PRO-STAT SUGAR FREE PO LIQD
30.0000 mL | Freq: Two times a day (BID) | ORAL | Status: DC
  Administered 2018-10-06 – 2018-10-08 (×5): 30 mL
  Filled 2018-10-06 (×4): qty 30

## 2018-10-06 MED ORDER — VITAL 1.5 CAL PO LIQD
1000.0000 mL | ORAL | Status: DC
  Administered 2018-10-06 – 2018-10-08 (×2): 1000 mL
  Filled 2018-10-06 (×4): qty 1000

## 2018-10-06 MED ORDER — LACTULOSE 10 GM/15ML PO SOLN
30.0000 g | Freq: Two times a day (BID) | ORAL | Status: DC
  Administered 2018-10-06 – 2018-10-07 (×2): 30 g
  Filled 2018-10-06 (×2): qty 45

## 2018-10-06 MED ORDER — VITAMIN K1 10 MG/ML IJ SOLN
5.0000 mg | Freq: Once | INTRAVENOUS | Status: AC
  Administered 2018-10-06: 5 mg via INTRAVENOUS
  Filled 2018-10-06: qty 0.5

## 2018-10-06 MED ORDER — SODIUM CHLORIDE 0.9% IV SOLUTION
Freq: Once | INTRAVENOUS | Status: AC
  Administered 2018-10-06: 10:00:00 via INTRAVENOUS

## 2018-10-06 NOTE — Procedures (Signed)
Cortrak  Tube Type:  Cortrak - 43 inches Tube Location:  Left nare Initial Placement:  Stomach Secured by: Bridle Technique Used to Measure Tube Placement:  Documented cm marking at nare/ corner of mouth Cortrak Secured At:  65 cm    Cortrak Tube Team Note:  Consult received to place a Cortrak feeding tube.   No x-ray is required. RN may begin using tube.   If the tube becomes dislodged please keep the tube and contact the Cortrak team at [email or web address] (password [email or web address]) for replacement.  If after hours and replacement cannot be delayed, place a NG tube and confirm placement with an abdominal x-ray.    John Detore MS, RD, LDN Pager #- [phone] Office#- [phone] After Hours Pager: [phone]   

## 2018-10-06 NOTE — Progress Notes (Signed)
NAME:  John Singleton, MRN:  [id number], DOB:  [birth year], LOS: 1 ADMISSION DATE:  10/07/2018, CONSULTATION DATE:  10/05/2018 REFERRING MD:  Dr Maudie Mercury, CHIEF COMPLAINT:  FEVER   Brief History   58 yr old M w/ PMHx sig for B-cell ALL, multiple myeloma, HLD, HTN, DM, Cirrhosis presenting with Febrile Neutropenia. LA 7.3>> 4.7 PCCM asked to evaluate due to hypotension post 4L. Transferred from [facility] and required pressors.   Past Medical History  .Marland Kitchen Active Ambulatory Problems    Diagnosis Date Noted  . HTN (hypertension)   . Multiple myeloma in remission ([place]) 07/30/2015  . DM2 (diabetes mellitus, type 2) ([place]) 07/30/2015  . Thrombocytopenia ([place]) 07/30/2015  . Leucopenia 07/30/2015  . Anemia 07/30/2015  . B12 deficiency 10/23/2015  . Allergic transfusion reaction 08/18/2012  . H/O stem cell transplant ([place]) 08/07/2012  . B-cell acute lymphoblastic leukemia (ALL) ([place]) 06/13/2017  . Pancytopenia ([place]) 06/13/2017  . Postural hypotension 08/04/2017   Resolved Ambulatory Problems    Diagnosis Date Noted  . No Resolved Ambulatory Problems   Past Medical History:  Diagnosis Date  . Cancer ([place])   . Cirrhosis ([place])   . Diabetes mellitus without complication ([place])   . Multiple myeloma ([place])   . Pneumonia   . Sepsis(995.91)   . Stem cells transplant status ([place])     Consults:  9/13>>>PCCM Heme/Onc  Procedures:  none  Significant Diagnostic Tests:  CTH w/o contrast FINDINGS: Brain: There is no mass, hemorrhage or extra-axial collection. The size and configuration of the ventricles and extra-axial CSF spaces are normal. The brain parenchyma is normal, without acute or chronic infarction. Vascular: No abnormal hyperdensity of the major intracranial arteries or dural venous sinuses. No intracranial atherosclerosis. Skull: The visualized skull base, calvarium and extracranial soft tissues are normal. Sinuses/Orbits: No fluid levels or advanced mucosal thickening of the visualized  paranasal sinuses. No mastoid or middle ear effusion. The orbits are normal.  CXR 9/12 Accessed right chest port with tip in the lower SVC. Heart is normal in size. Unchanged mediastinal contours. Mild central vascular congestion. Questionable peripheral patchy opacity in the left mid lung. No pleural effusion or pneumothorax. No acute osseous abnormalities are seen. Remote bilateral rib fracture: 1. Questionable peripheral patchy opacity in the left mid lung. 2. Mild vascular congestion.  Micro Data:  9/12>>>SARSCOV2 negative 9/12>>>MRSA PCR negative  Antimicrobials:  9/12>>Flagyl Cefepime 9/12 > Vancomycin 9/12 >  SUBJECTIVE: Off pressors overnight. Somewhat more arousable this morning.   Objective   Blood pressure 126/80, pulse (!) 116, temperature 98.3 F (36.8 C), temperature source Oral, resp. rate (!) 21, height _0  (1.702 m), weight 89.8 kg, SpO2 99 %.        Intake/Output Summary (Last 24 hours) at 10/06/2018 0740 Last data filed at 10/06/2018 7654 Gross per 24 hour  Intake 3938.19 ml  Output 3075 ml  Net 863.19 ml   Filed Weights   10/13/2018 2226  Weight: 89.8 kg   Examination: General:  Middle aged male in NAD Neuro:  Lethargic, arouses to agitated state with verbal stimuli. No meanginful interaction.  HEENT:  [place]/AT, No JVD noted, PERRL Cardiovascular:  Tachy, regular, no MRG Lungs:  Clear bilateral breath sounds Abdomen:  Soft, non-distended, non-tender Musculoskeletal:  No acute deformity, moves all four extremities.  Skin:  Intact, MMM, no jaundice.    Assessment & Plan:  Acute Encephalopathy: Metabolic vs  Ammonia mildly elevated at 37. Plan: - F/u smear - Continue lactulose - Aspiration precautions  Febrile Neutropenia: Bacteremia: Blood cultures now positive for GPC, BCID with strep.  Plan: -Contact and Droplet precautions - Continue on Cefepime IV - F/u Blood cx  - If plan is for continued aggressive care, likely need to remove  port-a-cath. He is now off pressors.   Recurrent B-cell ALL Pancytopenia- likely due to recent chemotherapy 9/8 - Oncology/Heme following - Keep Hgb > 7 - Keep plt > 10k - Consider transfer to [facility] if bed becomes available and he is stable for transfer. - Trending daily G-CSF until ANC > 1.5   Septic Shock  Received 30cc/kg at [facility]. Now off pressors.  - MAP goal > 24mHg - F/u cultures  - Trend WBC and fever curve  Liver Cirrhosis: INR 3.0, Albumin 2.9 - follow coags - lactulose as above  Acute Kidney Injury: Most likely secondary to hypoperfusion vs Hepatorenal syndrome - Monitor UOP - Trend BMP - Avoid nephrotoxic meds - hemodynamic support  Anion Gap Metabolic Acidosis: Lactic Acid, Uremia - awaiting AM labs, suspect this has largely resolved.   Best practice:  Diet: NPO Pain/Anxiety/Delirium protocol (if indicated): not indicated  VAP protocol (if indicated): not intubated DVT prophylaxis: no prophylaxis in lieu of thrombocytopenia GI prophylaxis: no PPI due to significant thrombocytopenia Glucose control: has a h/o DM if BG >1843mdl will start ISS Mobility: bedrest Code Status: BiPAP, meds only. No CPR or intubation.  Family Communication: Discussed in detail Goals of care with Brother KeGrayland Ormonde was very clear regarding what patient wanted in this instance>> No CPR no intubation ok with vasopressors and Rx of reversible issues.  Disposition: ICU   Labs   CBC: Recent Labs  Lab 10/16/2018 2220 10/05/18 0915 10/05/18 2030  WBC 0.1* 0.2*  --   NEUTROABS 0.0* 0.2*  --   HGB 6.2* 5.5*  --   HCT 18.0* 15.4*  --   MCV 97.8 94.5  --   PLT 5* <5* 18*    Basic Metabolic Panel: Recent Labs  Lab 09/25/2018 2220 10/05/18 0915  NA 134* 136  K 4.5 4.0  CL 103 111  CO2 14* 17*  GLUCOSE 85 150*  BUN 45* 38*  CREATININE 1.24 1.10  CALCIUM 8.3* 7.6*   GFR: Estimated Creatinine Clearance: 79.2 mL/min (by C-G formula based on SCr of 1.1 mg/dL). Recent Labs  Lab  10/10/2018 2220 10/05/18 0055 10/05/18 0915  WBC 0.1*  --  0.2*  LATICACIDVEN 7.3* 4.7*  --     Liver Function Tests: Recent Labs  Lab 09/29/2018 2220 10/05/18 0915  AST 173* 120*  ALT 50* 47*  ALKPHOS 130* 105  BILITOT 4.5* 5.5*  PROT 5.1* 4.3*  ALBUMIN 2.9* 2.2*   No results for input(s): LIPASE, AMYLASE in the last 168 hours. Recent Labs  Lab 10/02/2018 2220 10/05/18 0915  AMMONIA 38* 37*    ABG    Component Value Date/Time   PHART 7.405 10/05/2018 0107   PCO2ART 23.7 (L) 10/05/2018 0107   PO2ART 106 10/05/2018 0107   HCO3 17.5 (L) 10/05/2018 0107   ACIDBASEDEF 9.3 (H) 10/05/2018 0107   O2SAT 97.6 10/05/2018 0107     Coagulation Profile: Recent Labs  Lab 09/24/2018 2220 10/05/18 1136  INR 2.4* 3.0*    Cardiac Enzymes: No results for input(s): CKTOTAL, CKMB, CKMBINDEX, TROPONINI in the last 168 hours.  HbA1C: No results found for: HGBA1C  CBG: Recent Labs  Lab 10/05/18 1141 10/05/18 1557 10/05/18 1952 10/05/18 2329 10/06/18 0344  GLUCAP 139* 142* 152* 150* 171*  Review of Systems:     Past Medical History  He,  has a past medical history of B12 deficiency (10/23/2015), Cancer ([place]), Cirrhosis ([place]), Diabetes mellitus without complication ([place]), HTN (hypertension), Multiple myeloma ([place]), Multiple myeloma in remission ([place]) (07/30/2015), Pneumonia, Sepsis(995.91), and Stem cells transplant status ([place]).   Surgical History    Past Surgical History:  Procedure Laterality Date  . LIMBAL STEM CELL TRANSPLANT    . NOSE SURGERY     "when I was a child"     Social History   reports that he has never smoked. He has never used smokeless tobacco. He reports that he does not drink alcohol or use drugs.   Family History   His family history includes CAD in his mother; Stroke in his father.   Allergies No Known Allergies   Home Medications  Prior to Admission medications   Medication Sig Start Date End Date Taking? Authorizing Provider  albuterol  (PROVENTIL HFA;VENTOLIN HFA) 108 (90 Base) MCG/ACT inhaler Inhale into the lungs. 11/19/17   [provider]  Calcium Carbonate-Vitamin D3 600-400 MG-UNIT TABS Take 1,500 mg by mouth 2 (two) times daily.    [provider]  dasatinib (SPRYCEL) 20 MG tablet Take by mouth. 09/11/17   [provider]  insulin glargine (LANTUS) 100 UNIT/ML injection Inject 20 Units into the skin every evening.    [provider]  insulin lispro (HUMALOG KWIKPEN) 100 UNIT/ML KiwkPen Give per sliding scale    [provider]  magnesium oxide (MAG-OX) 400 MG tablet Take 400 mg by mouth 2 (two) times daily.     [provider]  metFORMIN (GLUCOPHAGE) 1000 MG tablet Take by mouth. 10/04/17   [provider]  metoprolol tartrate (LOPRESSOR) 25 MG tablet Take 12.5 mg by mouth 2 (two) times daily.    [provider]  Multiple Vitamin (MULTIVITAMIN WITH MINERALS) TABS tablet Take 1 tablet by mouth daily.    [provider]  simvastatin (ZOCOR) 20 MG tablet **HOLD** until follow up with your physician. Previous instructions: Take 1 tablet (20 mg total) by mouth daily. 10/04/17   [provider]  voriconazole (VFEND) 200 MG tablet Take one tablet (296m) by mouth twice daily 10/04/17   [provider]  voriconazole (VFEND) 50 MG tablet Take 2 tablets (1063m with one 20058mablet to equal 300m76mice daily 12/13/17   [provider]     Critical care time: n/a     PaulGeorgann HousekeeperACNP-BC [facility] [phone]/2020 8:01 AM

## 2018-10-06 NOTE — Progress Notes (Signed)
 Initial Nutrition Assessment  DOCUMENTATION CODES:   Not applicable  INTERVENTION:   Tube Feeding: Vital 1.5 at 60 ml/hr Pro-Stat 30 mL BID Provides 2360 kcals, 127 g of protein 1094 mL of free  Water Meets 100% estimated calorie and protein needs   NUTRITION DIAGNOSIS:   Inadequate oral intake related to acute illness as evidenced by NPO status.  GOAL:   Patient will meet greater than or equal to 90% of their needs  MONITOR:   Diet advancement, TF tolerance, Skin, Weight trends, Labs  REASON FOR ASSESSMENT:   Rounds    ASSESSMENT:   58 yo male admitted with febrile neutropenia, strep bacteremia, acute on chronic normocytic anemia, thrombocytopenia, ARF, metabolic acidosis PMH includes ALL with last chemo on 09/30/18, cirrhosis, HTN, DM, B-12 deficiency, multiple myeloma  Noted pt awating [facility]  NPO, poorly responsive. Off pressors Cortrak placed today; MD agreeable to starting TF today.   Unable to obtain diet and weight history at this time.   Labs: CBGs 150-186 Meds: NS at 75 ml/hr   NUTRITION - FOCUSED PHYSICAL EXAM:  Unable to assess  Diet Order:   Diet Order            Diet NPO time specified  Diet effective now              EDUCATION NEEDS:   Not appropriate for education at this time  Skin:  Skin Assessment: Reviewed RN Assessment  Last BM:  9/14  Height:   Ht Readings from Last 1 Encounters:  09/25/2018 5' 7"  (1.702 m)    Weight:   Wt Readings from Last 1 Encounters:  10/14/2018 89.8 kg    Ideal Body Weight:  61.4 kg  BMI:  Body mass index is 31.01 kg/m.  Estimated Nutritional Needs:   Kcal:  2200-2400 kcals  Protein:  110-120 g  Fluid:  >/= 2 L   Harjot Zavadil MS, RDN, LDN, CNSC [phone] Pager  [phone] Weekend/On-Call Pager,

## 2018-10-06 NOTE — Progress Notes (Signed)
PHARMACY - PHYSICIAN COMMUNICATION CRITICAL VALUE ALERT - BLOOD CULTURE IDENTIFICATION (BCID)  John Singleton is an 58 y.o. male who presented to [facility] on 09/23/2018 with a chief complaint of febrile neutropenia. Has recurrent B-Cell ALL with recent chemo.    Name of physician (or Provider) Contacted: Dr. Oletta Darter   Current antibiotics: Vancomycin/Cefepime  Changes to prescribed antibiotics recommended:  CCM to continue broad spectrum coverage for now  Results for orders placed or performed during the hospital encounter of 10/01/2018  Blood Culture ID Panel (Reflexed) (Collected: 10/08/2018 10:56 PM)  Result Value Ref Range   Enterococcus species NOT DETECTED NOT DETECTED   Listeria monocytogenes NOT DETECTED NOT DETECTED   Staphylococcus species NOT DETECTED NOT DETECTED   Staphylococcus aureus (BCID) NOT DETECTED NOT DETECTED   Streptococcus species DETECTED (A) NOT DETECTED   Streptococcus agalactiae NOT DETECTED NOT DETECTED   Streptococcus pneumoniae NOT DETECTED NOT DETECTED   Streptococcus pyogenes NOT DETECTED NOT DETECTED   Acinetobacter baumannii NOT DETECTED NOT DETECTED   Enterobacteriaceae species NOT DETECTED NOT DETECTED   Enterobacter cloacae complex NOT DETECTED NOT DETECTED   Escherichia coli NOT DETECTED NOT DETECTED   Klebsiella oxytoca NOT DETECTED NOT DETECTED   Klebsiella pneumoniae NOT DETECTED NOT DETECTED   Proteus species NOT DETECTED NOT DETECTED   Serratia marcescens NOT DETECTED NOT DETECTED   Haemophilus influenzae NOT DETECTED NOT DETECTED   Neisseria meningitidis NOT DETECTED NOT DETECTED   Pseudomonas aeruginosa NOT DETECTED NOT DETECTED   Candida albicans NOT DETECTED NOT DETECTED   Candida glabrata NOT DETECTED NOT DETECTED   Candida krusei NOT DETECTED NOT DETECTED   Candida parapsilosis NOT DETECTED NOT DETECTED   Candida tropicalis NOT DETECTED NOT DETECTED    Narda Bonds 10/06/2018  12:36 AM

## 2018-10-06 NOTE — Progress Notes (Signed)
[facility] Progress Note Patient Name: John Singleton DOB: December 07, 1960 MRN: [id number]   Date of Service  10/06/2018  HPI/Events of Note  Agitation - No response reported to Haldol 0.5 mg earlier in the shift.   eICU Interventions  Will order: 1. Haldol 1 mg IV Q 3 hours PRN agitation. 2. Monitor QTc interval Q 3 hours. Notify MD if QTc interval > 500 milliseconds.      Intervention Category Major Interventions: Delirium, psychosis, severe agitation - evaluation and management  [name],[name] 10/06/2018, 3:04 AM

## 2018-10-07 ENCOUNTER — Inpatient Hospital Stay (HOSPITAL_COMMUNITY): Payer: Medicare Other

## 2018-10-07 DIAGNOSIS — R7881 Bacteremia: Secondary | ICD-10-CM

## 2018-10-07 LAB — ECHOCARDIOGRAM COMPLETE
Height: 67 in
Weight: 3139.35 oz

## 2018-10-07 LAB — COMPREHENSIVE METABOLIC PANEL
ALT: 56 U/L — ABNORMAL HIGH (ref 0–44)
AST: 65 U/L — ABNORMAL HIGH (ref 15–41)
Albumin: 2.2 g/dL — ABNORMAL LOW (ref 3.5–5.0)
Alkaline Phosphatase: 95 U/L (ref 38–126)
Anion gap: 8 (ref 5–15)
BUN: 38 mg/dL — ABNORMAL HIGH (ref 6–20)
CO2: 20 mmol/L — ABNORMAL LOW (ref 22–32)
Calcium: 7.9 mg/dL — ABNORMAL LOW (ref 8.9–10.3)
Chloride: 119 mmol/L — ABNORMAL HIGH (ref 98–111)
Creatinine, Ser: 0.7 mg/dL (ref 0.61–1.24)
GFR calc Af Amer: >60 mL/min (ref >60)
GFR calc non Af Amer: >60 mL/min (ref >60)
Glucose, Bld: 210 mg/dL — ABNORMAL HIGH (ref 70–99)
Potassium: 3.1 mmol/L — ABNORMAL LOW (ref 3.5–5.1)
Sodium: 147 mmol/L — ABNORMAL HIGH (ref 135–145)
Total Bilirubin: 11.8 mg/dL — ABNORMAL HIGH (ref 0.3–1.2)
Total Protein: 4.4 g/dL — ABNORMAL LOW (ref 6.5–8.1)

## 2018-10-07 LAB — GLUCOSE, CAPILLARY
Glucose-Capillary: 173 mg/dL — ABNORMAL HIGH (ref 70–99)
Glucose-Capillary: 184 mg/dL — ABNORMAL HIGH (ref 70–99)
Glucose-Capillary: 187 mg/dL — ABNORMAL HIGH (ref 70–99)
Glucose-Capillary: 198 mg/dL — ABNORMAL HIGH (ref 70–99)
Glucose-Capillary: 225 mg/dL — ABNORMAL HIGH (ref 70–99)
Glucose-Capillary: 257 mg/dL — ABNORMAL HIGH (ref 70–99)

## 2018-10-07 LAB — PREPARE PLATELET PHERESIS: Unit division: 0

## 2018-10-07 LAB — PHOSPHORUS
Phosphorus: 2.3 mg/dL — ABNORMAL LOW (ref 2.5–4.6)
Phosphorus: 3.1 mg/dL (ref 2.5–4.6)

## 2018-10-07 LAB — CBC WITH DIFFERENTIAL/PLATELET
Abs Immature Granulocytes: 0.14 10*3/uL — ABNORMAL HIGH (ref 0.00–0.07)
Basophils Absolute: 0 10*3/uL (ref 0.0–0.1)
Basophils Relative: 1 %
Eosinophils Absolute: 0 10*3/uL (ref 0.0–0.5)
Eosinophils Relative: 0 %
HCT: 23.7 % — ABNORMAL LOW (ref 39.0–52.0)
Hemoglobin: 8.6 g/dL — ABNORMAL LOW (ref 13.0–17.0)
Immature Granulocytes: 18 %
Lymphocytes Relative: 1 %
Lymphs Abs: 0 10*3/uL — ABNORMAL LOW (ref 0.7–4.0)
MCH: 33.2 pg (ref 26.0–34.0)
MCHC: 36.3 g/dL — ABNORMAL HIGH (ref 30.0–36.0)
MCV: 91.5 fL (ref 80.0–100.0)
Monocytes Absolute: 0.1 10*3/uL (ref 0.1–1.0)
Monocytes Relative: 14 %
Neutro Abs: 0.5 10*3/uL — ABNORMAL LOW (ref 1.7–7.7)
Neutrophils Relative %: 66 %
Platelets: 13 10*3/uL — CL (ref 150–400)
RBC: 2.59 MIL/uL — ABNORMAL LOW (ref 4.22–5.81)
RDW: 17.5 % — ABNORMAL HIGH (ref 11.5–15.5)
WBC: 0.8 10*3/uL — CL (ref 4.0–10.5)
nRBC: 0 % (ref 0.0–0.2)

## 2018-10-07 LAB — URINE CULTURE: Culture: 5000 — AB

## 2018-10-07 LAB — BPAM PLATELET PHERESIS
Blood Product Expiration Date: 202009172359
ISSUE DATE / TIME: 202009141100
Unit Type and Rh: 5100

## 2018-10-07 LAB — MAGNESIUM
Magnesium: 2.3 mg/dL (ref 1.7–2.4)
Magnesium: 2.2 mg/dL (ref 1.7–2.4)

## 2018-10-07 LAB — PROTIME-INR
INR: 1.3 — ABNORMAL HIGH (ref 0.8–1.2)
Prothrombin Time: 16.4 seconds — ABNORMAL HIGH (ref 11.4–15.2)

## 2018-10-07 LAB — PATHOLOGIST SMEAR REVIEW

## 2018-10-07 MED ORDER — ORAL CARE MOUTH RINSE
15.0000 mL | Freq: Two times a day (BID) | OROMUCOSAL | Status: DC
  Administered 2018-10-09: 15 mL via OROMUCOSAL

## 2018-10-07 MED ORDER — POTASSIUM PHOSPHATES 15 MMOLE/5ML IV SOLN
30.0000 mmol | Freq: Once | INTRAVENOUS | Status: AC
  Administered 2018-10-07: 30 mmol via INTRAVENOUS
  Filled 2018-10-07: qty 10

## 2018-10-07 MED ORDER — FREE WATER: 100.0000 mL | Freq: Three times a day (TID) | Status: DC

## 2018-10-07 MED ORDER — POTASSIUM CHLORIDE 20 MEQ/15ML (10%) PO SOLN
40.0000 meq | Freq: Once | ORAL | Status: AC
  Administered 2018-10-07: 40 meq
  Filled 2018-10-07: qty 30

## 2018-10-07 MED ORDER — PERFLUTREN LIPID MICROSPHERE
1.0000 mL | INTRAVENOUS | Status: AC | PRN
  Administered 2018-10-07: 15:00:00 2 mL via INTRAVENOUS
  Filled 2018-10-07: qty 10

## 2018-10-07 MED ORDER — ACYCLOVIR 200 MG/5ML PO SUSP
400.0000 mg | Freq: Two times a day (BID) | ORAL | Status: DC
  Administered 2018-10-07 – 2018-10-08 (×3): 400 mg
  Filled 2018-10-07 (×6): qty 10

## 2018-10-07 MED ORDER — CHLORHEXIDINE GLUCONATE 0.12 % MT SOLN
15.0000 mL | Freq: Two times a day (BID) | OROMUCOSAL | Status: DC
  Administered 2018-10-07 – 2018-10-09 (×3): 15 mL via OROMUCOSAL
  Filled 2018-10-07: qty 15

## 2018-10-07 MED ORDER — INSULIN ASPART 100 UNIT/ML ~~LOC~~ SOLN
0.0000 [IU] | SUBCUTANEOUS | Status: DC
  Administered 2018-10-07: 5 [IU] via SUBCUTANEOUS
  Administered 2018-10-07: 3 [IU] via SUBCUTANEOUS
  Administered 2018-10-07 – 2018-10-08 (×5): 2 [IU] via SUBCUTANEOUS
  Administered 2018-10-08: 3 [IU] via SUBCUTANEOUS
  Administered 2018-10-08: 2 [IU] via SUBCUTANEOUS

## 2018-10-07 NOTE — Progress Notes (Signed)
 Nutrition Follow-up  DOCUMENTATION CODES:   Non-severe (moderate) malnutrition in context of chronic illness  INTERVENTION:   Tube Feeding: Vital 1.5 at 60 ml/hr Pro-Stat 30 mL BID Provides 2360 kcals, 127 g of protein 1094 mL of free  Water Meets 100% estimated calorie and protein needs  NUTRITION DIAGNOSIS:   Moderate Malnutrition related to chronic illness as evidenced by moderate muscle depletion, moderate fat depletion.  Being addressed via TF   GOAL:   Patient will meet greater than or equal to 90% of their needs  Progressing  MONITOR:   Diet advancement, TF tolerance, Skin, Weight trends, Labs  REASON FOR ASSESSMENT:   Rounds    ASSESSMENT:   58 yo male admitted with febrile neutropenia, strep bacteremia, acute on chronic normocytic anemia, thrombocytopenia, ARF, metabolic acidosis PMH includes ALL with last chemo on 09/30/18, cirrhosis, HTN, DM, B-12 deficiency, multiple myeloma   Pt continues with AMS, does not open eyes, does not follow commands  Pt tolerating Vital 1.5 TF at rate of 40 ml/hr, titrating per orders to goal rate of 60 ml/hr  Labs: sodium 147 (H), Creatinine wdl, potassium 3.1 (L), phosphorus 2.3 (L) Meds: potassium phosphate  NUTRITION - FOCUSED PHYSICAL EXAM:    Most Recent Value  Orbital Region  Severe depletion  Upper Arm Region  Mild depletion  Thoracic and Lumbar Region  Unable to assess [significant abdmonial distention/asites]  Buccal Region  Moderate depletion  Temple Region  Severe depletion  Clavicle Bone Region  Moderate depletion  Clavicle and Acromion Bone Region  Moderate depletion  Scapular Bone Region  Moderate depletion  Dorsal Hand  Unable to assess  Patellar Region  Moderate depletion  Anterior Thigh Region  Moderate depletion  Posterior Calf Region  Moderate depletion       Diet Order:   Diet Order            Diet NPO time specified  Diet effective now              EDUCATION NEEDS:   Not  appropriate for education at this time  Skin:  Skin Assessment: Reviewed RN Assessment  Last BM:  9/15  Height:   Ht Readings from Last 1 Encounters:  10/16/2018 5' 7"  (1.702 m)    Weight:   Wt Readings from Last 1 Encounters:  10/07/18 89 kg    Ideal Body Weight:  61.4 kg  BMI:  Body mass index is 30.73 kg/m.  Estimated Nutritional Needs:   Kcal:  2200-2400 kcals  Protein:  110-120 g  Fluid:  >/= 2 L    Jalayah Gutridge MS, RDN, LDN, CNSC [phone] Pager  [phone] Weekend/On-Call Pager

## 2018-10-07 NOTE — Progress Notes (Addendum)
NAME:  John Singleton, MRN:  [id number], DOB:  April 15, 1960, LOS: 2 ADMISSION DATE:  10/18/2018, CONSULTATION DATE:  10/05/2018 REFERRING MD:  Dr Maudie Mercury, CHIEF COMPLAINT:  FEVER   Brief History   58 yr old M w/ PMHx sig for B-cell ALL, multiple myeloma, HLD, HTN, DM, Cirrhosis presenting with Febrile Neutropenia. LA 7.3>> 4.7 PCCM asked to evaluate due to hypotension post 4L. [facility] from [facility] and required pressors.   Past Medical History  .Marland Kitchen Active Ambulatory Problems    Diagnosis Date Noted  . HTN (hypertension)   . Multiple myeloma in remission ([place]) 07/30/2015  . DM2 (diabetes mellitus, type 2) ([place]) 07/30/2015  . Thrombocytopenia ([place]) 07/30/2015  . Leucopenia 07/30/2015  . Anemia 07/30/2015  . B12 deficiency 10/23/2015  . Allergic transfusion reaction 08/18/2012  . H/O stem cell transplant ([place]) 08/07/2012  . B-cell acute lymphoblastic leukemia (ALL) ([place]) 06/13/2017  . Pancytopenia ([place]) 06/13/2017  . Postural hypotension 08/04/2017   Resolved Ambulatory Problems    Diagnosis Date Noted  . No Resolved Ambulatory Problems   Past Medical History:  Diagnosis Date  . Cancer ([place])   . Cirrhosis ([place])   . Diabetes mellitus without complication ([place])   . Multiple myeloma ([place])   . Pneumonia   . Sepsis(995.91)   . Stem cells transplant status ([place])     Consults:  9/13>>>PCCM Heme/Onc  Procedures:  none  Significant Diagnostic Tests:  CTH w/o contrast FINDINGS: Brain: There is no mass, hemorrhage or extra-axial collection. The size and configuration of the ventricles and extra-axial CSF spaces are normal. The brain parenchyma is normal, without acute or chronic infarction. Vascular: No abnormal hyperdensity of the major intracranial arteries or dural venous sinuses. No intracranial atherosclerosis. Skull: The visualized skull base, calvarium and extracranial soft tissues are normal. Sinuses/Orbits: No fluid levels or advanced mucosal thickening of the visualized  paranasal sinuses. No mastoid or middle ear effusion. The orbits are normal.  CXR 9/12: Questionable peripheral patchy opacity in the left mid lung. Mild vascular congestion.  Micro Data:  9/12>>>SARSCOV2 negative 9/12>>>MRSA PCR negative Urine 9/12 > enterococcus faecalis on 5K colonies Blood 9/12 > Streptococcus mitis/oralis Blood 9/15 >  Antimicrobials:  9/12 > Flagyl Vancomycin 9/12 > 9/14 Cefepime 9/12 >  SUBJECTIVE: No acute events overnight. Remains lethargic and uncommunicative.   Objective   Blood pressure 107/87, pulse 97, temperature (!) 97.3 F (36.3 C), temperature source Axillary, resp. rate 11, height 5' 7"  (1.702 m), weight 89 kg, SpO2 97 %.        Intake/Output Summary (Last 24 hours) at 10/07/2018 0752 Last data filed at 10/07/2018 0600 Gross per 24 hour  Intake 2081.13 ml  Output 400 ml  Net 1681.13 ml   Filed Weights   09/24/2018 2226 10/07/18 0312  Weight: 89.8 kg 89 kg   Examination:  General:  Middle aged male in NAD. Seems uncomfortable. Neuro:  Lethargic, agitated at times and tries to crawl out of bed.  HEENT:  [place]/AT, No JVD noted, PERRL. Scleral icterus.  Cardiovascular:  Tachy, regular, no MRG. Lungs:  Clear bilateral breath sounds. Abdomen:  Soft non-tender non-distended Musculoskeletal:  No acute deformity, moves all four extremities.  Skin:  Intact, MMM  Assessment & Plan:  Acute Encephalopathy: Metabolic vs  Ammonia mildly elevated at 37. Plan: - F/u smear - Low threshold to DC lactulose. - Aspiration precautions - doubt he would be able to tolerate MRI  Febrile Neutropenia: Bacteremia: Blood cultures now positive for strep mitis/oralis Plan: -  Contact and Droplet precautions - Continue on Cefepime IV - Echo pending - F/u Blood cx for sensitivities - Repeat BC 9/15 - If repeat BC remain positive we will likely need to remove his port-a-cath.   Recurrent B-cell ALL last chemo 9/8 Pancytopenia  - Oncology/Heme following -  Keep Hgb > 7 - Keep plt > 10k (no transfusion needed this morning) - Daily G-CSF until [place] > 1.5  - Daily calls to [facility] for transfer. No bed (this is his oncology home)  Liver Cirrhosis: INR 3.0, Albumin 2.9 - follow coags - lactulose as above  Acute Kidney Injury: Most likely secondary to hypoperfusion vs Hepatorenal syndrome - Monitor UOP - Trend BMP - DC saline infusion - Add free water  DM - add SSI and CBG monitoring  Resolved problems - Septic shock, Anion Gap Metabolic Acidosis:     Best practice:  Diet: NPO Pain/Anxiety/Delirium protocol (if indicated): not indicated  VAP protocol (if indicated): not intubated DVT prophylaxis: no prophylaxis in lieu of thrombocytopenia GI prophylaxis: no PPI due to significant thrombocytopenia Glucose control: has a h/o DM if BG >110m/dl will start ISS Mobility: bedrest Code Status:No CPR or intubation.  Family Communication: Family updated Disposition: ICU   Labs   CBC: Recent Labs  Lab 10/12/2018 2220 10/05/18 0915 10/05/18 2030 10/06/18 0700 10/06/18 1501 10/06/18 1836 10/07/18 0605  WBC 0.1* 0.2*  --  0.3* 0.4* 0.5* 0.8*  NEUTROABS 0.0* 0.2*  --  0.3*  --   --  0.5*  HGB 6.2* 5.5*  --  6.3* 9.0* 8.9* 8.6*  HCT 18.0* 15.4*  --  18.5* 24.4* 24.0* 23.7*  MCV 97.8 94.5  --  95.9 90.0 90.2 91.5  PLT 5* <5* 18* 9* 17* 17* 13*    Basic Metabolic Panel: Recent Labs  Lab 10/02/2018 2220 10/05/18 0915 10/06/18 0700 10/06/18 1836 10/07/18 0605  NA 134* 136 142  --  147*  K 4.5 4.0 3.5  --  3.1*  CL 103 111 115*  --  119*  CO2 14* 17* 18*  --  20*  GLUCOSE 85 150* 185*  --  210*  BUN 45* 38* 34*  --  38*  CREATININE 1.24 1.10 0.84  --  0.70  CALCIUM 8.3* 7.6* 7.4*  --  7.9*  MG  --   --  2.2 2.3 2.3  PHOS  --   --  2.8 2.4* 2.3*   GFR: Estimated Creatinine Clearance: 108.5 mL/min (by C-G formula based on SCr of 0.7 mg/dL). Recent Labs  Lab 10/10/2018 2220 10/05/18 0055  10/06/18 0700 10/06/18 1501 10/06/18  1515 10/06/18 1836 10/07/18 0605  WBC 0.1*  --    < > 0.3* 0.4*  --  0.5* 0.8*  LATICACIDVEN 7.3* 4.7*  --   --   --  1.5 1.5  --    < > = values in this interval not displayed.    Liver Function Tests: Recent Labs  Lab 10/03/2018 2220 10/05/18 0915 10/06/18 0700 10/07/18 0605  AST 173* 120* 99* 65*  ALT 50* 47* 55* 56*  ALKPHOS 130* 105 84 95  BILITOT 4.5* 5.5* 7.4* 11.8*  PROT 5.1* 4.3* 4.3* 4.4*  ALBUMIN 2.9* 2.2* 2.2* 2.2*   No results for input(s): LIPASE, AMYLASE in the last 168 hours. Recent Labs  Lab 09/29/2018 2220 10/05/18 0915  AMMONIA 38* 37*    ABG    Component Value Date/Time   PHART 7.405 10/05/2018 0107   PCO2ART 23.7 (L) 10/05/2018 0107  PO2ART 106 10/05/2018 0107   HCO3 17.5 (L) 10/05/2018 0107   ACIDBASEDEF 9.3 (H) 10/05/2018 0107   O2SAT 97.6 10/05/2018 0107     Coagulation Profile: Recent Labs  Lab 10/20/2018 2220 10/05/18 1136  INR 2.4* 3.0*    Cardiac Enzymes: No results for input(s): CKTOTAL, CKMB, CKMBINDEX, TROPONINI in the last 168 hours.  HbA1C: Hgb A1c MFr Bld  Date/Time Value Ref Range Status  10/05/2018 09:15 AM 6.3 (H) 4.8 - 5.6 % Final    Comment:    (NOTE)         Prediabetes: 5.7 - 6.4         Diabetes: >6.4         Glycemic control for adults with diabetes: <7.0     CBG: Recent Labs  Lab 10/06/18 0733 10/06/18 1141 10/06/18 1952 10/06/18 2342 10/07/18 0405  GLUCAP 186* 184* 153* 198* 173*    Review of Systems:     Past Medical History  He,  has a past medical history of B12 deficiency (10/23/2015), Cancer ([place]), Cirrhosis ([place]), Diabetes mellitus without complication ([place]), HTN (hypertension), Multiple myeloma ([place]), Multiple myeloma in remission ([place]) (07/30/2015), Pneumonia, Sepsis(995.91), and Stem cells transplant status ([place]).   Surgical History    Past Surgical History:  Procedure Laterality Date  . LIMBAL STEM CELL TRANSPLANT    . NOSE SURGERY     "when I was a child"     Social History    reports that he has never smoked. He has never used smokeless tobacco. He reports that he does not drink alcohol or use drugs.   Family History   His family history includes CAD in his mother; Stroke in his father.   Allergies No Known Allergies   Home Medications  Prior to Admission medications   Medication Sig Start Date End Date Taking? Authorizing Provider  albuterol (PROVENTIL HFA;VENTOLIN HFA) 108 (90 Base) MCG/ACT inhaler Inhale into the lungs. 11/19/17   [provider]  Calcium Carbonate-Vitamin D3 600-400 MG-UNIT TABS Take 1,500 mg by mouth 2 (two) times daily.    [provider]  dasatinib (SPRYCEL) 20 MG tablet Take by mouth. 09/11/17   [provider]  insulin glargine (LANTUS) 100 UNIT/ML injection Inject 20 Units into the skin every evening.    [provider]  insulin lispro (HUMALOG KWIKPEN) 100 UNIT/ML KiwkPen Give per sliding scale    [provider]  magnesium oxide (MAG-OX) 400 MG tablet Take 400 mg by mouth 2 (two) times daily.     [provider]  metFORMIN (GLUCOPHAGE) 1000 MG tablet Take by mouth. 10/04/17   [provider]  metoprolol tartrate (LOPRESSOR) 25 MG tablet Take 12.5 mg by mouth 2 (two) times daily.    [provider]  Multiple Vitamin (MULTIVITAMIN WITH MINERALS) TABS tablet Take 1 tablet by mouth daily.    [provider]  simvastatin (ZOCOR) 20 MG tablet **HOLD** until follow up with your physician. Previous instructions: Take 1 tablet (20 mg total) by mouth daily. 10/04/17   [provider]  voriconazole (VFEND) 200 MG tablet Take one tablet (291m) by mouth twice daily 10/04/17   [provider]  voriconazole (VFEND) 50 MG tablet Take 2 tablets (1081m with one 20037mablet to equal 300m56mice daily 12/13/17   [provider]     Critical care time: n/a     PaulGeorgann HousekeeperACNP-BC [place] [phone]/2020 7:52 AM

## 2018-10-07 NOTE — Progress Notes (Signed)
  Echocardiogram 2D Echocardiogram has been performed.  Junetta Hearn L Androw 10/07/2018, 3:01 PM

## 2018-10-08 ENCOUNTER — Inpatient Hospital Stay (HOSPITAL_COMMUNITY): Payer: Medicare Other

## 2018-10-08 DIAGNOSIS — B955 Unspecified streptococcus as the cause of diseases classified elsewhere: Secondary | ICD-10-CM

## 2018-10-08 DIAGNOSIS — R7881 Bacteremia: Secondary | ICD-10-CM | POA: Diagnosis present

## 2018-10-08 DIAGNOSIS — E440 Moderate protein-calorie malnutrition: Secondary | ICD-10-CM | POA: Diagnosis present

## 2018-10-08 DIAGNOSIS — C9100 Acute lymphoblastic leukemia not having achieved remission: Secondary | ICD-10-CM

## 2018-10-08 LAB — COMPREHENSIVE METABOLIC PANEL
ALT: 50 U/L — ABNORMAL HIGH (ref 0–44)
AST: 45 U/L — ABNORMAL HIGH (ref 15–41)
Albumin: 2.1 g/dL — ABNORMAL LOW (ref 3.5–5.0)
Alkaline Phosphatase: 108 U/L (ref 38–126)
Anion gap: 6 (ref 5–15)
BUN: 35 mg/dL — ABNORMAL HIGH (ref 6–20)
CO2: 21 mmol/L — ABNORMAL LOW (ref 22–32)
Calcium: 8.1 mg/dL — ABNORMAL LOW (ref 8.9–10.3)
Chloride: 125 mmol/L — ABNORMAL HIGH (ref 98–111)
Creatinine, Ser: 0.53 mg/dL — ABNORMAL LOW (ref 0.61–1.24)
GFR calc Af Amer: >60 mL/min (ref >60)
GFR calc non Af Amer: >60 mL/min (ref >60)
Glucose, Bld: 187 mg/dL — ABNORMAL HIGH (ref 70–99)
Potassium: 3.6 mmol/L (ref 3.5–5.1)
Sodium: 152 mmol/L — ABNORMAL HIGH (ref 135–145)
Total Bilirubin: 11.8 mg/dL — ABNORMAL HIGH (ref 0.3–1.2)
Total Protein: 4.5 g/dL — ABNORMAL LOW (ref 6.5–8.1)

## 2018-10-08 LAB — CBC WITH DIFFERENTIAL/PLATELET
Abs Immature Granulocytes: 0.01 10*3/uL (ref 0.00–0.07)
Basophils Absolute: 0 10*3/uL (ref 0.0–0.1)
Basophils Relative: 1 %
Eosinophils Absolute: 0 10*3/uL (ref 0.0–0.5)
Eosinophils Relative: 0 %
HCT: 23.9 % — ABNORMAL LOW (ref 39.0–52.0)
Hemoglobin: 8.4 g/dL — ABNORMAL LOW (ref 13.0–17.0)
Immature Granulocytes: 1 %
Lymphocytes Relative: 1 %
Lymphs Abs: 0 10*3/uL — ABNORMAL LOW (ref 0.7–4.0)
MCH: 33.1 pg (ref 26.0–34.0)
MCHC: 35.1 g/dL (ref 30.0–36.0)
MCV: 94.1 fL (ref 80.0–100.0)
Monocytes Absolute: 0.2 10*3/uL (ref 0.1–1.0)
Monocytes Relative: 12 %
Neutro Abs: 1.5 10*3/uL — ABNORMAL LOW (ref 1.7–7.7)
Neutrophils Relative %: 85 %
Platelets: 11 10*3/uL — CL (ref 150–400)
RBC: 2.54 MIL/uL — ABNORMAL LOW (ref 4.22–5.81)
RDW: 18.4 % — ABNORMAL HIGH (ref 11.5–15.5)
WBC: 1.7 10*3/uL — ABNORMAL LOW (ref 4.0–10.5)
nRBC: 0 % (ref 0.0–0.2)

## 2018-10-08 LAB — GLUCOSE, CAPILLARY
Glucose-Capillary: 159 mg/dL — ABNORMAL HIGH (ref 70–99)
Glucose-Capillary: 197 mg/dL — ABNORMAL HIGH (ref 70–99)
Glucose-Capillary: 199 mg/dL — ABNORMAL HIGH (ref 70–99)
Glucose-Capillary: 217 mg/dL — ABNORMAL HIGH (ref 70–99)
Glucose-Capillary: 234 mg/dL — ABNORMAL HIGH (ref 70–99)

## 2018-10-08 LAB — CULTURE, BLOOD (ROUTINE X 2)

## 2018-10-08 LAB — MAGNESIUM: Magnesium: 2.1 mg/dL (ref 1.7–2.4)

## 2018-10-08 LAB — PHOSPHORUS: Phosphorus: 2.8 mg/dL (ref 2.5–4.6)

## 2018-10-08 LAB — AMMONIA: Ammonia: 28 umol/L (ref 9–35)

## 2018-10-08 MED ORDER — GLYCOPYRROLATE 1 MG PO TABS
1.0000 mg | ORAL_TABLET | ORAL | Status: DC | PRN
  Filled 2018-10-08: qty 1

## 2018-10-08 MED ORDER — FREE WATER
250.0000 mL | Freq: Three times a day (TID) | Status: DC
  Administered 2018-10-08 (×2): 250 mL

## 2018-10-08 MED ORDER — SODIUM CHLORIDE 0.9 % IV SOLN
2.0000 g | INTRAVENOUS | Status: DC
  Administered 2018-10-08: 2 g via INTRAVENOUS
  Filled 2018-10-08: qty 20

## 2018-10-08 MED ORDER — GLYCOPYRROLATE 0.2 MG/ML IJ SOLN
0.2000 mg | INTRAMUSCULAR | Status: DC | PRN
  Administered 2018-10-08 – 2018-10-09 (×3): 0.2 mg via INTRAVENOUS
  Filled 2018-10-08 (×2): qty 1

## 2018-10-08 MED ORDER — DIPHENHYDRAMINE HCL 50 MG/ML IJ SOLN: 25.0000 mg | INTRAMUSCULAR | Status: DC | PRN

## 2018-10-08 MED ORDER — MORPHINE SULFATE (PF) 2 MG/ML IV SOLN
2.0000 mg | INTRAVENOUS | Status: DC | PRN
  Administered 2018-10-08: 4 mg via INTRAVENOUS
  Administered 2018-10-08: 18:00:00 2 mg via INTRAVENOUS
  Administered 2018-10-09 (×4): 4 mg via INTRAVENOUS
  Filled 2018-10-08 (×3): qty 2
  Filled 2018-10-08: qty 1
  Filled 2018-10-08 (×2): qty 2

## 2018-10-08 MED ORDER — GLYCOPYRROLATE 0.2 MG/ML IJ SOLN
0.2000 mg | INTRAMUSCULAR | Status: DC | PRN
  Filled 2018-10-08: qty 1

## 2018-10-08 MED ORDER — ACETAMINOPHEN 325 MG PO TABS: 650.0000 mg | ORAL_TABLET | Freq: Four times a day (QID) | ORAL | Status: DC | PRN

## 2018-10-08 MED ORDER — ACETAMINOPHEN 650 MG RE SUPP: 650.0000 mg | Freq: Four times a day (QID) | RECTAL | Status: DC | PRN

## 2018-10-08 MED ORDER — POLYVINYL ALCOHOL 1.4 % OP SOLN
1.0000 [drp] | Freq: Four times a day (QID) | OPHTHALMIC | Status: DC | PRN
  Filled 2018-10-08: qty 15

## 2018-10-08 NOTE — Progress Notes (Signed)
NAME:  John Singleton, MRN:  [id number], DOB:  [birth year], LOS: 3 ADMISSION DATE:  09/26/2018, CONSULTATION DATE:  10/05/2018 REFERRING MD:  Dr Maudie Mercury, CHIEF COMPLAINT:  FEVER   Brief History   58 yr old M w/ PMHx sig for B-cell ALL, multiple myeloma, HLD, HTN, DM, Cirrhosis presenting with Febrile Neutropenia. LA 7.3>> 4.7 PCCM asked to evaluate due to hypotension post 4L. [facility] from [facility] and [facility] pressors.   Past Medical History  .Marland Kitchen Active Ambulatory Problems    Diagnosis Date Noted   HTN (hypertension)    Multiple myeloma in remission ([place]) 07/30/2015   DM2 (diabetes mellitus, type 2) ([place]) 07/30/2015   Thrombocytopenia ([place]) 07/30/2015   Leucopenia 07/30/2015   Anemia 07/30/2015   B12 deficiency 10/23/2015   Allergic transfusion reaction 08/18/2012   H/O stem cell transplant ([place]) 08/07/2012   B-cell acute lymphoblastic leukemia (ALL) ([place]) 06/13/2017   Pancytopenia ([place]) 06/13/2017   Postural hypotension 08/04/2017   Resolved Ambulatory Problems    Diagnosis Date Noted   No Resolved Ambulatory Problems   Past Medical History:  Diagnosis Date   Cancer ([place])    Cirrhosis ([place])    Diabetes mellitus without complication ([place])    Multiple myeloma ([place])    Pneumonia    Sepsis(995.91)    Stem cells transplant status ([place])     Consults:  Heme/Onc made recs 9/13 and s/o  Procedures:  none  Significant Diagnostic Tests:  CTH w/o  9/12 > non-acute RUQ [place] 9/13 > Cirrhosis with moderate volume of ascites.Biliary sludge in the gallbladder, with gallbladder wall thickening. However, the gallbladder is not distended and there was no sonographic Murphy's sign on examination, suggesting against acute cholecystitis. Echo 9/15 > Technically difficult study. LVEF 35-40%, global hypokinesis, The mitral valve is abnormal. Mild thickening of the mitral valve leaflet. No obvious large valvular vegetations, but cannot exclude endocarditis based on this  study.  Micro Data:  9/12>>>SARSCOV2 negative 9/12>>>MRSA PCR negative Urine 9/12 > enterococcus faecalis on 5K colonies Blood 9/12 > Streptococcus mitis/oralis >>> Blood 9/15 >  Antimicrobials:  9/12 > Flagyl Vancomycin 9/12 > 9/14 Cefepime 9/12 >  SUBJECTIVE: No acute events overnight. Echo unrevealing, technically limited.   Objective   Blood pressure (!) 142/111, pulse (!) 113, temperature 98.2 F (36.8 C), temperature source Axillary, resp. rate (!) 23, height 5' 7"  (1.702 m), weight 90.6 kg, SpO2 100 %.        Intake/Output Summary (Last 24 hours) at 10/08/2018 0757 Last data filed at 10/08/2018 0700 Gross per 24 hour  Intake 1830.05 ml  Output 450 ml  Net 1380.05 ml   Filed Weights   10/14/2018 2226 10/07/18 0312 10/08/18 0500  Weight: 89.8 kg 89 kg 90.6 kg   Examination:  General:  Middle aged male in NAD. Seems uncomfortable. Neuro:  Lethargic, agitated at times and tries to crawl out of bed.  HEENT:  [place]/AT, No JVD noted, PERRL. Scleral icterus.  Cardiovascular:  Tachy, regular, no MRG. Lungs:  Clear bilateral breath sounds. Abdomen:  Soft non-tender non-distended Musculoskeletal:  No acute deformity, moves all four extremities.  Skin:  Intact, MMM  Assessment & Plan:  Acute Encephalopathy: Metabolic vs  Ammonia mildly elevated at 37. Given bacteremia could there be septic embolization? Plan: - lactulose off - Aspiration precautions - would not be able to cooperate with MRI  Febrile Neutropenia: Bacteremia: Blood cultures now positive for strep mitis/oralis. Echo 9/15 technically limited, but no obvious vegetation noted. Unable to tolerate TEE  or cooperate with MRI. Even if vegetation was found, at this point he is a terribly poor surgical candidate.  Plan: - Neutropenic precautions - Continue on Cefepime IV while we await susceptibilities.  - Repeat [facility] 9/15 pending - If repeat BC remain positive we will likely need to remove his port-a-cath.    Recurrent B-cell ALL last chemo 9/8 Pancytopenia  - Oncology/Heme has signed off with below recommendations - Keep Hgb > 7 - Keep plt > 10k (no transfusion needed this morning) - DC G-CSF (Granix 374mg daily SQ) as ANA now 1.5 - Continue home ppx acyclovir, holding voriconazole (QTC)  [facility]. [facility])  Liver Cirrhosis: INR 3.0, Albumin 2.9 on admit - follow coags  Hypernatremia - increase free water  DM - add SSI and CBG monitoring  Resolved problems - Septic shock, Anion Gap Metabolic Acidosis, AKI    Best practice:  Diet: NPO Pain/Anxiety/Delirium protocol (if indicated): not indicated  VAP protocol (if indicated): not intubated DVT prophylaxis: no prophylaxis in lieu of thrombocytopenia GI prophylaxis: no due to significant thrombocytopenia Glucose control: SSI Mobility: bedrest Code Status:No CPR or intubation.  Family Communication: Brother planning to come in today. Will speak with him at that time.  Disposition: ICU   Labs   CBC: Recent Labs  Lab 10/03/2018 2220 10/05/18 0915  10/06/18 0700 10/06/18 1501 10/06/18 1836 10/07/18 0605 10/08/18 0622  WBC 0.1* 0.2*  --  0.3* 0.4* 0.5* 0.8* 1.7*  NEUTROABS 0.0* 0.2*  --  0.3*  --   --  0.5* 1.5*  HGB 6.2* 5.5*  --  6.3* 9.0* 8.9* 8.6* 8.4*  HCT 18.0* 15.4*  --  18.5* 24.4* 24.0* 23.7* 23.9*  MCV 97.8 94.5  --  95.9 90.0 90.2 91.5 94.1  PLT 5* <5*   < > 9* 17* 17* 13* 11*   < > = values in this interval not displayed.    Basic Metabolic Panel: Recent Labs  Lab 10/12/2018 2220 10/05/18 0915 10/06/18 0700 10/06/18 1836 10/07/18 0605 10/07/18 1624 10/08/18 0622  NA 134* 136 142  --  147*  --  152*  K 4.5 4.0 3.5  --  3.1*  --  3.6  CL 103 111 115*  --  119*  --  125*  CO2 14* 17* 18*  --  20*  --  21*  GLUCOSE 85 150* 185*  --  210*  --  187*  BUN 45* 38* 34*  --  38*  --  35*  CREATININE 1.24 1.10 0.84  --  0.70  --  0.53*  CALCIUM 8.3* 7.6*  7.4*  --  7.9*  --  8.1*  MG  --   --  2.2 2.3 2.3 2.2 2.1  PHOS  --   --  2.8 2.4* 2.3* 3.1 2.8   GFR: Estimated Creatinine Clearance: 109.4 mL/min (A) (by C-G formula based on SCr of 0.53 mg/dL (L)). Recent Labs  Lab 10/02/2018 2220 10/05/18 0055  10/06/18 1501 10/06/18 1515 10/06/18 1836 10/07/18 0605 10/08/18 0622  WBC 0.1*  --    < > 0.4*  --  0.5* 0.8* 1.7*  LATICACIDVEN 7.3* 4.7*  --   --  1.5 1.5  --   --    < > = values in this interval not displayed.    Liver Function Tests: Recent Labs  Lab 10/01/2018 2220 10/05/18 0915 10/06/18 0700 10/07/18 0605 10/08/18 0622  AST 173* 120* 99*  65* 45*  ALT 50* 47* 55* 56* 50*  ALKPHOS 130* 105 84 95 108  BILITOT 4.5* 5.5* 7.4* 11.8* 11.8*  PROT 5.1* 4.3* 4.3* 4.4* 4.5*  ALBUMIN 2.9* 2.2* 2.2* 2.2* 2.1*   No results for input(s): LIPASE, AMYLASE in the last 168 hours. Recent Labs  Lab 10/21/2018 2220 10/05/18 0915 10/08/18 0622  AMMONIA 38* 37* 28    ABG    Component Value Date/Time   PHART 7.405 10/05/2018 0107   PCO2ART 23.7 (L) 10/05/2018 0107   PO2ART 106 10/05/2018 0107   HCO3 17.5 (L) 10/05/2018 0107   ACIDBASEDEF 9.3 (H) 10/05/2018 0107   O2SAT 97.6 10/05/2018 0107     Coagulation Profile: Recent Labs  Lab 10/05/2018 2220 10/05/18 1136 10/07/18 0837  INR 2.4* 3.0* 1.3*    Cardiac Enzymes: No results for input(s): CKTOTAL, CKMB, CKMBINDEX, TROPONINI in the last 168 hours.  HbA1C: Hgb A1c MFr Bld  Date/Time Value Ref Range Status  10/05/2018 09:15 AM 6.3 (H) 4.8 - 5.6 % Final    Comment:    (NOTE)         Prediabetes: 5.7 - 6.4         Diabetes: >6.4         Glycemic control for adults with diabetes: <7.0     CBG: Recent Labs  Lab 10/07/18 1541 10/07/18 1957 10/08/18 0057 10/08/18 0457 10/08/18 0710  GLUCAP 187* 225* 217* 199* 159*     Critical care time: n/a     Georgann Housekeeper, AGACNP-BC [place] Pager [phone] or [phone]  10/08/2018 7:57  AM

## 2018-10-08 NOTE — Significant Event (Signed)
PCCM INTERVAL PROGRESS NOTE: Goals of Care  I have just finished meeting with Alam's brother and Janalyn Harder. We discussed his baseline health and baseline wishes for conservative management in the event of a critical illness. We discussed his acute illness current treatments including barriers to more aggressive care. Eeshan's condition has progressed to the point that his airway protection is now in question and he wishes not to be intubated. Considering the current condition and pre-acute illness wishes, Grayland Ormond has chosen to transition Jervis to comfort care at this time. He informs me that the rest of the family agrees with this decision.   Plan: No more lab draws Morphine PRN for comfort/dyspnea Suspect prognosis is hours to days.  Unrestricted visitation Consult case management in case inpatient hospice is required.   Georgann Housekeeper, AGACNP-BC [place] Pager [phone] or [phone]  10/08/2018 5:39 PM

## 2018-10-09 LAB — BPAM RBC
Blood Product Expiration Date: 202010112359
Blood Product Expiration Date: 202010112359
Blood Product Expiration Date: 202010112359
Blood Product Expiration Date: 202010112359
Blood Product Expiration Date: 202010182359
Blood Product Expiration Date: 202010182359
ISSUE DATE / TIME: 202009131452
ISSUE DATE / TIME: 202009132119
ISSUE DATE / TIME: 202009132309
ISSUE DATE / TIME: 202009132358
ISSUE DATE / TIME: 202009140955
Unit Type and Rh: 5100
Unit Type and Rh: 5100
Unit Type and Rh: 5100
Unit Type and Rh: 5100
Unit Type and Rh: 5100
Unit Type and Rh: 5100

## 2018-10-09 LAB — TYPE AND SCREEN
ABO/RH(D): O POS
Antibody Screen: NEGATIVE
Donor AG Type: NEGATIVE
Donor AG Type: NEGATIVE
Donor AG Type: NEGATIVE
Donor AG Type: NEGATIVE
Unit division: 0
Unit division: 0
Unit division: 0
Unit division: 0
Unit division: 0
Unit division: 0

## 2018-10-12 LAB — CULTURE, BLOOD (ROUTINE X 2)
Culture: NO GROWTH
Culture: NO GROWTH

## 2018-10-14 ENCOUNTER — Telehealth: Payer: Self-pay | Admitting: Emergency Medicine

## 2018-10-14 NOTE — Telephone Encounter (Incomplete)
10/14/2018 Received D/C from [place] will send to pulmonary for Dr. Lamonte Sakai to sign. PWR  10/14/18 received signed copy of D/C back from dr. Lamonte Sakai I received the original D?C from [facility] this afternoon. I will send the original D/C along with the Copy that was already signed by Dr. Lamonte Sakai to Pulmonary for him to sign again on 10/15/2018. PWR

## 2018-10-23 NOTE — Progress Notes (Signed)
NAME:  John Singleton, MRN:  [id number], DOB:  January 30, 1960, LOS: 4 ADMISSION DATE:  10/06/2018, CONSULTATION DATE:  10/05/2018 REFERRING MD:  Dr Maudie Mercury, CHIEF COMPLAINT:  FEVER   Brief History   58 yr old M w/ PMHx sig for B-cell ALL, multiple myeloma, HLD, HTN, DM, Cirrhosis presenting with Febrile Neutropenia. LA 7.3>> 4.7 PCCM asked to evaluate due to hypotension post 4L. [facility] and [facility] press[facility].   Past Medical History  .Marland Kitchen Active Ambulatory Problems    Diagnosis Date Noted  . HTN (hypertension)   . Multiple myeloma in remission ([place]) 07/30/2015  . DM2 (diabetes mellitus, type 2) ([place]) 07/30/2015  . Thrombocytopenia ([place]) 07/30/2015  . Leucopenia 07/30/2015  . Anemia 07/30/2015  . B12 deficiency 10/23/2015  . Allergic transfusion reaction 08/18/2012  . H/O stem cell transplant ([place]) 08/07/2012  . B-cell acute lymphoblastic leukemia (ALL) ([place]) 06/13/2017  . Pancytopenia ([place]) 06/13/2017  . Postural hypotension 08/04/2017   Resolved Ambulatory Problems    Diagnosis Date Noted  . No Resolved Ambulatory Problems   Past Medical History:  Diagnosis Date  . Cancer ([place])   . Cirrhosis ([place])   . Diabetes mellitus without complication ([place])   . Multiple myeloma ([place])   . Pneumonia   . Sepsis(995.91)   . Stem cells transplant status ([place])     Consults:  Heme/Onc made recs 9/13 and s/o  Procedures:  none  Significant Diagnostic Tests:  CTH w/o  9/12 > non-acute RUQ [place] 9/13 > Cirrhosis with moderate volume of ascites.Biliary sludge in the gallbladder, with gallbladder wall thickening. However, the gallbladder is not distended and there was no sonographic Murphy's sign on examination, suggesting against acute cholecystitis. Echo 9/15 > Technically difficult study. LVEF 35-40%, global hypokinesis, The mitral valve is abnormal. Mild thickening of the mitral valve leaflet. No obvious large valvular vegetations, but cannot exclude endocarditis based on this study.   Micro Data:  9/12>>>SARSCOV2 negative 9/12>>>MRSA PCR negative Urine 9/12 > enterococcus faecalis on 5K colonies Blood 9/12 > Streptococcus mitis/oralis >>>PCN intermediate resistance  Blood 9/15 >NGTD  Antimicrobials:  9/12 > Flagyl Vancomycin 9/12 > 9/14 Cefepime 9/12 >9/19  SUBJECTIVE: Transitioned to comfort measures only.   Objective   Blood pressure (!) 129/95, pulse 93, temperature (!) 97.5 F (36.4 C), temperature source Axillary, resp. rate (!) 21, height _0  (1.702 m), weight 90.6 kg, SpO2 100 %.        Intake/Output Summary (Last 24 hours) at 11-08-18 0743 Last data filed at 10/08/2018 1746 Gross per 24 hour  Intake 806 ml  Output 550 ml  Net 256 ml   Filed Weights   09/25/2018 2226 10/07/18 0312 10/08/18 0500  Weight: 89.8 kg 89 kg 90.6 kg   Examination:  General: appears comfortable and in NAD HENT: Normocephalic Neck: No JVD.  CV: RRR.  Lungs: BBS mild rhonchi throughout, prolonged inspiration and expiration, nonlabored ABD: +BS x4. SNT/ND. GU: [place]oley Skin: Slightly jaundiced, WD. In tact.  Neuro: Unresponsive   Assessment & Plan:  John Singleton transitioned to comfort measures only yesterday afternoon.  At this time he appears comfortable and has comfort measure orders in place.  No further labs or monitoring. Will transfer out of unit.  Hospice has been consulted.  He has been hospitalized for the following. Acute Encephalopathy Febrile Neutropenia  Recurrent B-cell ALL  Liver Cirrhosis Hypernatremia DM Resolved problems - Septic shock, Anion Gap Metabolic Acidosis, AKI    Best practice:  Comfort measures only  Labs: no labs or imaging due to comfort care only      Critical care time: n/a    Francine Graven, MSN, AGACNP  Pager [phone] or if no answer [phone] [facility] Pulmonary & Critical Care

## 2018-10-23 DEATH — deceased

## 2018-11-23 NOTE — Death Summary Note (Signed)
DEATH SUMMARY   Patient Details  Name: John Singleton MRN: [id number] DOB: 26-Sep-1960  Admission/Discharge Information   Admit Date:  10/25/18  Date of Death: Date of Death: October 30, 2018  Time of Death: Time of Death: 22  Length of Stay: 4  Referring Physician: [name], MD   Reason(s) for Hospitalization  Fever and encephalopathy   Diagnoses  Preliminary cause of death:  Secondary Diagnoses (including complications and co-morbidities):  Principal Problem:   Febrile neutropenia ([place]) Active Problems:   HTN (hypertension)   Multiple myeloma in remission ([place])   DM2 (diabetes mellitus, type 2) ([facility])   Thrombocytopenia ([place])   Leucopenia   B12 deficiency   B-cell acute lymphoblastic leukemia (ALL) ([place])   Pancytopenia ()   Protein-calorie malnutrition, severe ([place])   Sepsis ([place])   Abnormal liver function   Altered mental status   Malnutrition of moderate degree   Streptococcal bacteremia   Brief Hospital Course (including significant findings, care, treatment, and services provided and events leading to death)  John Singleton is a 58 y.o. year old male with a hx of hypertension, hyperlipidemia, diabetes, B12 deficiency.  Also with multiple myeloma in remission, history bone marrow transplant, He was admitted on Oct 25, 2018 with altered mental status, fever found to have pancytopenia and a lactic acidosis, suspected septic shock.  His last chemotherapy was 09/30/2018.  His evaluation revealed an E faecalis UTI and a strep mitis bacteremia.  He was treated with appropriate antibiotics.  Unfortunately despite aggressive care he experienced some progressive decline, worsening encephalopathy, poor airway protection such that he was evolving respiratory failure.  Discussions were undertaken with the patient's family regarding his overall prognosis and based on this as well as his goals for his care decisiomn was made to defer intubation or escalation of care. Comfort care  measures were initiated and he died on 10-30-18.     Pertinent Labs and Studies  Significant Diagnostic Studies Ct Head Wo Contrast  Result Date: 10/05/2018 CLINICAL DATA:  Altered mental status, sepsis EXAM: CT HEAD WITHOUT CONTRAST TECHNIQUE: Contiguous axial images were obtained from the base of the skull through the vertex without intravenous contrast. COMPARISON:  None. FINDINGS: Brain: There is no mass, hemorrhage or extra-axial collection. The size and configuration of the ventricles and extra-axial CSF spaces are normal. The brain parenchyma is normal, without acute or chronic infarction. Vascular: No abnormal hyperdensity of the major intracranial arteries or dural venous sinuses. No intracranial atherosclerosis. Skull: The visualized skull base, calvarium and extracranial soft tissues are normal. Sinuses/Orbits: No fluid levels or advanced mucosal thickening of the visualized paranasal sinuses. No mastoid or middle ear effusion. The orbits are normal. IMPRESSION: Normal head CT. Electronically Signed   By: Ulyses Jarred M.D.   On: 10/05/2018 00:56   Dg Chest Port 1 View  Result Date: 10/08/2018 CLINICAL DATA:  Respiratory failure EXAM: PORTABLE CHEST 1 VIEW COMPARISON:  10-25-2018 FINDINGS: Interval placement of a partially imaged enteric feeding tube, positioned with tip below the diaphragm although not imaged. No change in low volume appearance of the chest, including a somewhat nodular opacity projecting over the peripheral left midlung. No new airspace opacity. Right chest port catheter. IMPRESSION: 1. Interval placement of a partially imaged enteric feeding tube, positioned with tip below the diaphragm although not imaged. 2. No change in low volume appearance of the chest, including a somewhat nodular opacity projecting over the peripheral left midlung. No new airspace opacity. Electronically Signed   By: Dorna Bloom.D.  On: 10/08/2018 10:41   Dg Chest Port 1 View  Result Date:  09/28/2018 CLINICAL DATA:  Altered mental status. EXAM: PORTABLE CHEST 1 VIEW COMPARISON:  Radiograph 06/12/2017 FINDINGS: Accessed right chest port with tip in the lower SVC. Heart is normal in size. Unchanged mediastinal contours. Mild central vascular congestion. Questionable peripheral patchy opacity in the left mid lung. No pleural effusion or pneumothorax. No acute osseous abnormalities are seen. Remote bilateral rib fractures. IMPRESSION: 1. Questionable peripheral patchy opacity in the left mid lung. 2. Mild vascular congestion. Electronically Signed   By: Keith Rake M.D.   On: 10/05/2018 22:52   US Abdomen Limited Ruq  Result Date: 10/05/2018 CLINICAL DATA:  58 year old male with history of right upper quadrant abdominal pain. 40 pound weight loss in the past 4 weeks. EXAM: ULTRASOUND ABDOMEN LIMITED RIGHT UPPER QUADRANT COMPARISON:  Abdominal ultrasound 05/17/2011. FINDINGS: Gallbladder: Gallbladder wall is markedly thickened measuring 8.6 mm. Gallbladder is not significantly distended. Some amorphous mildly echogenic material with no posterior acoustic shadowing within the gallbladder lumen, compatible with biliary sludge. No evidence of gallstones. Pericholecystic fluid (nonspecific in the setting of ascites). Common bile duct: Diameter: 2.4 mm. Liver: Liver has a nodular contour indicative of underlying cirrhosis. Portal vein is patent on color Doppler imaging with normal direction of blood flow towards the liver. Other: Moderate volume of ascites. IMPRESSION: 1. Cirrhosis with moderate volume of ascites. 2. Biliary sludge in the gallbladder, with gallbladder wall thickening. However, the gallbladder is not distended and there was no sonographic Murphy's sign on examination, suggesting against an acute cholecystitis. Electronically Signed   By: Vinnie Langton M.D.   On: 10/05/2018 11:14     Collene Gobble 11/01/2018, 6:00 PM
